# Patient Record
Sex: Female | Born: 1980 | Race: Black or African American | Hispanic: No | Marital: Single | Smoking: Never smoker
Health system: Southern US, Community
[De-identification: ages and names within clinical notes are randomized; demographics above are authoritative.]

## PROBLEM LIST (undated history)

## (undated) DIAGNOSIS — G894 Chronic pain syndrome: Secondary | ICD-10-CM

## (undated) DIAGNOSIS — G709 Myoneural disorder, unspecified: Secondary | ICD-10-CM

## (undated) DIAGNOSIS — E559 Vitamin D deficiency, unspecified: Secondary | ICD-10-CM

## (undated) DIAGNOSIS — E282 Polycystic ovarian syndrome: Secondary | ICD-10-CM

## (undated) DIAGNOSIS — F419 Anxiety disorder, unspecified: Secondary | ICD-10-CM

## (undated) DIAGNOSIS — R569 Unspecified convulsions: Secondary | ICD-10-CM

## (undated) DIAGNOSIS — F4310 Post-traumatic stress disorder, unspecified: Secondary | ICD-10-CM

## (undated) DIAGNOSIS — G43909 Migraine, unspecified, not intractable, without status migrainosus: Secondary | ICD-10-CM

## (undated) DIAGNOSIS — G4730 Sleep apnea, unspecified: Secondary | ICD-10-CM

## (undated) DIAGNOSIS — K219 Gastro-esophageal reflux disease without esophagitis: Secondary | ICD-10-CM

## (undated) DIAGNOSIS — D689 Coagulation defect, unspecified: Secondary | ICD-10-CM

## (undated) DIAGNOSIS — T7840XA Allergy, unspecified, initial encounter: Secondary | ICD-10-CM

## (undated) DIAGNOSIS — J329 Chronic sinusitis, unspecified: Secondary | ICD-10-CM

## (undated) HISTORY — DX: Chronic pain syndrome: G89.4

## (undated) HISTORY — DX: Sleep apnea, unspecified: G47.30

## (undated) HISTORY — DX: Post-traumatic stress disorder, unspecified: F43.10

## (undated) HISTORY — DX: Polycystic ovarian syndrome: E28.2

## (undated) HISTORY — DX: Coagulation defect, unspecified: D68.9

## (undated) HISTORY — DX: Unspecified convulsions: R56.9

## (undated) HISTORY — DX: Myoneural disorder, unspecified: G70.9

## (undated) HISTORY — DX: Anxiety disorder, unspecified: F41.9

## (undated) HISTORY — DX: Gastro-esophageal reflux disease without esophagitis: K21.9

## (undated) HISTORY — PX: WISDOM TOOTH EXTRACTION: SHX21

## (undated) HISTORY — DX: Allergy, unspecified, initial encounter: T78.40XA

## (undated) HISTORY — PX: EXPLORATORY LAPAROTOMY: SUR591

---

## 2008-07-26 ENCOUNTER — Emergency Department (HOSPITAL_COMMUNITY): Admission: EM | Admit: 2008-07-26 | Discharge: 2008-07-27 | Payer: Self-pay | Admitting: Emergency Medicine

## 2009-08-30 ENCOUNTER — Emergency Department (HOSPITAL_COMMUNITY): Admission: EM | Admit: 2009-08-30 | Discharge: 2009-08-30 | Payer: Self-pay | Admitting: Emergency Medicine

## 2010-05-01 LAB — WET PREP, GENITAL
Trich, Wet Prep: NONE SEEN
Yeast Wet Prep HPF POC: NONE SEEN

## 2010-05-01 LAB — URINALYSIS, ROUTINE W REFLEX MICROSCOPIC
Glucose, UA: NEGATIVE mg/dL
Hgb urine dipstick: NEGATIVE
Ketones, ur: NEGATIVE mg/dL
Nitrite: NEGATIVE
Specific Gravity, Urine: 1.021 (ref 1.005–1.030)
Urobilinogen, UA: 0.2 mg/dL (ref 0.0–1.0)
pH: 7 (ref 5.0–8.0)

## 2010-05-01 LAB — DIFFERENTIAL
Basophils Absolute: 0 10*3/uL (ref 0.0–0.1)
Basophils Relative: 1 % (ref 0–1)
Eosinophils Absolute: 0.2 10*3/uL (ref 0.0–0.7)
Neutrophils Relative %: 55 % (ref 43–77)

## 2010-05-01 LAB — POCT PREGNANCY, URINE: Preg Test, Ur: NEGATIVE

## 2010-05-01 LAB — COMPREHENSIVE METABOLIC PANEL
AST: 23 U/L (ref 0–37)
Alkaline Phosphatase: 97 U/L (ref 39–117)
CO2: 26 mEq/L (ref 19–32)
Creatinine, Ser: 0.75 mg/dL (ref 0.4–1.2)
GFR calc Af Amer: >60 mL/min (ref >60)
Total Bilirubin: 0.6 mg/dL (ref 0.3–1.2)
Total Protein: 7.4 g/dL (ref 6.0–8.3)

## 2010-05-01 LAB — CBC
Hemoglobin: 13.5 g/dL (ref 12.0–15.0)
MCV: 87.4 fL (ref 78.0–100.0)
RBC: 4.44 MIL/uL (ref 3.87–5.11)
WBC: 7.2 10*3/uL (ref 4.0–10.5)

## 2010-05-01 LAB — GC/CHLAMYDIA PROBE AMP, GENITAL: GC Probe Amp, Genital: NEGATIVE

## 2012-09-12 DIAGNOSIS — R079 Chest pain, unspecified: Secondary | ICD-10-CM | POA: Insufficient documentation

## 2012-11-27 DIAGNOSIS — E559 Vitamin D deficiency, unspecified: Secondary | ICD-10-CM | POA: Insufficient documentation

## 2013-01-27 DIAGNOSIS — J309 Allergic rhinitis, unspecified: Secondary | ICD-10-CM | POA: Insufficient documentation

## 2013-08-12 DIAGNOSIS — K2950 Unspecified chronic gastritis without bleeding: Secondary | ICD-10-CM | POA: Insufficient documentation

## 2013-11-27 DIAGNOSIS — I951 Orthostatic hypotension: Secondary | ICD-10-CM | POA: Insufficient documentation

## 2014-06-29 DIAGNOSIS — M2550 Pain in unspecified joint: Secondary | ICD-10-CM | POA: Insufficient documentation

## 2014-06-29 DIAGNOSIS — G8929 Other chronic pain: Secondary | ICD-10-CM | POA: Insufficient documentation

## 2014-06-29 DIAGNOSIS — M25511 Pain in right shoulder: Secondary | ICD-10-CM | POA: Insufficient documentation

## 2014-06-29 DIAGNOSIS — M25562 Pain in left knee: Secondary | ICD-10-CM | POA: Insufficient documentation

## 2014-06-29 DIAGNOSIS — M545 Low back pain, unspecified: Secondary | ICD-10-CM | POA: Insufficient documentation

## 2014-07-13 DIAGNOSIS — G4769 Other sleep related movement disorders: Secondary | ICD-10-CM | POA: Insufficient documentation

## 2014-08-07 DIAGNOSIS — M79604 Pain in right leg: Secondary | ICD-10-CM | POA: Insufficient documentation

## 2014-08-07 DIAGNOSIS — M79605 Pain in left leg: Secondary | ICD-10-CM | POA: Insufficient documentation

## 2015-01-12 DIAGNOSIS — R200 Anesthesia of skin: Secondary | ICD-10-CM | POA: Insufficient documentation

## 2015-01-12 DIAGNOSIS — M542 Cervicalgia: Secondary | ICD-10-CM | POA: Insufficient documentation

## 2015-12-09 DIAGNOSIS — E282 Polycystic ovarian syndrome: Secondary | ICD-10-CM | POA: Insufficient documentation

## 2017-05-02 ENCOUNTER — Ambulatory Visit: Payer: BLUE CROSS/BLUE SHIELD | Admitting: Neurology

## 2017-05-30 DIAGNOSIS — R419 Unspecified symptoms and signs involving cognitive functions and awareness: Secondary | ICD-10-CM | POA: Insufficient documentation

## 2017-07-04 ENCOUNTER — Ambulatory Visit: Payer: BLUE CROSS/BLUE SHIELD | Admitting: Neurology

## 2018-03-06 ENCOUNTER — Ambulatory Visit
Admission: EM | Admit: 2018-03-06 | Discharge: 2018-03-06 | Disposition: A | Discharge status: Home / Self Care | Payer: BLUE CROSS/BLUE SHIELD | Attending: Family Medicine | Admitting: Family Medicine

## 2018-03-06 ENCOUNTER — Encounter: Payer: Self-pay | Admitting: Emergency Medicine

## 2018-03-06 DIAGNOSIS — R0982 Postnasal drip: Secondary | ICD-10-CM

## 2018-03-06 DIAGNOSIS — R0981 Nasal congestion: Secondary | ICD-10-CM

## 2018-03-06 DIAGNOSIS — R059 Cough, unspecified: Secondary | ICD-10-CM

## 2018-03-06 DIAGNOSIS — R5383 Other fatigue: Secondary | ICD-10-CM

## 2018-03-06 DIAGNOSIS — R05 Cough: Secondary | ICD-10-CM | POA: Diagnosis not present

## 2018-03-06 DIAGNOSIS — R112 Nausea with vomiting, unspecified: Secondary | ICD-10-CM | POA: Diagnosis not present

## 2018-03-06 DIAGNOSIS — J111 Influenza due to unidentified influenza virus with other respiratory manifestations: Secondary | ICD-10-CM

## 2018-03-06 DIAGNOSIS — R52 Pain, unspecified: Secondary | ICD-10-CM

## 2018-03-06 DIAGNOSIS — R69 Illness, unspecified: Principal | ICD-10-CM

## 2018-03-06 HISTORY — DX: Chronic sinusitis, unspecified: J32.9

## 2018-03-06 HISTORY — DX: Vitamin D deficiency, unspecified: E55.9

## 2018-03-06 HISTORY — DX: Migraine, unspecified, not intractable, without status migrainosus: G43.909

## 2018-03-06 MED ORDER — HYDROCODONE-HOMATROPINE 5-1.5 MG/5ML PO SYRP: 5.0000 mL | ORAL_SOLUTION | Freq: Four times a day (QID) | ORAL | 0 refills | Status: DC | PRN

## 2018-03-06 MED ORDER — ONDANSETRON 4 MG PO TBDP: 4.0000 mg | ORAL_TABLET | Freq: Three times a day (TID) | ORAL | 0 refills | Status: DC | PRN

## 2018-03-06 NOTE — ED Triage Notes (Signed)
Pt presents to [facility] for assessment of cough, congestion since Sunday, developing nausea and emesis Monday, and today developing cold chills.  Pt c/o throat pain as well.

## 2018-03-06 NOTE — Discharge Instructions (Signed)

## 2018-03-06 NOTE — ED Provider Notes (Signed)
[facility]   [id number] 03/06/18 Arrival Time: 1235  ASSESSMENT & PLAN:  1. Influenza-like illness   2. Cough   3. Nausea and vomiting, intractability of vomiting not specified, unspecified vomiting type    See AVS for discharge instructions.  Meds ordered this encounter  Medications  . HYDROcodone-homatropine (HYCODAN) 5-1.5 MG/5ML syrup    Sig: Take 5 mLs by mouth every 6 (six) hours as needed for cough.    Dispense:  90 mL    Refill:  0  . ondansetron (ZOFRAN-ODT) 4 MG disintegrating tablet    Sig: Take 1 tablet (4 mg total) by mouth every 8 (eight) hours as needed for nausea or vomiting.    Dispense:  15 tablet    Refill:  0   Work note provided. Cough medication sedation precautions. Discussed typical duration of symptoms. OTC symptom care as needed. Ensure adequate fluid intake and rest. May f/u with PCP or here as needed.  Reviewed expectations re: course of current medical issues. Questions answered. Outlined signs and symptoms indicating need for more acute intervention. Patient verbalized understanding. After Visit Summary given.   SUBJECTIVE: History from: patient.  Theresa Phillips is a 38 y.o. female who presents with complaint of nasal congestion, post-nasal drainage, and a persistent dry cough; with a mild sore throat. Onset abrupt, 3-4 days ago; with fatigue and with body aches. SOB: none. Wheezing: none. Fever: questions subjective with chills. Overall normal PO intake without n/v. Known sick contacts: no; but has flown recently No specific or significant aggravating or alleviating factors reported. OTC treatment: none reported.   Social History   Tobacco Use  Smoking Status Never Smoker  Smokeless Tobacco Never Used    ROS: As per HPI.  OBJECTIVE:  Vitals:   03/06/18 1244  BP: 126/84  Pulse: (!) 106  Resp: 20  Temp: 98 F (36.7 C)  TempSrc: Oral  SpO2: 96%     General appearance: alert; appears fatigued HEENT: nasal  congestion; clear runny nose; throat irritation secondary to post-nasal drainage Neck: supple without LAD CV: regular; mild tachycardia Lungs: unlabored respirations, symmetrical air entry without wheezing; cough: moderate Abd: soft Ext: no LE edema Skin: warm and dry Psychological: alert and cooperative; normal mood and affect   Allergies  Allergen Reactions  . Clindamycin/Lincomycin Itching  . Oxycodone Itching  . Tylenol [Acetaminophen] Other (See Comments)    Gives me "migraines".  States she can take it mixed with hydrocodone.    Marland Kitchen. Penicillins Rash  . Tramadol Rash    Past Medical History:  Diagnosis Date  . Migraines   . Sinusitis   . Vitamin D deficiency     Social History   Socioeconomic History  . Marital status: Single    Spouse name: Not on file  . Number of children: Not on file  . Years of education: Not on file  . Highest education level: Not on file  Occupational History  . Not on file  Social Needs  . Financial resource strain: Not on file  . Food insecurity:    Worry: Not on file    Inability: Not on file  . Transportation needs:    Medical: Not on file    Non-medical: Not on file  Tobacco Use  . Smoking status: Never Smoker  . Smokeless tobacco: Never Used  Substance and Sexual Activity  . Alcohol use: Not Currently  . Drug use: Never  . Sexual activity: Not on file  Lifestyle  . Physical activity:  Days per week: Not on file    Minutes per session: Not on file  . Stress: Not on file  Relationships  . Social connections:    Talks on phone: Not on file    Gets together: Not on file    Attends religious service: Not on file    Active member of club or organization: Not on file    Attends meetings of clubs or organizations: Not on file    Relationship status: Not on file  . Intimate partner violence:    Fear of current or ex partner: Not on file    Emotionally abused: Not on file    Physically abused: Not on file    Forced sexual  activity: Not on file  Other Topics Concern  . Not on file  Social History Narrative  . Not on file           Mardella Layman, MD 03/06/18 [phone]

## 2018-03-06 NOTE — ED Notes (Signed)
Patient able to ambulate independently  

## 2018-04-07 DIAGNOSIS — E66813 Obesity, class 3: Secondary | ICD-10-CM | POA: Insufficient documentation

## 2019-02-13 DIAGNOSIS — M654 Radial styloid tenosynovitis [de Quervain]: Secondary | ICD-10-CM | POA: Insufficient documentation

## 2019-02-13 DIAGNOSIS — G5603 Carpal tunnel syndrome, bilateral upper limbs: Secondary | ICD-10-CM | POA: Insufficient documentation

## 2019-02-26 DIAGNOSIS — R251 Tremor, unspecified: Secondary | ICD-10-CM | POA: Insufficient documentation

## 2019-02-28 ENCOUNTER — Other Ambulatory Visit: Payer: Self-pay

## 2019-02-28 ENCOUNTER — Other Ambulatory Visit: Payer: Self-pay | Admitting: Physician Assistant

## 2019-02-28 ENCOUNTER — Ambulatory Visit (INDEPENDENT_AMBULATORY_CARE_PROVIDER_SITE_OTHER): Payer: 59

## 2019-02-28 ENCOUNTER — Ambulatory Visit (INDEPENDENT_AMBULATORY_CARE_PROVIDER_SITE_OTHER): Payer: 59 | Admitting: Internal Medicine

## 2019-02-28 ENCOUNTER — Encounter: Payer: Self-pay | Admitting: Internal Medicine

## 2019-02-28 DIAGNOSIS — G4733 Obstructive sleep apnea (adult) (pediatric): Secondary | ICD-10-CM | POA: Diagnosis not present

## 2019-02-28 DIAGNOSIS — R0609 Other forms of dyspnea: Secondary | ICD-10-CM

## 2019-02-28 DIAGNOSIS — R102 Pelvic and perineal pain: Secondary | ICD-10-CM

## 2019-02-28 DIAGNOSIS — R0600 Dyspnea, unspecified: Secondary | ICD-10-CM

## 2019-02-28 DIAGNOSIS — R058 Other specified cough: Secondary | ICD-10-CM

## 2019-02-28 DIAGNOSIS — R05 Cough: Secondary | ICD-10-CM

## 2019-02-28 DIAGNOSIS — Z9989 Dependence on other enabling machines and devices: Secondary | ICD-10-CM

## 2019-02-28 NOTE — Progress Notes (Addendum)
Theresa Phillips, female    DOB: August 10, 1980,   MRN: [id number]   Brief patient profile:  39 yobf born 3 weeks early at 9lb 13 oz but  good ex tol as child, teenager but "always allergy" = Itching sneezing runny nose never allergist rx clariton/allegra always about the same but worse in summer very sedentary and limited by back pain and doe around 2017.   2016 while at conference roommate noted she stopped breathing > neurologist  In [place] >rx cpap feels more rested since started but breathing no better     History of Present Illness  02/28/2019  Pulmonary/ 1st office eval/Ghadeer Kastelic  Chief Complaint  Patient presents with  . Consult    Patient is here for shortness of breath with exertion and daily activies that has got worse over the last year. Patient has sleep apnea. Patient was told befoer she has a touch of asthma but was never really confimed and wasn't treated.   Dyspnea:  MMRC3 = can't walk 100 yards even at a slow pace at a flat grade s stopping due to sob / eg [employer] can just pick up 1-2 items, not shop entire store or carry bags and walk if over 10 lb  Cough: sometimes with ex / dry Sleep: better on cpap, no resp c/o's SABA use: no  No obvious day to day or daytime variability or assoc excess/ purulent sputum or mucus plugs or hemoptysis or cp or chest tightness, subjective wheeze or overt sinus or hb symptoms.   Sleeping as above  without nocturnal  or early am exacerbation  of respiratory  c/o's or need for noct saba. Also denies any obvious fluctuation of symptoms with weather or environmental changes or other aggravating or alleviating factors except as outlined above   No unusual exposure hx or h/o childhood pna/ asthma or knowledge of premature birth.  Current Allergies, Complete Past Medical History, Past Surgical History, Family History, and Social History were reviewed in [employer] record.  ROS  The following are not active complaints unless  bolded Hoarseness, sore throat, dysphagia, dental problems, itching, sneezing,  nasal congestion or discharge of excess mucus or purulent secretions, ear ache,   fever, chills, sweats, unintended wt loss or wt gain, classically pleuritic or exertional cp,  orthopnea pnd or arm/hand swelling  or leg swelling, presyncope, palpitations, abdominal pain, anorexia, nausea, vomiting, diarrhea  or change in bowel habits or change in bladder habits, change in stools or change in urine, dysuria, hematuria,  rash, arthralgias, visual complaints, headache, numbness, weakness or ataxia or problems with walking or coordination,  change in mood or  memory.             Past Medical History:  Diagnosis Date  . Migraines   . Sinusitis   . Vitamin D deficiency     Outpatient Medications Prior to Visit  Medication Sig Dispense Refill  . eltrombopag (PROMACTA) 12.5 MG tablet Take 12.5 mg by mouth as needed. Take on an empty stomach, 1 hour before a meal or 2 hours after.    . ergocalciferol (VITAMIN D2) 1.25 MG (50000 UT) capsule Take 1 capsule by mouth once a week.    . fluticasone (FLONASE) 50 MCG/ACT nasal spray Place 2 sprays into both nostrils daily.    Marland Kitchen HYDROcodone-homatropine (HYCODAN) 5-1.5 MG/5ML syrup Take 5 mLs by mouth every 6 (six) hours as needed for cough. 90 mL 0  . levocetirizine (XYZAL) 5 MG tablet Take 5 mg  by mouth daily.    . norgestrel-ethinyl estradiol (LO/OVRAL) 0.3-30 MG-MCG tablet Take 1 tablet by mouth daily.    Marland Kitchen omeprazole (PRILOSEC) 40 MG capsule Take 40 mg by mouth daily.    . ondansetron (ZOFRAN-ODT) 4 MG disintegrating tablet Take 1 tablet (4 mg total) by mouth every 8 (eight) hours as needed for nausea or vomiting. 15 tablet 0  . RIMEGEPANT SULFATE PO Take 1 tablet by mouth as needed.    . topiramate (TOPAMAX) 50 MG tablet Take 50 mg by mouth 2 (two) times daily. Patient is on incline to increase to 14m. Started 02/18/2019    . amitriptyline (ELAVIL) 50 MG tablet Take 50 mg  by mouth at bedtime.        Objective:     BP 110/80 (BP Location: Left Arm, Cuff Size: Large)   Pulse 89   SpO2 98% Comment: on RA  SpO2: 98 %(on RA)   Obese bf nad    HEENT : pt wearing mask not removed for exam due to covid -19 concerns.    NECK :  without JVD/Nodes/TM/ nl carotid upstrokes bilaterally   LUNGS: no acc muscle use,  Nl contour chest which is clear to A and P bilaterally without cough on insp or exp maneuvers   CV:  RRR  no s3 or murmur or increase in P2, and no edema   ABD:  Obese soft and nontender with very poor  inspiratory excursion in the supine position. No bruits or organomegaly appreciated, bowel sounds nl  MS:  Nl gait/ ext warm without deformities, calf tenderness, cyanosis or clubbing No obvious joint restrictions   SKIN: warm and dry without lesions    NEURO:  alert, approp, nl sensorium with  no motor or cerebellar deficits apparent.   Labs  [facility] blood work one week prior to OV  > requested  Add:  Labs 02/19/19  TSH 2.2,  ESR 36  - no cbc or bmet    CXR PA and Lateral:   02/28/2019 :    I personally reviewed images and agree with radiology impression as follows:    No active cardiopulmonary disease.      Assessment   DOE (dyspnea on exertion) Onset 2017  - 02/28/2019   Walked RA x two laps =  approx 5046f@ slow pace - stopped due to end of study, min sob  with sats of 100 % at the end of the study.  Symptoms are markedly disproportionate to objective findings and not clear to what extent this is actually a pulmonary  problem but pt does appear to have difficult to sort out respiratory symptoms of unknown origin for which  DDX  = almost all start with A and  include Adherence, Ace Inhibitors, Acid Reflux, Active Sinus Disease, Alpha 1 Antitripsin deficiency, Anxiety masquerading as Airways dz,  ABPA,  Allergy(esp in young), Aspiration (esp in elderly), Adverse effects of meds,  Active smoking or Vaping, A bunch of PE's/clot burden (a  few small clots can't cause this syndrome unless there is already severe underlying pulm or vascular dz with poor reserve),  Anemia or thyroid disorder, plus two Bs  = Bronchiectasis and Beta blocker use..and one C= CHF     Adherence is always the initial "prime suspect" and is a multilayered concern that requires a "trust but verify" approach in every patient - starting with knowing how to use medications, especially inhalers, correctly, keeping up with refills and understanding the fundamental difference between maintenance  and prns vs those medications only taken for a very short course and then stopped and not refilled.  - return with all meds in hand using a trust but verify approach to confirm accurate Medication  Reconciliation The principal here is that until we are certain that the  patients are doing what we've asked, it makes no sense to ask them to do more.   ? Acid (or non-acid) GERD > always difficult to exclude as up to 75% of pts in some series report no assoc GI/ Heartburn symptoms and she is on progesterone/ MO which can synergistically overcome nl LES fxn > rec max (24h)  acid suppression and diet restrictions/ reviewed and instructions given in writing.   ? Anxiety/depression/ deconditioning  > usually at the bottom of  list of usual suspects but   may interfere with adherence and also interpretation of response or lack thereof to symptom management which can be quite subjective.  >>> reconditioning ex discussed   ? Active sinus dz/ rhinitis > continue flonase/ xyzal > consider formal allergy eval but return with pfts first.  ? Adverse drug effects:  bcp's at age 39 risk dvt/GERD/ advised   ? A bunch of PE's > at risk though nothing clinically to suggest    >>> Will return in 3 months with pfts, call sooner if any acute changes         Upper airway cough syndrome/ PNDS ? allergic rhinitis  Onset as teenager, partially responsive to otcs  Upper airway cough syndrome  (previously labeled PNDS),  is so named because it's frequently impossible to sort out how much is  CR/sinusitis with freq throat clearing (which can be related to primary GERD)   vs  causing  secondary (" extra esophageal")  GERD from wide swings in gastric pressure that occur with throat clearing, often  promoting self use of mint and menthol lozenges that reduce the lower esophageal sphincter tone and exacerbate the problem further in a cyclical fashion.   These are the same pts (now being labeled as having "irritable larynx syndrome" by some cough centers) who not infrequently have a history of having failed to tolerate ace inhibitors,  dry powder inhalers or biphosphonates or report having atypical/extraesophageal reflux symptoms that don't respond to standard doses of PPI  and are easily confused as having aecopd or asthma flares by even experienced allergists/ pulmonologists (myself included).    >>>> No change rx for now, rx GERD and then consider allergy eval next    OSA on CPAP rx per [facility] neurologist since 2016   Etiology and pathophysiology of osa including relationship to obesity reviewed in detail    Appears to be well compensated but explained the problem with wt on top of diaphragm  >>>>rec  would be better if used bed blocks as well as continue cpap/ wt loss    Morbid obesity due to excess calories c/b OSA Body mass index is 47.4 kg/m.    TSH  02/19/19  = 2.2   Contributing to gerd risk/ doe/reviewed the need and the process to achieve and maintain neg calorie balance > defer f/u primary care including intermittently monitoring thyroid status      Total time devoted to counseling  > 50 % of initial 60 min office visit:  reviewed case with pt/ directly observd  portions of ambulatory 02 saturation study/  discussion of options/alternatives/ personally creating written customized instructions  in presence of pt  then going over those specific  Instructions directly with  the pt including how to use all of the meds but in particular covering each new medication in detail and the difference between the maintenance= "automatic" meds and the prns using an action plan format for the latter (If this problem/symptom => do that organization reading Left to right).  Please see AVS from this visit for a full list of these instructions which I personally wrote for this pt and  are unique to this visit.    Christinia Gully, MD 02/28/2019

## 2019-02-28 NOTE — Patient Instructions (Signed)
GERD (REFLUX)  is an extremely common cause of respiratory symptoms just like yours , many times with no obvious heartburn at all.    It can be treated with medication, but also with lifestyle changes including elevation of the head of your bed (ideally with 6 -8inch blocks under the headboard of your bed),  Smoking cessation, avoidance of late meals, excessive alcohol, and avoid fatty foods, chocolate, peppermint, colas, red wine, and acidic juices such as orange juice.  NO MINT OR MENTHOL PRODUCTS SO NO COUGH DROPS  USE SUGARLESS CANDY INSTEAD (Jolley ranchers or Stover's or Life Savers) or even ice chips will also do - the key is to swallow to prevent all throat clearing. NO OIL BASED VITAMINS - use powdered substitutes.  Avoid fish oil when coughing.    Please remember to go to the  x-ray department  for your tests - we will call you with the results when they are available     Please schedule a follow up visit in 3 months but call sooner if needed - pft's on return

## 2019-03-01 ENCOUNTER — Encounter: Payer: Self-pay | Admitting: Internal Medicine

## 2019-03-01 DIAGNOSIS — R058 Other specified cough: Secondary | ICD-10-CM | POA: Insufficient documentation

## 2019-03-01 DIAGNOSIS — Z9989 Dependence on other enabling machines and devices: Secondary | ICD-10-CM | POA: Insufficient documentation

## 2019-03-01 DIAGNOSIS — R05 Cough: Secondary | ICD-10-CM | POA: Insufficient documentation

## 2019-03-01 DIAGNOSIS — G4733 Obstructive sleep apnea (adult) (pediatric): Secondary | ICD-10-CM | POA: Insufficient documentation

## 2019-03-01 NOTE — Assessment & Plan Note (Signed)
Onset as teenager, partially responsive to otcs  Upper airway cough syndrome (previously labeled PNDS),  is so named because it's frequently impossible to sort out how much is  CR/sinusitis with freq throat clearing (which can be related to primary GERD)   vs  causing  secondary (" extra esophageal")  GERD from wide swings in gastric pressure that occur with throat clearing, often  promoting self use of mint and menthol lozenges that reduce the lower esophageal sphincter tone and exacerbate the problem further in a cyclical fashion.   These are the same pts (now being labeled as having "irritable larynx syndrome" by some cough centers) who not infrequently have a history of having failed to tolerate ace inhibitors,  dry powder inhalers or biphosphonates or report having atypical/extraesophageal reflux symptoms that don't respond to standard doses of PPI  and are easily confused as having aecopd or asthma flares by even experienced allergists/ pulmonologists (myself included).    No change rx for now, rx GERD and then consider allergy eval next

## 2019-03-01 NOTE — Assessment & Plan Note (Addendum)
rx per [facility] neurologist since 2016   Etiology and pathophysiology of osa including relationship to obesity reviewed in detail    Appears to be well compensated but explained the problem with wt on top of diaphragm  >>>>rec  would be better if used bed blocks as well as continue cpap/ wt loss   Total time devoted to counseling  > 50 % of initial 60 min office visit:  reviewed case with pt/ directly observd  portions of ambulatory 02 saturation study/  discussion of options/alternatives/ personally creating written customized instructions  in presence of pt  then going over those specific  Instructions directly with the pt including how to use all of the meds but in particular covering each new medication in detail and the difference between the maintenance= "automatic" meds and the prns using an action plan format for the latter (If this problem/symptom => do that organization reading Left to right).  Please see AVS from this visit for a full list of these instructions which I personally wrote for this pt and  are unique to this visit.

## 2019-03-01 NOTE — Assessment & Plan Note (Addendum)
Onset 2017  - 02/28/2019   Walked RA x two laps =  approx 564ft @ slow pace - stopped due to end of study, min sob  with sats of 100 % at the end of the study.  Symptoms are markedly disproportionate to objective findings and not clear to what extent this is actually a pulmonary  problem but pt does appear to have difficult to sort out respiratory symptoms of unknown origin for which  DDX  = almost all start with A and  include Adherence, Ace Inhibitors, Acid Reflux, Active Sinus Disease, Alpha 1 Antitripsin deficiency, Anxiety masquerading as Airways dz,  ABPA,  Allergy(esp in young), Aspiration (esp in elderly), Adverse effects of meds,  Active smoking or Vaping, A bunch of PE's/clot burden (a few small clots can't cause this syndrome unless there is already severe underlying pulm or vascular dz with poor reserve),  Anemia or thyroid disorder, plus two Bs  = Bronchiectasis and Beta blocker use..and one C= CHF     Adherence is always the initial "prime suspect" and is a multilayered concern that requires a "trust but verify" approach in every patient - starting with knowing how to use medications, especially inhalers, correctly, keeping up with refills and understanding the fundamental difference between maintenance and prns vs those medications only taken for a very short course and then stopped and not refilled.  - return with all meds in hand using a trust but verify approach to confirm accurate Medication  Reconciliation The principal here is that until we are certain that the  patients are doing what we've asked, it makes no sense to ask them to do more.   ? Acid (or non-acid) GERD > always difficult to exclude as up to 75% of pts in some series report no assoc GI/ Heartburn symptoms and she is on progesterone/ MO which can synergistically overcome nl LES fxn > rec max (24h)  acid suppression and diet restrictions/ reviewed and instructions given in writing.   ? Anxiety/depression/ deconditioning  >  usually at the bottom of  list of usual suspects but   may interfere with adherence and also interpretation of response or lack thereof to symptom management which can be quite subjective.  >>> reconditioning ex discussed   ? Active sinus dz/ rhinitis > continue flonase/ xyzal > consider formal allergy eval but return with pfts first.  ? Adverse drug effects:  bcp's at age 39 risk dvt/GERD/ advised   ? A bunch of PE's > at risk though nothing clinically to suggest    >>> Will return in 3 months with pfts, call sooner if any acute changes

## 2019-03-01 NOTE — Assessment & Plan Note (Addendum)
Body mass index is 47.4 kg/m.    TSH  02/19/19  = 2.2   Contributing to gerd risk/ doe/reviewed the need and the process to achieve and maintain neg calorie balance > defer f/u primary care including intermittently monitoring thyroid status

## 2019-03-03 NOTE — Progress Notes (Signed)
Left detailed msg on VM ok per DPR

## 2019-03-13 ENCOUNTER — Other Ambulatory Visit: Payer: 59

## 2019-03-19 ENCOUNTER — Ambulatory Visit
Admission: RE | Admit: 2019-03-19 | Discharge: 2019-03-19 | Disposition: A | Discharge status: Home / Self Care | Payer: 59 | Source: Ambulatory Visit | Attending: Physician Assistant | Admitting: Physician Assistant

## 2019-03-19 DIAGNOSIS — R102 Pelvic and perineal pain: Secondary | ICD-10-CM

## 2019-03-19 MED ORDER — IOPAMIDOL (ISOVUE-300) INJECTION 61%
100.0000 mL | Freq: Once | INTRAVENOUS | Status: AC | PRN
  Administered 2019-03-19: 14:00:00 100 mL via INTRAVENOUS

## 2019-03-20 DIAGNOSIS — G43909 Migraine, unspecified, not intractable, without status migrainosus: Secondary | ICD-10-CM | POA: Insufficient documentation

## 2019-04-04 DIAGNOSIS — G5601 Carpal tunnel syndrome, right upper limb: Secondary | ICD-10-CM | POA: Insufficient documentation

## 2019-04-04 DIAGNOSIS — G5602 Carpal tunnel syndrome, left upper limb: Secondary | ICD-10-CM | POA: Insufficient documentation

## 2019-04-15 ENCOUNTER — Other Ambulatory Visit: Payer: Self-pay | Admitting: Gastroenterology

## 2019-04-15 DIAGNOSIS — Q451 Annular pancreas: Secondary | ICD-10-CM

## 2019-04-24 ENCOUNTER — Other Ambulatory Visit: Payer: 59

## 2019-05-05 ENCOUNTER — Other Ambulatory Visit: Payer: 59

## 2019-05-05 ENCOUNTER — Other Ambulatory Visit: Payer: Self-pay | Admitting: Gastroenterology

## 2019-05-05 DIAGNOSIS — R109 Unspecified abdominal pain: Secondary | ICD-10-CM

## 2019-05-05 DIAGNOSIS — K219 Gastro-esophageal reflux disease without esophagitis: Secondary | ICD-10-CM

## 2019-05-16 ENCOUNTER — Other Ambulatory Visit (HOSPITAL_COMMUNITY): Payer: Self-pay | Admitting: Gastroenterology

## 2019-05-16 ENCOUNTER — Ambulatory Visit
Admission: RE | Admit: 2019-05-16 | Discharge: 2019-05-16 | Disposition: A | Discharge status: Home / Self Care | Payer: 59 | Source: Ambulatory Visit | Attending: Gastroenterology | Admitting: Gastroenterology

## 2019-05-16 DIAGNOSIS — R109 Unspecified abdominal pain: Secondary | ICD-10-CM

## 2019-05-16 DIAGNOSIS — K219 Gastro-esophageal reflux disease without esophagitis: Secondary | ICD-10-CM

## 2019-05-20 ENCOUNTER — Other Ambulatory Visit (HOSPITAL_COMMUNITY): Payer: Self-pay | Admitting: Gastroenterology

## 2019-05-20 ENCOUNTER — Other Ambulatory Visit: Payer: Self-pay

## 2019-05-20 ENCOUNTER — Ambulatory Visit (HOSPITAL_COMMUNITY)
Admission: RE | Admit: 2019-05-20 | Discharge: 2019-05-20 | Disposition: A | Discharge status: Home / Self Care | Payer: 59 | Source: Ambulatory Visit | Attending: Gastroenterology | Admitting: Gastroenterology

## 2019-05-20 DIAGNOSIS — K219 Gastro-esophageal reflux disease without esophagitis: Secondary | ICD-10-CM | POA: Insufficient documentation

## 2019-05-20 DIAGNOSIS — R109 Unspecified abdominal pain: Secondary | ICD-10-CM | POA: Diagnosis present

## 2019-05-23 ENCOUNTER — Other Ambulatory Visit: Payer: 59

## 2019-05-28 ENCOUNTER — Ambulatory Visit: Payer: 59 | Admitting: Internal Medicine

## 2019-05-30 ENCOUNTER — Ambulatory Visit: Payer: 59 | Admitting: Internal Medicine

## 2019-06-03 ENCOUNTER — Ambulatory Visit: Payer: 59 | Admitting: Internal Medicine

## 2019-06-16 ENCOUNTER — Other Ambulatory Visit: Payer: Self-pay | Admitting: Gastroenterology

## 2019-06-16 ENCOUNTER — Other Ambulatory Visit (HOSPITAL_COMMUNITY): Payer: Self-pay | Admitting: Gastroenterology

## 2019-06-16 DIAGNOSIS — R1111 Vomiting without nausea: Secondary | ICD-10-CM

## 2019-06-26 ENCOUNTER — Encounter (HOSPITAL_COMMUNITY)
Admission: RE | Admit: 2019-06-26 | Discharge: 2019-06-26 | Disposition: A | Discharge status: Home / Self Care | Payer: 59 | Source: Ambulatory Visit | Attending: Gastroenterology | Admitting: Gastroenterology

## 2019-06-26 ENCOUNTER — Other Ambulatory Visit: Payer: Self-pay

## 2019-06-26 DIAGNOSIS — R1111 Vomiting without nausea: Secondary | ICD-10-CM

## 2019-06-26 MED ORDER — TECHNETIUM TC 99M SULFUR COLLOID
2.0000 | Freq: Once | INTRAVENOUS | Status: AC | PRN
  Administered 2019-06-26: 2 via ORAL

## 2019-08-27 ENCOUNTER — Ambulatory Visit (INDEPENDENT_AMBULATORY_CARE_PROVIDER_SITE_OTHER): Payer: 59 | Admitting: Psychology

## 2019-08-27 DIAGNOSIS — F411 Generalized anxiety disorder: Secondary | ICD-10-CM | POA: Diagnosis not present

## 2019-09-03 ENCOUNTER — Ambulatory Visit (INDEPENDENT_AMBULATORY_CARE_PROVIDER_SITE_OTHER): Payer: 59 | Admitting: Psychology

## 2019-09-03 DIAGNOSIS — F411 Generalized anxiety disorder: Secondary | ICD-10-CM | POA: Diagnosis not present

## 2019-09-10 ENCOUNTER — Ambulatory Visit: Payer: 59 | Admitting: Psychology

## 2019-10-30 DIAGNOSIS — M5459 Other low back pain: Secondary | ICD-10-CM | POA: Insufficient documentation

## 2019-10-30 DIAGNOSIS — M51369 Other intervertebral disc degeneration, lumbar region without mention of lumbar back pain or lower extremity pain: Secondary | ICD-10-CM | POA: Insufficient documentation

## 2019-10-30 DIAGNOSIS — G894 Chronic pain syndrome: Secondary | ICD-10-CM | POA: Insufficient documentation

## 2019-10-30 DIAGNOSIS — M5416 Radiculopathy, lumbar region: Secondary | ICD-10-CM | POA: Insufficient documentation

## 2019-10-30 DIAGNOSIS — G8929 Other chronic pain: Secondary | ICD-10-CM | POA: Insufficient documentation

## 2019-10-30 DIAGNOSIS — M5136 Other intervertebral disc degeneration, lumbar region: Secondary | ICD-10-CM | POA: Insufficient documentation

## 2019-11-03 DIAGNOSIS — R519 Headache, unspecified: Secondary | ICD-10-CM | POA: Insufficient documentation

## 2019-12-10 DIAGNOSIS — G43711 Chronic migraine without aura, intractable, with status migrainosus: Secondary | ICD-10-CM | POA: Insufficient documentation

## 2020-01-08 ENCOUNTER — Encounter: Payer: Self-pay | Admitting: Podiatry

## 2020-01-08 ENCOUNTER — Ambulatory Visit (INDEPENDENT_AMBULATORY_CARE_PROVIDER_SITE_OTHER): Payer: 59 | Admitting: Podiatry

## 2020-01-08 ENCOUNTER — Other Ambulatory Visit: Payer: Self-pay

## 2020-01-08 DIAGNOSIS — L600 Ingrowing nail: Secondary | ICD-10-CM | POA: Diagnosis not present

## 2020-01-08 DIAGNOSIS — M79676 Pain in unspecified toe(s): Secondary | ICD-10-CM | POA: Diagnosis not present

## 2020-01-08 NOTE — Progress Notes (Signed)
  Subjective:  Patient ID: Theresa Phillips, female    DOB: January 22, 1981,  MRN: [id number]  Chief Complaint  Patient presents with  . Nail Problem    I have some ingrown toenails on the big toes and the 2nd toes     39 y.o. female presents with the above complaint. History confirmed with patient. States these happen often and cause her pain.  Objective:  Physical Exam: warm, good capillary refill, no trophic changes or ulcerative lesions, normal DP and PT pulses and normal sensory exam.  Painful ingrowing nail at both borders of the left, right, hallux; without warmth, erythema or drainage  Assessment:   1. Ingrown nail   2. Pain around toenail    Plan:  Patient was evaluated and treated and all questions answered.  Ingrown Nail, bilateral -Nails debrided in slant-back fashion bilat. -Will plan for ingrown nail procedure in 2 weeks.   Return in about 2 weeks (around 01/22/2020) for Ingrown nail procedure both great toes.

## 2020-01-22 ENCOUNTER — Other Ambulatory Visit: Payer: Self-pay

## 2020-01-22 ENCOUNTER — Encounter: Payer: Self-pay | Admitting: Podiatry

## 2020-01-22 ENCOUNTER — Ambulatory Visit (INDEPENDENT_AMBULATORY_CARE_PROVIDER_SITE_OTHER): Payer: 59 | Admitting: Podiatry

## 2020-01-22 DIAGNOSIS — L600 Ingrowing nail: Secondary | ICD-10-CM | POA: Diagnosis not present

## 2020-01-22 MED ORDER — NEOMYCIN-POLYMYXIN-HC 3.5-10000-1 OT SOLN: OTIC | 0 refills | Status: DC

## 2020-01-22 NOTE — Progress Notes (Signed)
  Subjective:  Patient ID: Theresa Phillips, female    DOB: 1980/02/24,  MRN: [id number]  Chief Complaint  Patient presents with  . Nail Problem    I have some ingrowns that Dr Samuella Cota is going to take out today on the two big toenails     39 y.o. female presents with the above complaint. History confirmed with patient.   Objective:  Physical Exam: warm, good capillary refill, no trophic changes or ulcerative lesions, normal DP and PT pulses and normal sensory exam.  Painful ingrowing nail at both borders of the left, right, hallux, medial border both 2nd toes without warmth, erythema or drainage  Assessment:   1. Ingrown nail      Plan:  Patient was evaluated and treated and all questions answered.  Ingrown Nail, bilateral -Patient elects to proceed with ingrown toenail removal today -Ingrown nail excised. See procedure note. -Educated on post-procedure care including soaking. Written instructions provided. -Rx for Cortisporin drops -Patient to follow up in 2 weeks for nail check.  Procedure: Excision of Ingrown Toenail Location: Bilateral 1st toe both borders, bilat 2nd toe medial borders. Anesthesia: Lidocaine 2% plain; 61mL and Marcaine 0.5% plain; 96mL, digital block. Skin Prep: Betadine. Dressing: Silvadene; telfa; dry, sterile, compression dressing. Technique: Following skin prep, the toe was exsanguinated and a tourniquet was secured at the base of the toe. The affected nail border was freed, split with a nail splitter, and excised. Chemical matrixectomy was then performed with phenol and irrigated out with alcohol. The tourniquet was then removed and sterile dressing applied. Disposition: Patient tolerated procedure well. Patient to return in 2 weeks for follow-up.  Return in about 2 weeks (around 02/05/2020) for Nail Check.

## 2020-01-22 NOTE — Patient Instructions (Signed)

## 2020-02-05 ENCOUNTER — Ambulatory Visit: Payer: 59 | Admitting: Podiatry

## 2020-02-05 ENCOUNTER — Other Ambulatory Visit: Payer: Self-pay

## 2020-02-05 DIAGNOSIS — L600 Ingrowing nail: Secondary | ICD-10-CM | POA: Diagnosis not present

## 2020-02-05 DIAGNOSIS — M79676 Pain in unspecified toe(s): Secondary | ICD-10-CM | POA: Diagnosis not present

## 2020-02-05 NOTE — Progress Notes (Signed)
  Subjective:  Patient ID: Theresa Phillips, female    DOB: Sep 08, 1980,  MRN: [id number]  No chief complaint on file.   40 y.o. female presents for follow up of nail procedure. History confirmed with patient. States the nails are a little tender but denies new issues. Denies warmth, redness, drainage.  Objective:  Physical Exam: Ingrown nail avulsion sites: overlying soft crust, no warmth, no drainage and no erythema Assessment:   1. Ingrown nail   2. Pain around toenail      Plan:  Patient was evaluated and treated and all questions answered.  S/p Ingrown Toenail Excision, bilateral -Healing well without issue. -Gently curettaged nail borders. -Discussed return precautions. -F/u PRN

## 2020-02-17 DIAGNOSIS — M47816 Spondylosis without myelopathy or radiculopathy, lumbar region: Secondary | ICD-10-CM | POA: Insufficient documentation

## 2020-02-20 ENCOUNTER — Telehealth: Payer: Self-pay | Admitting: *Deleted

## 2020-02-20 NOTE — Telephone Encounter (Signed)
The patient called and left a message stating that the left big toe is good and the right big toe has a dark spot and is tender and swelling and some what red and I stated to soak in betadine over the weekend and to call us Monday to let us know how the toe is doing. Misty Stanley

## 2020-03-05 DIAGNOSIS — L83 Acanthosis nigricans: Secondary | ICD-10-CM | POA: Insufficient documentation

## 2020-03-05 DIAGNOSIS — K589 Irritable bowel syndrome without diarrhea: Secondary | ICD-10-CM | POA: Insufficient documentation

## 2020-03-05 DIAGNOSIS — K5900 Constipation, unspecified: Secondary | ICD-10-CM | POA: Insufficient documentation

## 2020-03-05 DIAGNOSIS — M5430 Sciatica, unspecified side: Secondary | ICD-10-CM | POA: Insufficient documentation

## 2020-04-06 ENCOUNTER — Telehealth: Payer: Self-pay | Admitting: *Deleted

## 2020-04-06 NOTE — Telephone Encounter (Signed)
Called and spoke with the patient and stated that patient has an appointment on Thursday march 17th, 2022 at 11:00 am and per Dr Samuella Cota can soak in epsom salt and patient stated that it was a little touchy at times and I stated that patient could try some betadine and use just a little of that and a bad aid and see how that feels until the appointment time. Theresa Phillips

## 2020-04-06 NOTE — Telephone Encounter (Signed)
-----   Message from Park Liter, DPM sent at 04/02/2020  5:08 PM EST ----- Soak it in water and Epsom salt and make an appt to see me ----- Message ----- From: Lanney Gins, [facility] Sent: 04/02/2020   1:08 PM EST To: Park Liter, DPM  Patient is called to see what she can do about the pain in her toenails and patient does have an appointment to see yo. Misty Stanley

## 2020-04-08 ENCOUNTER — Other Ambulatory Visit: Payer: Self-pay

## 2020-04-08 ENCOUNTER — Ambulatory Visit (INDEPENDENT_AMBULATORY_CARE_PROVIDER_SITE_OTHER): Payer: 59 | Admitting: Podiatry

## 2020-04-08 DIAGNOSIS — M79676 Pain in unspecified toe(s): Secondary | ICD-10-CM | POA: Diagnosis not present

## 2020-04-08 DIAGNOSIS — L600 Ingrowing nail: Secondary | ICD-10-CM

## 2020-04-08 NOTE — Progress Notes (Signed)
  Subjective:  Patient ID: Theresa Phillips, female    DOB: [birth year],  MRN: [id number]  No chief complaint on file.   40 y.o. female presents with the above complaint. History confirmed with patient. States the right great toe hurts at both sides, also inside of the right 2nd toe. Both nails have pain on the top when squeezed.   Objective:  Physical Exam: warm, good capillary refill, no trophic changes or ulcerative lesions, normal DP and PT pulses and normal sensory exam.  Painful ingrowing nail at both borders of the right, hallux; medial right 2nd toe without warmth, erythema or drainage  Assessment:   1. Ingrown nail   2. Pain around toenail    Plan:  Patient was evaluated and treated and all questions answered.  Ingrown Nail, right -Palliative debridement of ingrowing nails in slant-back fashion to patient relief.  -Nails should grow out more normally with time.  -F/u should issues persist  Return if symptoms worsen or fail to improve.

## 2020-05-13 DIAGNOSIS — R110 Nausea: Secondary | ICD-10-CM | POA: Insufficient documentation

## 2020-06-09 ENCOUNTER — Other Ambulatory Visit: Payer: Self-pay | Admitting: Gastroenterology

## 2020-06-09 DIAGNOSIS — Q451 Annular pancreas: Secondary | ICD-10-CM

## 2020-06-10 ENCOUNTER — Other Ambulatory Visit: Payer: Self-pay

## 2020-06-10 ENCOUNTER — Encounter: Payer: Self-pay | Admitting: Podiatry

## 2020-06-10 ENCOUNTER — Ambulatory Visit (INDEPENDENT_AMBULATORY_CARE_PROVIDER_SITE_OTHER): Payer: 59 | Admitting: Podiatry

## 2020-06-10 DIAGNOSIS — L600 Ingrowing nail: Secondary | ICD-10-CM

## 2020-06-10 DIAGNOSIS — M79676 Pain in unspecified toe(s): Secondary | ICD-10-CM

## 2020-06-10 NOTE — Progress Notes (Signed)
  Subjective:  Patient ID: Theresa Phillips, female    DOB: 1980-09-05,  MRN: [id number]  Chief Complaint  Patient presents with  . Nail Problem    The right big toe is tender more so on the right side than the left side   40 y.o. female presents with the above complaint. History confirmed with patient.   Objective:  Physical Exam: warm, good capillary refill, no trophic changes or ulcerative lesions, normal DP and PT pulses and normal sensory exam.  Painful ingrowing nail at  medial border of the right, hallux; without warmth, erythema or drainage  Assessment:   1. Pain around toenail   2. Ingrown nail    Plan:  Patient was evaluated and treated and all questions answered.  Ingrown Nail, right -Patient elects to proceed with ingrown toenail removal today -Ingrown nail excised. See procedure note. -Educated on post-procedure care including soaking. Written instructions provided.  Procedure: Excision of Ingrown Toenail Location: Right 1st toe  medial border Anesthesia: Lidocaine 1% plain; 38mL, digital block. Skin Prep: Betadine. Dressing: Silvadene; telfa; dry, sterile, compression dressing. Technique: Following skin prep, the toe was exsanguinated and a tourniquet was secured at the base of the toe. The affected nail border was freed, split with a nail splitter, and excised. Chemical matrixectomy was then performed with phenol and irrigated out with alcohol. The tourniquet was then removed and sterile dressing applied. Disposition: Patient tolerated procedure well. Patient to return in 2 weeks for follow-up.  Return in about 2 weeks (around 06/24/2020) for nail check.

## 2020-06-10 NOTE — Patient Instructions (Signed)

## 2020-06-28 ENCOUNTER — Other Ambulatory Visit: Payer: Self-pay

## 2020-06-28 ENCOUNTER — Ambulatory Visit (INDEPENDENT_AMBULATORY_CARE_PROVIDER_SITE_OTHER): Payer: 59 | Admitting: Podiatry

## 2020-06-28 DIAGNOSIS — M79676 Pain in unspecified toe(s): Secondary | ICD-10-CM

## 2020-06-28 DIAGNOSIS — L600 Ingrowing nail: Secondary | ICD-10-CM | POA: Diagnosis not present

## 2020-06-28 NOTE — Progress Notes (Signed)
  Subjective:  Patient ID: Theresa Phillips, female    DOB: 07-28-1980,  MRN: [id number]  Chief Complaint  Patient presents with  . nail check    F/U Rt hallux nail check -pt states," looking better but it hurts a lot at times; 10/10 at times or 3/10 ." - no redness/swelling/drainage     40 y.o. female presents for follow up of nail procedure. History confirmed with patient.   Objective:  Physical Exam: Ingrown nail avulsion site: overlying soft crust, no warmth, no drainage and no erythema Assessment:   1. Ingrown nail   2. Pain around toenail    Plan:  Patient was evaluated and treated and all questions answered.  S/p Ingrown Toenail Excision, right -Healing well without issue. -Debrided scabbing and ingrowing aspects of 2nd toenails bilat. -Discussed return precautions. -F/u PRN

## 2020-10-25 ENCOUNTER — Ambulatory Visit (INDEPENDENT_AMBULATORY_CARE_PROVIDER_SITE_OTHER): Payer: 59 | Admitting: Podiatry

## 2020-10-25 ENCOUNTER — Other Ambulatory Visit: Payer: Self-pay

## 2020-10-25 DIAGNOSIS — L600 Ingrowing nail: Secondary | ICD-10-CM | POA: Diagnosis not present

## 2020-10-25 DIAGNOSIS — M79676 Pain in unspecified toe(s): Secondary | ICD-10-CM

## 2020-11-01 NOTE — Progress Notes (Signed)
  Subjective:  Patient ID: Theresa Phillips, female    DOB: 1980-07-23,  MRN: [id number]  Chief Complaint  Patient presents with   nail check    F/U BL nail check -pt states," the Lt is okay the Rt is about the same as last time. At times with pain at whole toe; 8/10 at it's worse." - no redness/swelling tx: none   40 y.o. female presents with the above complaint. History confirmed with patient.   Objective:  Physical Exam: warm, good capillary refill, no trophic changes or ulcerative lesions, normal DP and PT pulses, and normal sensory exam.  Painful ingrowing nail at both borders of the right, hallux; without warmth, erythema or drainage  Assessment:   1. Ingrown nail   2. Pain around toenail      Plan:  Patient was evaluated and treated and all questions answered.  Ingrown Nail, bilateral -Palliative debridement of ingrowing nails in slant-back fashion to patient relief. Would consider full excision if issues persist.  No follow-ups on file.   MDM

## 2020-12-01 DIAGNOSIS — L0293 Carbuncle, unspecified: Secondary | ICD-10-CM | POA: Insufficient documentation

## 2021-01-19 DIAGNOSIS — G509 Disorder of trigeminal nerve, unspecified: Secondary | ICD-10-CM | POA: Insufficient documentation

## 2021-06-14 ENCOUNTER — Other Ambulatory Visit: Payer: Self-pay

## 2021-06-14 ENCOUNTER — Ambulatory Visit
Admission: EM | Admit: 2021-06-14 | Discharge: 2021-06-14 | Disposition: A | Discharge status: Home / Self Care | Payer: 59 | Attending: Student | Admitting: Student

## 2021-06-14 ENCOUNTER — Encounter: Payer: Self-pay | Admitting: Neurology

## 2021-06-14 ENCOUNTER — Encounter: Payer: Self-pay | Admitting: Emergency Medicine

## 2021-06-14 DIAGNOSIS — J0110 Acute frontal sinusitis, unspecified: Secondary | ICD-10-CM

## 2021-06-14 MED ORDER — AZITHROMYCIN 250 MG PO TABS: 250.0000 mg | ORAL_TABLET | Freq: Every day | ORAL | 0 refills | Status: DC

## 2021-06-14 NOTE — Discharge Instructions (Addendum)
-  Azithromycin (z-pack) x5 days -Call your neurologist to discuss migraines -Head to ED if worst headache of life, new dizziness, new vision changes, new weakness, etc

## 2021-06-14 NOTE — ED Provider Notes (Signed)
[name]    CSN: [id number] Arrival date & time: 06/14/21  1156      History   Chief Complaint Chief Complaint  Patient presents with   Cough   Generalized Body Aches    HPI Theresa Phillips is a 41 y.o. female presenting with cough and congestion for about 1 month, which she attributes to her allergies.  History chronic migraine since 2021 (2 years).  She is followed closely by neurology for headaches, last phone note from them was 06/01/2021.  She describes nasal congestion with progressively worsening frontal headaches, postnasal drip and hoarse voice.  She states that she is taking daily allergy medication, this is not helping. Denies worst headache of life, thunderclap headache, weakness/sensation changes in arms/legs, vision changes, shortness of breath, chest pain/pressure, photophobia, phonophobia, n/v/d. She is adamant that she has not been sick in 2 years.   HPI  Past Medical History:  Diagnosis Date   Migraines    Sinusitis    Vitamin D deficiency     Patient Active Problem List   Diagnosis Date Noted   Chronic nausea 05/13/2020   Acanthosis nigricans 03/05/2020   Constipation 03/05/2020   Irritable bowel syndrome 03/05/2020   Sciatica 03/05/2020   Chronic migraine without aura, intractable, with status migrainosus 12/10/2019   Chronic daily headache 11/03/2019   Chronic pain syndrome 10/30/2019   Lumbar degenerative disc disease 10/30/2019   Lumbar facet joint pain 10/30/2019   Lumbar radiculopathy 10/30/2019   Migraine 03/20/2019   Upper airway cough syndrome/ PNDS ? 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      Home Medications    Prior to Admission medications   Medication Sig Start Date End Date Taking? Authorizing Provider  azithromycin (ZITHROMAX Z-PAK) 250 MG tablet Take 1 tablet (250 mg total) by mouth daily. Two pills (500mg ) day 1. One pill per day (250mg ) days 2-5. 06/14/21  Yes Hazel Sams, PA-C  amitriptyline (ELAVIL) 75 MG tablet Take by mouth. 10/23/19   [provider]  betamethasone acetate-betamethasone sodium phosphate (CELESTONE) 6 (3-3) MG/ML injection Inject into the muscle. 01/26/20   [provider]  Botulinum Toxin Type A (BOTOX) 200 units SOLR  11/18/19   [provider]  carbamazepine (TEGRETOL XR) 200 MG 12 hr tablet Take by mouth. 05/24/20   [provider]  clotrimazole-betamethasone (LOTRISONE) cream Apply topically 2 (two) times daily. 08/19/19   [provider]  cyanocobalamin 1000 MCG tablet Take by  mouth. Patient states that she takes 200 mg.    [provider]  diclofenac (VOLTAREN) 75 MG EC tablet Take by mouth. 10/30/19   [provider]  dicyclomine (BENTYL) 10 MG capsule Take by mouth. 10/15/19   [provider]  Docusate Sodium (DSS) 100 MG CAPS Take by mouth. 07/30/19   [provider]  DULoxetine (CYMBALTA) 30 MG capsule Take by mouth. 05/20/20    [provider]  eltrombopag (PROMACTA) 12.5 MG tablet Take 12.5 mg by mouth as needed. Take on an empty stomach, 1 hour before a meal or 2 hours after.    [provider]  EMGALITY 120 MG/ML SOSY Inject 1 mL into the skin every 30 (thirty) days. 08/27/19   [provider]  emollient (BIAFINE) cream Apply topically. 12/09/15   [provider]  ergocalciferol (VITAMIN D2) 1.25 MG (50000 UT) capsule Take 1 capsule by mouth once a week. 05/09/18   [provider]  fexofenadine (ALLEGRA) 180 MG tablet Take by mouth. 07/30/19   [provider]  fluconazole (DIFLUCAN) 150 MG tablet Take 150 mg by mouth once. 08/19/19   [provider]  fluticasone (FLONASE) 50 MCG/ACT nasal spray Place 2 sprays into both nostrils daily.    [provider]  HYDROcodone-homatropine (HYCODAN) 5-1.5 MG/5ML syrup Take 5 mLs by mouth every 6 (six) hours as needed for cough. 03/06/18   Vanessa Kick, MD  levocetirizine (XYZAL) 5 MG tablet Take 5 mg by mouth daily.    [provider]  linaclotide Rolan Lipa) 145 MCG CAPS capsule Take by mouth. 11/12/19   [provider]  loratadine (CLARITIN) 10 MG tablet Take by mouth.    [provider]  meloxicam (MOBIC) 7.5 MG tablet  06/02/20   [provider]  methocarbamol (ROBAXIN) 750 MG tablet Take by mouth.    [provider]  naproxen (NAPROSYN) 500 MG tablet Take by mouth.    [provider]  neomycin-polymyxin-hydrocortisone (CORTISPORIN) OTIC solution Apply 2 drops to the ingrown toenail site twice daily. Cover with band-aid. 01/22/20   Evelina Bucy, DPM  norgestrel-ethinyl estradiol (LO/OVRAL) 0.3-30 MG-MCG tablet Take 1 tablet by mouth daily.    [provider]  olopatadine (PATANOL) 0.1 % ophthalmic solution Place one drop into both eyes 2 (two) times daily. 03/12/19   [provider]  omeprazole (PRILOSEC) 40 MG capsule Take 40 mg by mouth  daily.    [provider]  ondansetron (ZOFRAN) 4 MG tablet TAKE 1 TABLET BY MOUTH DAILY AS NEEDED FOR NAUSEA 10/03/19   [provider]  ondansetron (ZOFRAN-ODT) 4 MG disintegrating tablet Take 1 tablet (4 mg total) by mouth every 8 (eight) hours as needed for nausea or vomiting. 03/06/18   Vanessa Kick, MD  pantoprazole (PROTONIX) 40 MG tablet Take by mouth. 11/12/19   [provider]  predniSONE (DELTASONE) 10 MG tablet Take by mouth. 07/29/19   [provider]  prochlorperazine (COMPAZINE) 10 MG tablet TAKE 1 TABLET BY MOUTH EVERY DAY AS NEEDED FOR HEADACHE OR NAUSEA 09/12/19   [provider]  promethazine (PHENERGAN) 12.5 MG tablet Take 12.5 mg by mouth every 6 (six) hours as needed. 07/29/19   [provider]  propranolol (INDERAL) 40 MG tablet Take by mouth. 01/07/20   [provider]  RIMEGEPANT SULFATE PO Take 1 tablet by mouth as needed.    [provider]  sulfamethoxazole-trimethoprim (BACTRIM DS) 800-160 MG tablet Take 1 tablet by mouth 2 (two) times  daily. 12/08/19   [provider]  SUMAtriptan (IMITREX) 100 MG tablet Take 100 mg by mouth as directed. 01/12/20   [provider]  SUMAtriptan (IMITREX) 20 MG/ACT nasal spray 1 spray by Alternating Nares route once as needed for migraine. May repeat dose in 2 hours if needed. No more than 2 sprays per day.    [provider]  SUMAtriptan Succinate Refill 6 MG/0.5ML SOCT Inject into the skin.    [provider]  tiZANidine (ZANAFLEX) 2 MG tablet Take 2 mg by mouth every 8 (eight) hours. 08/25/19   [provider]  topiramate (TOPAMAX) 50 MG tablet Take 50 mg by mouth 2 (two) times daily. Patient is on incline to increase to 100mg . Started 02/18/2019    [provider]  traMADol (ULTRAM) 50 MG tablet Take by mouth. 05/08/19   [provider]  triamcinolone acetonide (KENALOG-40) 40 MG/ML injection (RADIOLOGY ONLY)  Inject into the articular space. 11/11/19   [provider]    Family History History reviewed. No pertinent family history.  Social History Social History   Tobacco Use   Smoking status: Never   Smokeless tobacco: Never  Vaping Use   Vaping Use: Never used  Substance Use Topics   Alcohol use: Not Currently   Drug use: Never     Allergies   Ca phosphate-cholecalciferol, Cheese, Guaifenesin, Naproxen, Omeprazole, Other, Peanut (diagnostic), Pork-derived products, Topiramate, Clindamycin/lincomycin, Oxycodone, Tylenol [acetaminophen], Penicillins, and Tramadol   Review of Systems Review of Systems  Constitutional:  Negative for appetite change, chills and fever.  HENT:  Positive for congestion and sinus pressure. Negative for ear pain, rhinorrhea, sinus pain and sore throat.   Eyes:  Negative for redness and visual disturbance.  Respiratory:  Negative for cough, chest tightness, shortness of breath and wheezing.   Cardiovascular:  Negative for chest pain and palpitations.  Gastrointestinal:  Negative for abdominal pain, constipation, diarrhea, nausea and vomiting.  Genitourinary:  Negative for dysuria, frequency and urgency.  Musculoskeletal:  Negative for myalgias.  Neurological:  Negative for dizziness, weakness and headaches.  Psychiatric/Behavioral:  Negative for confusion.   All other systems reviewed and are negative.   Physical Exam Triage Vital Signs ED Triage Vitals  Enc Vitals Group     BP 06/14/21 1328 139/85     Pulse Rate 06/14/21 1328 (!) 115     Resp 06/14/21 1328 18     Temp 06/14/21 1328 98.5 F (36.9 C)     Temp Source 06/14/21 1328 Oral     SpO2 06/14/21 1328 95 %     Weight --      Height --      Head Circumference --      Peak Flow --      Pain Score 06/14/21 1329 6     Pain Loc --      Pain Edu? --      Excl. in GC? --    No data found.  Updated Vital Signs BP 139/85 (BP Location: Left Arm)   Pulse (!) 115   Temp 98.5 F  (36.9 C) (Oral)   Resp 18   SpO2 95%   Visual Acuity Right Eye Distance:   Left Eye Distance:   Bilateral Distance:    Right Eye Near:   Left Eye Near:    Bilateral Near:     Physical Exam Vitals reviewed.  Constitutional:      Appearance: Normal appearance. She is not ill-appearing.  HENT:  Head: Normocephalic and atraumatic.     Right Ear: Hearing, tympanic membrane, ear canal and external ear normal. No swelling or tenderness. No middle ear effusion. There is no impacted cerumen. No mastoid tenderness. Tympanic membrane is not injected, scarred, perforated, erythematous, retracted or bulging.     Left Ear: Hearing, tympanic membrane, ear canal and external ear normal. No swelling or tenderness.  No middle ear effusion. There is no impacted cerumen. No mastoid tenderness. Tympanic membrane is not injected, scarred, perforated, erythematous, retracted or bulging.     Nose:     Right Sinus: Frontal sinus tenderness present. No maxillary sinus tenderness.     Left Sinus: Frontal sinus tenderness present. No maxillary sinus tenderness.     Mouth/Throat:     Pharynx: Oropharynx is clear. No oropharyngeal exudate or posterior oropharyngeal erythema.  Cardiovascular:     Rate and Rhythm: Normal rate and regular rhythm.     Heart sounds: Normal heart sounds.  Pulmonary:     Effort: Pulmonary effort is normal.     Breath sounds: Normal breath sounds.  Lymphadenopathy:     Cervical: No cervical adenopathy.  Neurological:     General: No focal deficit present.     Mental Status: She is alert and oriented to person, place, and time.     Comments: PERRLA, EOMI, CN 2-12 grossly intact. Strength and sensation grossly intact.   Psychiatric:        Mood and Affect: Mood normal.        Behavior: Behavior normal.        Thought Content: Thought content normal.        Judgment: Judgment normal.     UC Treatments / Results  Labs (all labs ordered are listed, but only abnormal results  are displayed) Labs Reviewed - No data to display  EKG   Radiology No results found.  Procedures Procedures (including critical care time)  Medications Ordered in UC Medications - No data to display  Initial Impression / Assessment and Plan / UC Course  I have reviewed the triage vital signs and the nursing notes.  Pertinent labs & imaging results that were available during my care of the patient were reviewed by me and considered in my medical decision making (see chart for details).     This patient is a very pleasant 41 y.o. year old female presenting with sinusitis following allergies x1 month. Afebrile, nontachy. Penicillin allergic. Azithromycin sent. F/u with neurology for chronic headaches. There is no worst headache of life, thunderclap headache, dizziness, vision changes. ED return precautions discussed. Patient verbalizes understanding and agreement.  .   Final Clinical Impressions(s) / UC Diagnoses   Final diagnoses:  Acute non-recurrent frontal sinusitis     Discharge Instructions      -Azithromycin (z-pack) x5 days -Call your neurologist to discuss migraines -Head to ED if worst headache of life, new dizziness, new vision changes, new weakness, etc   ED Prescriptions     Medication Sig Dispense Auth. Provider   azithromycin (ZITHROMAX Z-PAK) 250 MG tablet Take 1 tablet (250 mg total) by mouth daily. Two pills (500mg ) day 1. One pill per day (250mg ) days 2-5. 6 tablet Hazel Sams, PA-C      PDMP not reviewed this encounter.   Hazel Sams, PA-C 06/14/21 1408

## 2021-06-14 NOTE — ED Triage Notes (Signed)
Pt here for cough and congestion with hoarse voice x 2 weeks; pt sts right sided pain and increased pain in head where she has chronic HA x years

## 2021-06-21 ENCOUNTER — Ambulatory Visit (INDEPENDENT_AMBULATORY_CARE_PROVIDER_SITE_OTHER): Payer: 59 | Admitting: Family Medicine

## 2021-06-21 ENCOUNTER — Encounter: Payer: Self-pay | Admitting: Family Medicine

## 2021-06-21 VITALS — BP 135/90 | HR 105 | Temp 98.1°F | Resp 16 | Ht 65.0 in | Wt 294.6 lb

## 2021-06-21 DIAGNOSIS — N760 Acute vaginitis: Secondary | ICD-10-CM | POA: Diagnosis not present

## 2021-06-21 DIAGNOSIS — Z7689 Persons encountering health services in other specified circumstances: Secondary | ICD-10-CM | POA: Diagnosis not present

## 2021-06-21 MED ORDER — METRONIDAZOLE 500 MG PO TABS: 2000.0000 mg | ORAL_TABLET | Freq: Once | ORAL | 0 refills | Status: AC

## 2021-06-21 MED ORDER — FLUCONAZOLE 150 MG PO TABS: 150.0000 mg | ORAL_TABLET | Freq: Once | ORAL | 0 refills | Status: AC

## 2021-06-21 NOTE — Progress Notes (Unsigned)
Patient is her to establish care. Patient c/o vagina itching x 3 days  Patient denies any discharge

## 2021-06-22 NOTE — Progress Notes (Signed)
New Patient Office Visit  Subjective    Patient ID: Theresa Phillips, female    DOB: Jan 06, 1981  Age: 41 y.o. MRN: [id number]  CC: No chief complaint on file.   HPI Theresa Phillips presents to establish care and with complaint of vaginal itching for about 5 days. She denies any known exposures or vaginal discharge. She denies having sexual intercourse for a couple of years.    Outpatient Encounter Medications as of 06/21/2021  Medication Sig   [EXPIRED] fluconazole (DIFLUCAN) 150 MG tablet Take 1 tablet (150 mg total) by mouth once for 1 dose.   [EXPIRED] metroNIDAZOLE (FLAGYL) 500 MG tablet Take 4 tablets (2,000 mg total) by mouth once for 1 dose.   betamethasone acetate-betamethasone sodium phosphate (CELESTONE) 6 (3-3) MG/ML injection Inject into the muscle.   Botulinum Toxin Type A (BOTOX) 200 units SOLR    clotrimazole-betamethasone (LOTRISONE) cream Apply topically 2 (two) times daily.   DULoxetine (CYMBALTA) 30 MG capsule Take by mouth.   emollient (BIAFINE) cream Apply topically.   ergocalciferol (VITAMIN D2) 1.25 MG (50000 UT) capsule Take 1 capsule by mouth once a week.   fluticasone (FLONASE) 50 MCG/ACT nasal spray Place 2 sprays into both nostrils daily.   levocetirizine (XYZAL) 5 MG tablet Take 5 mg by mouth daily.   loratadine (CLARITIN) 10 MG tablet Take by mouth.   methocarbamol (ROBAXIN) 750 MG tablet Take by mouth.   neomycin-polymyxin-hydrocortisone (CORTISPORIN) OTIC solution Apply 2 drops to the ingrown toenail site twice daily. Cover with band-aid.   norgestrel-ethinyl estradiol (LO/OVRAL) 0.3-30 MG-MCG tablet Take 1 tablet by mouth daily.   olopatadine (PATANOL) 0.1 % ophthalmic solution Place one drop into both eyes 2 (two) times daily.   omeprazole (PRILOSEC) 40 MG capsule Take 40 mg by mouth daily.   ondansetron (ZOFRAN) 4 MG tablet TAKE 1 TABLET BY MOUTH DAILY AS NEEDED FOR NAUSEA   ondansetron (ZOFRAN-ODT) 4 MG disintegrating tablet Take 1 tablet (4 mg total) by  mouth every 8 (eight) hours as needed for nausea or vomiting.   pantoprazole (PROTONIX) 40 MG tablet Take by mouth.   predniSONE (DELTASONE) 10 MG tablet Take by mouth.   prochlorperazine (COMPAZINE) 10 MG tablet TAKE 1 TABLET BY MOUTH EVERY DAY AS NEEDED FOR HEADACHE OR NAUSEA   RIMEGEPANT SULFATE PO Take 1 tablet by mouth as needed.   SUMAtriptan (IMITREX) 100 MG tablet Take 100 mg by mouth as directed.   SUMAtriptan Succinate Refill 6 MG/0.5ML SOCT Inject into the skin.   tiZANidine (ZANAFLEX) 2 MG tablet Take 2 mg by mouth every 8 (eight) hours.   [DISCONTINUED] amitriptyline (ELAVIL) 75 MG tablet Take by mouth.   [DISCONTINUED] azithromycin (ZITHROMAX Z-PAK) 250 MG tablet Take 1 tablet (250 mg total) by mouth daily. Two pills (500mg ) day 1. One pill per day (250mg ) days 2-5.   [DISCONTINUED] carbamazepine (TEGRETOL XR) 200 MG 12 hr tablet Take by mouth.   [DISCONTINUED] cyanocobalamin 1000 MCG tablet Take by mouth. Patient states that she takes 200 mg.   [DISCONTINUED] diclofenac (VOLTAREN) 75 MG EC tablet Take by mouth.   [DISCONTINUED] dicyclomine (BENTYL) 10 MG capsule Take by mouth.   [DISCONTINUED] Docusate Sodium (DSS) 100 MG CAPS Take by mouth.   [DISCONTINUED] eltrombopag (PROMACTA) 12.5 MG tablet Take 12.5 mg by mouth as needed. Take on an empty stomach, 1 hour before a meal or 2 hours after.   [DISCONTINUED] EMGALITY 120 MG/ML SOSY Inject 1 mL into the skin every 30 (thirty) days.   [DISCONTINUED]  fexofenadine (ALLEGRA) 180 MG tablet Take by mouth.   [DISCONTINUED] fluconazole (DIFLUCAN) 150 MG tablet Take 150 mg by mouth once.   [DISCONTINUED] HYDROcodone-homatropine (HYCODAN) 5-1.5 MG/5ML syrup Take 5 mLs by mouth every 6 (six) hours as needed for cough.   [DISCONTINUED] linaclotide (LINZESS) 145 MCG CAPS capsule Take by mouth.   [DISCONTINUED] meloxicam (MOBIC) 7.5 MG tablet    [DISCONTINUED] naproxen (NAPROSYN) 500 MG tablet Take by mouth.   [DISCONTINUED] promethazine  (PHENERGAN) 12.5 MG tablet Take 12.5 mg by mouth every 6 (six) hours as needed.   [DISCONTINUED] propranolol (INDERAL) 40 MG tablet Take by mouth.   [DISCONTINUED] sulfamethoxazole-trimethoprim (BACTRIM DS) 800-160 MG tablet Take 1 tablet by mouth 2 (two) times daily.   [DISCONTINUED] SUMAtriptan (IMITREX) 20 MG/ACT nasal spray 1 spray by Alternating Nares route once as needed for migraine. May repeat dose in 2 hours if needed. No more than 2 sprays per day.   [DISCONTINUED] topiramate (TOPAMAX) 50 MG tablet Take 50 mg by mouth 2 (two) times daily. Patient is on incline to increase to 100mg . Started 02/18/2019   [DISCONTINUED] traMADol (ULTRAM) 50 MG tablet Take by mouth.   [DISCONTINUED] triamcinolone acetonide (KENALOG-40) 40 MG/ML injection (RADIOLOGY ONLY) Inject into the articular space.   No facility-administered encounter medications on file as of 06/21/2021.    Past Medical History:  Diagnosis Date   Allergy    See list   Anxiety 05/2014   Clotting disorder (HCC)    Jan/Feb 23   GERD (gastroesophageal reflux disease)    Migraines    Neuromuscular disorder (HCC)    Seizures (HCC)    Sinusitis    Sleep apnea    7 years ago   Vitamin D deficiency     Past Surgical History:  Procedure Laterality Date   EXPLORATORY LAPAROTOMY     WISDOM TOOTH EXTRACTION      Family History  Problem Relation Age of Onset   Early death Mother    Diabetes Maternal Aunt     Social History   Socioeconomic History   Marital status: Single    Spouse name: Not on file   Number of children: Not on file   Years of education: Not on file   Highest education level: Not on file  Occupational History   Not on file  Tobacco Use   Smoking status: Never   Smokeless tobacco: Never  Vaping Use   Vaping Use: Never used  Substance and Sexual Activity   Alcohol use: Yes    Comment: I drink socially every blue moon.   Drug use: Never   Sexual activity: Not Currently    Birth control/protection:  Abstinence, Condom, Pill  Other Topics Concern   Not on file  Social History Narrative   Not on file   Social Determinants of Health   Financial Resource Strain: Not on file  Food Insecurity: Not on file  Transportation Needs: Not on file  Physical Activity: Not on file  Stress: Not on file  Social Connections: Not on file  Intimate Partner Violence: Not on file    Review of Systems  All other systems reviewed and are negative.      Objective    BP 135/90   Pulse (!) 105   Temp 98.1 F (36.7 C) (Oral)   Resp 16   Ht 5\' 5"  (1.651 m)   Wt 294 lb 9.6 oz (133.6 kg)   SpO2 92%   BMI 49.02 kg/m   Physical Exam Vitals and  nursing note reviewed.  Constitutional:      General: She is not in acute distress. Cardiovascular:     Rate and Rhythm: Normal rate and regular rhythm.  Pulmonary:     Effort: Pulmonary effort is normal.     Breath sounds: Normal breath sounds.  Abdominal:     Palpations: Abdomen is soft.     Tenderness: There is no abdominal tenderness.  Neurological:     General: No focal deficit present.     Mental Status: She is alert and oriented to person, place, and time.        Assessment & Plan:   1. Vaginitis and vulvovaginitis Flagyl and diflucan prescribed  2. Encounter to establish care     No follow-ups on file.   Tommie RaymondWilson, Velmer Broadfoot P, MD

## 2021-06-28 ENCOUNTER — Encounter: Payer: Self-pay | Admitting: Family Medicine

## 2021-06-28 ENCOUNTER — Other Ambulatory Visit: Payer: Self-pay | Admitting: Family Medicine

## 2021-06-28 NOTE — Telephone Encounter (Signed)
Medication Refill - Medication:  levocetirizine (XYZAL) 5 MG tablet       Sig - Route: Take 5 mg by mouth     Pt wants refill on this, pt states that she never receive from [facility], states she takes for her allergies and her last PCP always was filling for her.  Has the patient contacted their pharmacy? Yes.   (Agent: If no, request that the patient contact the pharmacy for the refill. If patient does not wish to contact the pharmacy document the reason why and proceed with request.) call dr not filled (Agent: If yes, when and what did the pharmacy advise?)above  Preferred Pharmacy (with phone number or street name):  [id number] - [place], [address]  [place] [address]  Phone: [phone] Fax: [phone]  Hours: Not open 24 hours   Has the patient been seen for an appointment in the last year OR does the patient have an upcoming appointment? Yes.    Agent: Please be advised that RX refills may take up to 3 business days. We ask that you follow-up with your pharmacy.

## 2021-06-28 NOTE — Telephone Encounter (Signed)
Requested medication (s) are due for refill today:   Not sure  Requested medication (s) are on the active medication list:   Yes as a historical medication  Future visit scheduled:   Yes in 1 mo.     Seen a wk ago   Last ordered: Unknown.   Last prescribed by a historical provider 2 yrs ago   Requested Prescriptions  Pending Prescriptions Disp Refills   levocetirizine (XYZAL) 5 MG tablet      Sig: Take 1 tablet (5 mg total) by mouth daily.     Ear, Nose, and Throat:  Antihistamines - levocetirizine dihydrochloride Failed - 06/28/2021 11:12 AM      Failed - Cr in normal range and within 360 days    Creatinine, Ser  Date Value Ref Range Status  07/26/2008 0.75 0.4 - 1.2 mg/dL Final         Failed - eGFR is 10 or above and within 360 days    GFR calc Af Amer  Date Value Ref Range Status  07/26/2008  >60 mL/min Final   >60        The eGFR has been calculated using the MDRD equation. This calculation has not been validated in all clinical situations. eGFR's persistently <60 mL/min signify possible Chronic Kidney Disease.   GFR calc non Af Amer  Date Value Ref Range Status  07/26/2008 >60 >60 mL/min Final         Passed - Valid encounter within last 12 months    Recent Outpatient Visits           1 week ago Vaginitis and vulvovaginitis   [facility], MD       Future Appointments             In 1 month Dorna Mai, MD Primary [facility] at [facility]   In 4 months Pieter Partridge, [facility]

## 2021-06-29 ENCOUNTER — Other Ambulatory Visit: Payer: Self-pay

## 2021-06-29 DIAGNOSIS — J309 Allergic rhinitis, unspecified: Secondary | ICD-10-CM

## 2021-06-29 MED ORDER — LEVOCETIRIZINE DIHYDROCHLORIDE 5 MG PO TABS: 5.0000 mg | ORAL_TABLET | Freq: Every day | ORAL | 0 refills | Status: DC

## 2021-07-01 ENCOUNTER — Ambulatory Visit (INDEPENDENT_AMBULATORY_CARE_PROVIDER_SITE_OTHER): Payer: 59 | Admitting: Obstetrics and Gynecology

## 2021-07-01 ENCOUNTER — Other Ambulatory Visit (HOSPITAL_COMMUNITY)
Admission: RE | Admit: 2021-07-01 | Discharge: 2021-07-01 | Disposition: A | Discharge status: Home / Self Care | Payer: 59 | Source: Ambulatory Visit | Attending: Obstetrics and Gynecology | Admitting: Obstetrics and Gynecology

## 2021-07-01 ENCOUNTER — Encounter: Payer: Self-pay | Admitting: Obstetrics and Gynecology

## 2021-07-01 VITALS — BP 131/63 | HR 98 | Ht 65.0 in | Wt 298.0 lb

## 2021-07-01 DIAGNOSIS — E282 Polycystic ovarian syndrome: Secondary | ICD-10-CM | POA: Diagnosis not present

## 2021-07-01 DIAGNOSIS — R102 Pelvic and perineal pain unspecified side: Secondary | ICD-10-CM

## 2021-07-01 DIAGNOSIS — Z6841 Body Mass Index (BMI) 40.0 and over, adult: Secondary | ICD-10-CM

## 2021-07-01 DIAGNOSIS — Z01419 Encounter for gynecological examination (general) (routine) without abnormal findings: Secondary | ICD-10-CM | POA: Diagnosis present

## 2021-07-01 MED ORDER — FLUCONAZOLE 150 MG PO TABS: 150.0000 mg | ORAL_TABLET | Freq: Once | ORAL | 0 refills | Status: AC

## 2021-07-01 MED ORDER — NORGESTIMATE-ETH ESTRADIOL 0.25-35 MG-MCG PO TABS: 1.0000 | ORAL_TABLET | Freq: Every day | ORAL | 11 refills | Status: DC

## 2021-07-01 MED ORDER — NYSTATIN-TRIAMCINOLONE 100000-0.1 UNIT/GM-% EX OINT: 1.0000 "application " | TOPICAL_OINTMENT | Freq: Two times a day (BID) | CUTANEOUS | 0 refills | Status: DC

## 2021-07-01 NOTE — Progress Notes (Signed)
Subjective:     Theresa Phillips is a 41 y.o. female P0 with LMP 05/16/21 and BMI 49 who is here for a comprehensive physical exam. The patient reports irregular menses, often skipping months which started in her mid-30s. Patient was diagnosed with PCOS and has been taking COC. She reports a continued irregular bleeding pattern with COC, often skipping months still. She reports onset of RLQ pain a few days ago which was worst last night. She reports some vulva irritation as well. Patient is not currently sexually active but is in a relationship and is interested in conceiving. Patient denies abnormal discharge. She is without any other complaints.   Past Medical History:  Diagnosis Date   Allergy    See list   Anxiety 05/2014   Clotting disorder (HCC)    Jan/Feb 23   GERD (gastroesophageal reflux disease)    Migraines    Neuromuscular disorder (HCC)    Seizures (HCC)    Sinusitis    Sleep apnea    7 years ago   Vitamin D deficiency    Past Surgical History:  Procedure Laterality Date   EXPLORATORY LAPAROTOMY     WISDOM TOOTH EXTRACTION     Family History  Problem Relation Age of Onset   Early death Mother    Diabetes Maternal Aunt     Social History   Socioeconomic History   Marital status: Single    Spouse name: Not on file   Number of children: Not on file   Years of education: Not on file   Highest education level: Not on file  Occupational History   Not on file  Tobacco Use   Smoking status: Never   Smokeless tobacco: Never  Vaping Use   Vaping Use: Never used  Substance and Sexual Activity   Alcohol use: Yes    Comment: I drink socially every blue moon.   Drug use: Never   Sexual activity: Not Currently    Birth control/protection: Abstinence, Condom, Pill  Other Topics Concern   Not on file  Social History Narrative   Not on file   Social Determinants of Health   Financial Resource Strain: Not on file  Food Insecurity: Not on file  Transportation Needs:  Not on file  Physical Activity: Not on file  Stress: Not on file  Social Connections: Not on file  Intimate Partner Violence: Not on file   Health Maintenance  Topic Date Due   HIV Screening  Never done   Hepatitis C Screening  Never done   PAP SMEAR-Modifier  Never done   COVID-19 Vaccine (4 - Booster) 09/08/2020   INFLUENZA VACCINE  08/23/2021   TETANUS/TDAP  12/02/2023   HPV VACCINES  Aged Out       Review of Systems Pertinent items noted in HPI and remainder of comprehensive ROS otherwise negative.   Objective:  Blood pressure 131/63, pulse 98, height 5\' 5"  (1.651 m), weight 298 lb (135.2 kg), last menstrual period 05/16/2021.   GENERAL: Well-developed, well-nourished female in no acute distress.  HEENT: Normocephalic, atraumatic. Sclerae anicteric.  NECK: Supple. Normal thyroid.  LUNGS: Clear to auscultation bilaterally.  HEART: Regular rate and rhythm. BREASTS: Symmetric in size. No palpable masses or lymphadenopathy, skin changes, or nipple drainage. ABDOMEN: Soft, nontender, nondistended. No organomegaly. PELVIC: Normal external female genitalia. Vagina is pink and rugated.  Normal discharge. Normal appearing cervix. Uterus is normal in size. No adnexal mass or tenderness. Chaperone present during the pelvic exam EXTREMITIES: No cyanosis, clubbing,  or edema, 2+ distal pulses.     Assessment:    Healthy female exam.      Plan:    Pap smear collected Screening mammogram ordered- last one normal 12/22/20 Patient will be contacted with abnormal results Discussed diagnosis of PCOS and impact on fertility. Patient is being referred to infertility for further management and assistance given PCOS and her age Patient referred to nutritionist to assist with weight loss and preconception optimization Pelvic ultrasound ordered to evaluate pelvic pain Rx Sprintec provided to assist with regulating menses until patient is ready to conceive Patient advised to start taking  prenatal vitamins a month before discontinuation of contraception Rx mycolog cream provided for vulva skin irritation   See After Visit Summary for Counseling Recommendations

## 2021-07-01 NOTE — Progress Notes (Signed)
Patient complaining of irregular bleeding. (Bleeding for 30 days in a row - two incidents of this) Vaginal itching Right sided pain. Armandina Stammer RN

## 2021-07-04 ENCOUNTER — Encounter: Payer: Self-pay | Admitting: Obstetrics and Gynecology

## 2021-07-05 LAB — CYTOLOGY - PAP
Adequacy: ABSENT
Comment: NEGATIVE
Diagnosis: NEGATIVE
High risk HPV: NEGATIVE

## 2021-07-06 ENCOUNTER — Ambulatory Visit (HOSPITAL_BASED_OUTPATIENT_CLINIC_OR_DEPARTMENT_OTHER): Payer: 59

## 2021-07-08 ENCOUNTER — Ambulatory Visit (HOSPITAL_BASED_OUTPATIENT_CLINIC_OR_DEPARTMENT_OTHER)
Admission: RE | Admit: 2021-07-08 | Discharge: 2021-07-08 | Disposition: A | Discharge status: Home / Self Care | Payer: 59 | Source: Ambulatory Visit | Attending: Obstetrics and Gynecology | Admitting: Obstetrics and Gynecology

## 2021-07-08 DIAGNOSIS — R102 Pelvic and perineal pain: Secondary | ICD-10-CM | POA: Diagnosis not present

## 2021-07-13 ENCOUNTER — Telehealth: Payer: Self-pay

## 2021-07-13 NOTE — Telephone Encounter (Signed)
-----   Message from Tereso Newcomer, MD sent at 07/12/2021  7:28 PM EDT ----- Patient has a possible cervical polyp.  May need hysteroscopy and removal of polyp to help with her continued bleeding (may be able to be done in office there under anesthesia or OR if she desires).   Will need to discus results with a surgeon and then be scheduled for the cervical polypectomy in the office or OR.  Please call to inform patient of results and recommendations and schedule follow up appointment.

## 2021-07-13 NOTE — Telephone Encounter (Signed)
Called patient to inform her that her US shows that she may have a cervical polyp. Patient made aware that she may need a hysteroscopy and it can be done in the office or the OR. Patient is scheduled for a f/u appt on 08/26/21. Understanding was voiced. Dustina Scoggin l Mosetta Ferdinand, CMA

## 2021-07-14 ENCOUNTER — Encounter: Payer: Self-pay | Admitting: Obstetrics and Gynecology

## 2021-07-19 ENCOUNTER — Encounter: Payer: Self-pay | Admitting: Obstetrics & Gynecology

## 2021-07-28 ENCOUNTER — Encounter: Payer: Self-pay | Admitting: Obstetrics and Gynecology

## 2021-07-28 ENCOUNTER — Encounter: Payer: Self-pay | Admitting: Family Medicine

## 2021-07-28 ENCOUNTER — Ambulatory Visit (INDEPENDENT_AMBULATORY_CARE_PROVIDER_SITE_OTHER): Payer: 59 | Admitting: Family Medicine

## 2021-07-28 VITALS — BP 118/87 | HR 99 | Temp 98.0°F | Resp 16 | Wt 295.1 lb

## 2021-07-28 DIAGNOSIS — Z114 Encounter for screening for human immunodeficiency virus [HIV]: Secondary | ICD-10-CM

## 2021-07-28 DIAGNOSIS — Z0000 Encounter for general adult medical examination without abnormal findings: Secondary | ICD-10-CM

## 2021-07-28 DIAGNOSIS — Z13220 Encounter for screening for lipoid disorders: Secondary | ICD-10-CM

## 2021-07-28 DIAGNOSIS — Z6841 Body Mass Index (BMI) 40.0 and over, adult: Secondary | ICD-10-CM

## 2021-07-28 DIAGNOSIS — Z1159 Encounter for screening for other viral diseases: Secondary | ICD-10-CM

## 2021-07-28 DIAGNOSIS — Z130 Encounter for screening for diseases of the blood and blood-forming organs and certain disorders involving the immune mechanism: Secondary | ICD-10-CM

## 2021-07-28 NOTE — Progress Notes (Signed)
Established Patient Office Visit  Subjective    Patient ID: Theresa Phillips, female    DOB: May 09, 1980  Age: 41 y.o. MRN: [id number]  CC: No chief complaint on file.   HPI Theresa Phillips presents for routine annual exam.    Outpatient Encounter Medications as of 07/28/2021  Medication Sig   amitriptyline (ELAVIL) 50 MG tablet Take 50 mg by mouth daily.   betamethasone acetate-betamethasone sodium phosphate (CELESTONE) 6 (3-3) MG/ML injection Inject into the muscle.   Botulinum Toxin Type A (BOTOX) 200 units SOLR    clotrimazole-betamethasone (LOTRISONE) cream Apply topically 2 (two) times daily.   dicyclomine (BENTYL) 20 MG tablet Take 20 mg by mouth 2 (two) times daily as needed.   DULoxetine (CYMBALTA) 60 MG capsule Take 60 mg by mouth daily.   emollient (BIAFINE) cream Apply topically.   ergocalciferol (VITAMIN D2) 1.25 MG (50000 UT) capsule Take 1 capsule by mouth once a week.   fluticasone (FLONASE) 50 MCG/ACT nasal spray Place 2 sprays into both nostrils daily.   gabapentin (NEURONTIN) 600 MG tablet Take 600 mg by mouth at bedtime.   levocetirizine (XYZAL) 5 MG tablet Take 1 tablet (5 mg total) by mouth daily.   loratadine (CLARITIN) 10 MG tablet Take by mouth.   metFORMIN (GLUCOPHAGE-XR) 500 MG 24 hr tablet Take 500 mg by mouth daily.   neomycin-polymyxin-hydrocortisone (CORTISPORIN) OTIC solution Apply 2 drops to the ingrown toenail site twice daily. Cover with band-aid.   norgestimate-ethinyl estradiol (ORTHO-CYCLEN) 0.25-35 MG-MCG tablet Take 1 tablet by mouth daily.   norgestrel-ethinyl estradiol (LO/OVRAL) 0.3-30 MG-MCG tablet Take 1 tablet by mouth daily.   nystatin-triamcinolone ointment (MYCOLOG) Apply 1 application  topically 2 (two) times daily.   olopatadine (PATANOL) 0.1 % ophthalmic solution Place one drop into both eyes 2 (two) times daily.   ondansetron (ZOFRAN-ODT) 4 MG disintegrating tablet Take 1 tablet (4 mg total) by mouth every 8 (eight) hours as needed for  nausea or vomiting.   pantoprazole (PROTONIX) 40 MG tablet Take by mouth.   predniSONE (DELTASONE) 10 MG tablet Take by mouth.   prochlorperazine (COMPAZINE) 10 MG tablet TAKE 1 TABLET BY MOUTH EVERY DAY AS NEEDED FOR HEADACHE OR NAUSEA   RIMEGEPANT SULFATE PO Take 1 tablet by mouth as needed.   SUMAtriptan (IMITREX) 100 MG tablet Take 100 mg by mouth as directed.   [DISCONTINUED] DULoxetine (CYMBALTA) 30 MG capsule Take by mouth.   [DISCONTINUED] methocarbamol (ROBAXIN) 750 MG tablet Take by mouth. (Patient not taking: Reported on 07/01/2021)   [DISCONTINUED] omeprazole (PRILOSEC) 40 MG capsule Take 40 mg by mouth daily. (Patient not taking: Reported on 07/01/2021)   [DISCONTINUED] ondansetron (ZOFRAN) 4 MG tablet TAKE 1 TABLET BY MOUTH DAILY AS NEEDED FOR NAUSEA   [DISCONTINUED] SUMAtriptan Succinate Refill 6 MG/0.5ML SOCT Inject into the skin.   [DISCONTINUED] tiZANidine (ZANAFLEX) 2 MG tablet Take 2 mg by mouth every 8 (eight) hours.   No facility-administered encounter medications on file as of 07/28/2021.    Past Medical History:  Diagnosis Date   Allergy    See list   Anxiety 05/2014   Clotting disorder (HCC)    Jan/Feb 23   GERD (gastroesophageal reflux disease)    Migraines    Neuromuscular disorder (HCC)    Seizures (HCC)    Sinusitis    Sleep apnea    7 years ago   Vitamin D deficiency     Past Surgical History:  Procedure Laterality Date   EXPLORATORY LAPAROTOMY  WISDOM TOOTH EXTRACTION      Family History  Problem Relation Age of Onset   Early death Mother    Diabetes Maternal Aunt     Social History   Socioeconomic History   Marital status: Single    Spouse name: Not on file   Number of children: Not on file   Years of education: Not on file   Highest education level: Not on file  Occupational History   Not on file  Tobacco Use   Smoking status: Never   Smokeless tobacco: Never  Vaping Use   Vaping Use: Never used  Substance and Sexual Activity    Alcohol use: Yes    Comment: I drink socially every blue moon.   Drug use: Never   Sexual activity: Not Currently    Birth control/protection: Abstinence, Condom, Pill  Other Topics Concern   Not on file  Social History Narrative   Not on file   Social Determinants of Health   Financial Resource Strain: Not on file  Food Insecurity: Not on file  Transportation Needs: Not on file  Physical Activity: Not on file  Stress: Not on file  Social Connections: Not on file  Intimate Partner Violence: Not on file    Review of Systems  All other systems reviewed and are negative.       Objective    BP 118/87   Pulse 99   Temp 98 F (36.7 C) (Oral)   Resp 16   Wt 295 lb 1.6 oz (133.9 kg)   LMP 05/16/2021 (Approximate)   SpO2 94%   BMI 49.11 kg/m   Physical Exam Vitals and nursing note reviewed.  Constitutional:      General: She is not in acute distress.    Appearance: She is obese.  HENT:     Head: Normocephalic and atraumatic.     Right Ear: Tympanic membrane, ear canal and external ear normal.     Left Ear: Tympanic membrane, ear canal and external ear normal.     Nose: Nose normal.     Mouth/Throat:     Mouth: Mucous membranes are moist.     Pharynx: Oropharynx is clear.  Eyes:     Conjunctiva/sclera: Conjunctivae normal.     Pupils: Pupils are equal, round, and reactive to light.  Neck:     Thyroid: No thyromegaly.  Cardiovascular:     Rate and Rhythm: Normal rate and regular rhythm.     Heart sounds: Normal heart sounds. No murmur heard. Pulmonary:     Effort: Pulmonary effort is normal. No respiratory distress.     Breath sounds: Normal breath sounds.  Abdominal:     General: There is no distension.     Palpations: Abdomen is soft. There is no mass.     Tenderness: There is no abdominal tenderness.  Musculoskeletal:        General: Normal range of motion.     Cervical back: Normal range of motion and neck supple.  Skin:    General: Skin is warm and  dry.  Neurological:     General: No focal deficit present.     Mental Status: She is alert and oriented to person, place, and time.  Psychiatric:        Mood and Affect: Mood normal.        Behavior: Behavior normal.         Assessment & Plan:   1. Annual physical exam  - CMP14+EGFR  2. Screening for  deficiency anemia  - CBC with Differential  3. Screening for lipid disorders  - Lipid Panel  4. Screening for endocrine/metabolic/immunity disorders  - Vitamin B12 - Vitamin D, 25-hydroxy - TSH - Hemoglobin A1c  5. Screening for HIV (human immunodeficiency virus)   6. Need for hepatitis C screening test  - Hepatitis C Antibody    No follow-ups on file.   Becky Sax, MD

## 2021-07-29 LAB — LIPID PANEL
Chol/HDL Ratio: 3.1 ratio (ref 0.0–4.4)
Cholesterol, Total: 180 mg/dL (ref 100–199)
HDL: 59 mg/dL (ref >39)
LDL Chol Calc (NIH): 102 mg/dL — ABNORMAL HIGH (ref 0–99)
Triglycerides: 104 mg/dL (ref 0–149)
VLDL Cholesterol Cal: 19 mg/dL (ref 5–40)

## 2021-07-29 LAB — CBC WITH DIFFERENTIAL/PLATELET
Basophils Absolute: 0 10*3/uL (ref 0.0–0.2)
Basos: 1 %
EOS (ABSOLUTE): 0.2 10*3/uL (ref 0.0–0.4)
Eos: 3 %
Hematocrit: 37 % (ref 34.0–46.6)
Hemoglobin: 12.3 g/dL (ref 11.1–15.9)
Immature Grans (Abs): 0 10*3/uL (ref 0.0–0.1)
Immature Granulocytes: 0 %
Lymphocytes Absolute: 1.9 10*3/uL (ref 0.7–3.1)
Lymphs: 30 %
MCH: 26.9 pg (ref 26.6–33.0)
MCHC: 33.2 g/dL (ref 31.5–35.7)
MCV: 81 fL (ref 79–97)
Monocytes Absolute: 0.5 10*3/uL (ref 0.1–0.9)
Monocytes: 7 %
Neutrophils Absolute: 3.7 10*3/uL (ref 1.4–7.0)
Neutrophils: 59 %
Platelets: 286 10*3/uL (ref 150–450)
RBC: 4.57 x10E6/uL (ref 3.77–5.28)
RDW: 14.5 % (ref 11.7–15.4)
WBC: 6.3 10*3/uL (ref 3.4–10.8)

## 2021-07-29 LAB — CMP14+EGFR
ALT: 19 IU/L (ref 0–32)
AST: 18 IU/L (ref 0–40)
Albumin/Globulin Ratio: 1.4 (ref 1.2–2.2)
Albumin: 4 g/dL (ref 3.8–4.8)
Alkaline Phosphatase: 107 IU/L (ref 44–121)
BUN/Creatinine Ratio: 13 (ref 9–23)
BUN: 10 mg/dL (ref 6–24)
Bilirubin Total: <0.2 mg/dL (ref 0.0–1.2)
CO2: 21 mmol/L (ref 20–29)
Calcium: 9.1 mg/dL (ref 8.7–10.2)
Chloride: 103 mmol/L (ref 96–106)
Creatinine, Ser: 0.75 mg/dL (ref 0.57–1.00)
Globulin, Total: 2.9 g/dL (ref 1.5–4.5)
Glucose: 77 mg/dL (ref 70–99)
Potassium: 3.9 mmol/L (ref 3.5–5.2)
Sodium: 139 mmol/L (ref 134–144)
Total Protein: 6.9 g/dL (ref 6.0–8.5)
eGFR: 102 mL/min/{1.73_m2} (ref >59)

## 2021-07-29 LAB — HEMOGLOBIN A1C
Est. average glucose Bld gHb Est-mCnc: 123 mg/dL
Hgb A1c MFr Bld: 5.9 % — ABNORMAL HIGH (ref 4.8–5.6)

## 2021-07-29 LAB — TSH: TSH: 4.9 u[IU]/mL — ABNORMAL HIGH (ref 0.450–4.500)

## 2021-07-29 LAB — VITAMIN D 25 HYDROXY (VIT D DEFICIENCY, FRACTURES): Vit D, 25-Hydroxy: 54.7 ng/mL (ref 30.0–100.0)

## 2021-07-29 LAB — HEPATITIS C ANTIBODY: Hep C Virus Ab: NONREACTIVE

## 2021-07-29 LAB — VITAMIN B12: Vitamin B-12: 368 pg/mL (ref 232–1245)

## 2021-08-01 ENCOUNTER — Other Ambulatory Visit (HOSPITAL_COMMUNITY)
Admission: RE | Admit: 2021-08-01 | Discharge: 2021-08-01 | Disposition: A | Discharge status: Home / Self Care | Payer: 59 | Source: Ambulatory Visit | Attending: Family Medicine | Admitting: Family Medicine

## 2021-08-01 ENCOUNTER — Encounter: Payer: Self-pay | Admitting: Family Medicine

## 2021-08-01 ENCOUNTER — Ambulatory Visit (INDEPENDENT_AMBULATORY_CARE_PROVIDER_SITE_OTHER): Payer: 59

## 2021-08-01 VITALS — BP 124/81 | HR 102 | Wt 295.0 lb

## 2021-08-01 DIAGNOSIS — N898 Other specified noninflammatory disorders of vagina: Secondary | ICD-10-CM

## 2021-08-01 DIAGNOSIS — R102 Pelvic and perineal pain: Secondary | ICD-10-CM | POA: Diagnosis not present

## 2021-08-01 NOTE — Telephone Encounter (Signed)
Patient concern

## 2021-08-01 NOTE — Progress Notes (Signed)
Patient presents with vaginal pain, itching, and odor x 2 weeks.  Patient states she is not sexually active. Self swab was sent to the lab.  Nyellie Yetter l Heather Mckendree, CMA

## 2021-08-02 LAB — CERVICOVAGINAL ANCILLARY ONLY
Bacterial Vaginitis (gardnerella): NEGATIVE
Candida Glabrata: NEGATIVE
Candida Vaginitis: NEGATIVE
Comment: NEGATIVE
Comment: NEGATIVE
Comment: NEGATIVE

## 2021-08-05 ENCOUNTER — Telehealth: Payer: Self-pay | Admitting: Family Medicine

## 2021-08-05 NOTE — Telephone Encounter (Signed)
Copied from CRM [id number]. Topic: General - Other >> Aug 05, 2021  4:32 PM Turkey B wrote: Reason for CRM: Patient called in asking if Dr Andrey Campanile is gonna to a treatment plan since her tsh showed low with labs. Please call back

## 2021-08-05 NOTE — Telephone Encounter (Signed)
Patient is asking about labs from 07/28/2021

## 2021-08-05 NOTE — Telephone Encounter (Signed)
Medication Refill - Medication: vitamin B Not on med list, patientt is requesting this because of her lab results Has the patient contacted their pharmacy? No, not on med list (Agent: If no, request that the patient contact the pharmacy for the refill. If patient does not wish to contact the pharmacy document the reason why and proceed with request.) (Agent: If yes, when and what did the pharmacy advise?)patient is asking for actual prescription for this not on med list  Preferred Pharmacy (with phone number or street name): [id number] [place] [place] - [address] Phone:  [phone]  Fax:  [phone]     Has the patient been seen for an appointment in the last year OR does the patient have an upcoming appointment? yes  Agent: Please be advised that RX refills may take up to 3 business days. We ask that you follow-up with your pharmacy.

## 2021-08-08 ENCOUNTER — Encounter: Payer: Self-pay | Admitting: Obstetrics and Gynecology

## 2021-08-09 ENCOUNTER — Other Ambulatory Visit: Payer: Self-pay | Admitting: Obstetrics and Gynecology

## 2021-08-09 MED ORDER — CLOBETASOL PROPIONATE 0.05 % EX OINT: TOPICAL_OINTMENT | CUTANEOUS | 5 refills | Status: DC

## 2021-08-09 NOTE — Telephone Encounter (Signed)
Please advise. Patient said that she requested that PCP write a rx for VIT B at her appt.

## 2021-08-10 ENCOUNTER — Encounter: Payer: Self-pay | Admitting: *Deleted

## 2021-08-10 IMAGING — RF DG UGI W/ SMALL BOWEL
13 of 28 series · 13 of 39 positions shown · non-contrast
Comparison: CT abdomen pelvis 03/19/2019

CLINICAL DATA: Dominant pain.  Chronic nausea and vomiting.

EXAM:
UPPER GI SERIES WITH SMALL BOWEL FOLLOW-THROUGH
FLUOROSCOPY TIME:  Fluoroscopy Time:  3 minutes 24 seconds
Radiation Exposure Index (if provided by the fluoroscopic device):
Number of Acquired Spot Images: 0
TECHNIQUE: Combined double contrast and single contrast upper GI series using
effervescent crystals, thick barium, and thin barium. Subsequently,
serial images of the small bowel were obtained including spot views
of the terminal ileum.

[Series 2: t abdomen barium ap · 0.15mm/px · 1 of 1 slices shown]
[im 1/1]
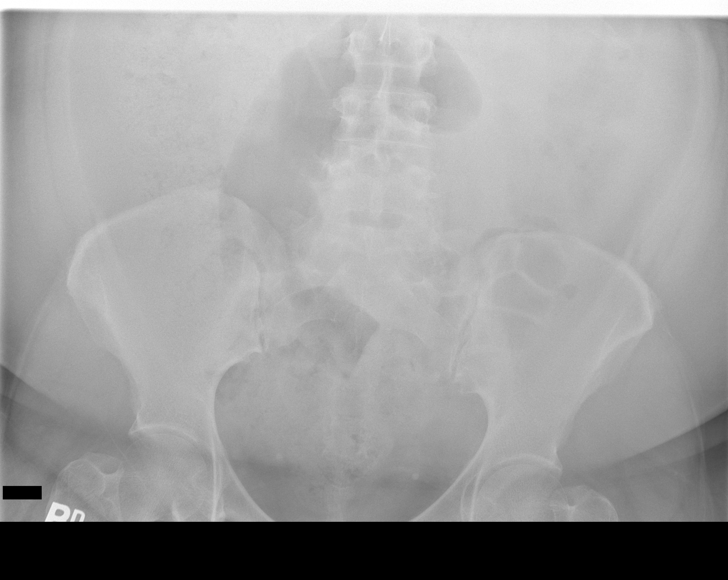

[Series 3: cp_bariatric · 0.34mm/px · 1 of 46 frames shown (1 of 3)]
[frame 24/46]
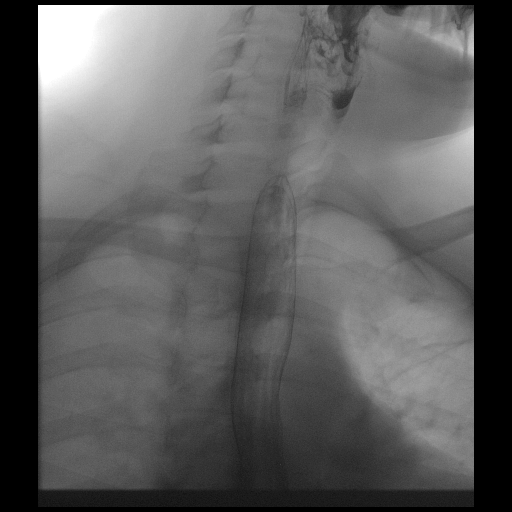

[Series 4: cp_bariatric · 0.34mm/px · 1 of 19 frames shown (2 of 3)]
[frame 10/19]
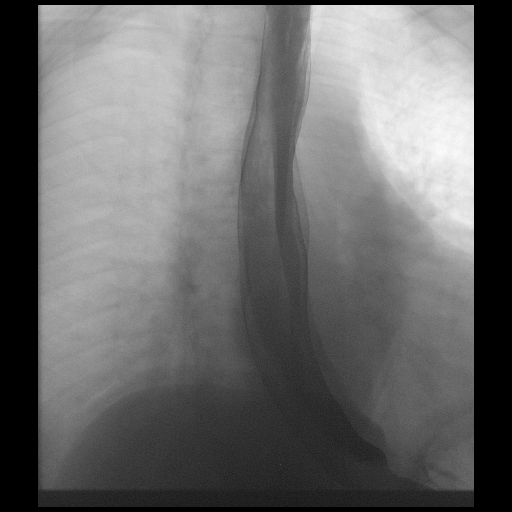

[Series 5: fluoro_barium_bariatric 2fps_bw · 0.19mm/px · 1 of 1 slices shown (1 of 3)]
[im 1/1]
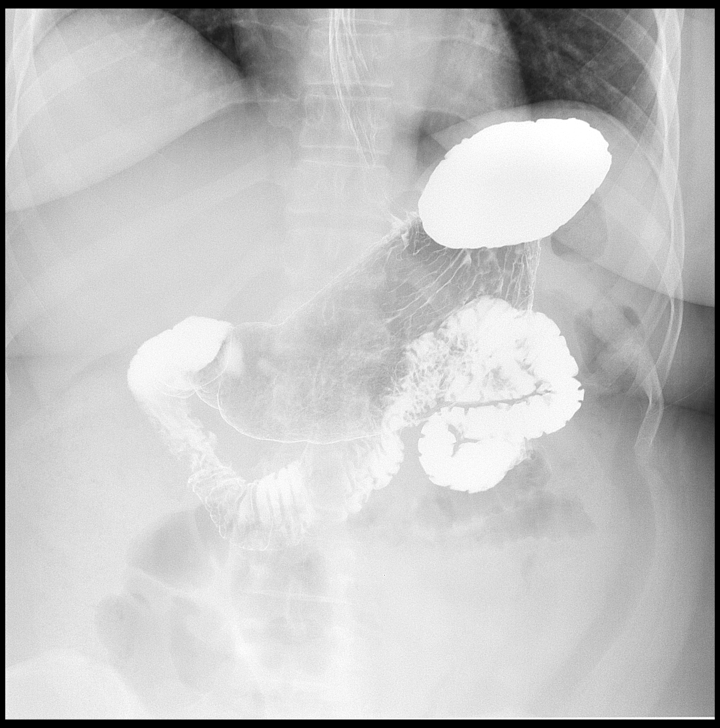

[Series 8: fluoro_barium_bariatric 2fps_bw · 0.18mm/px · 1 of 1 slices shown (2 of 3)]
[im 1/1]
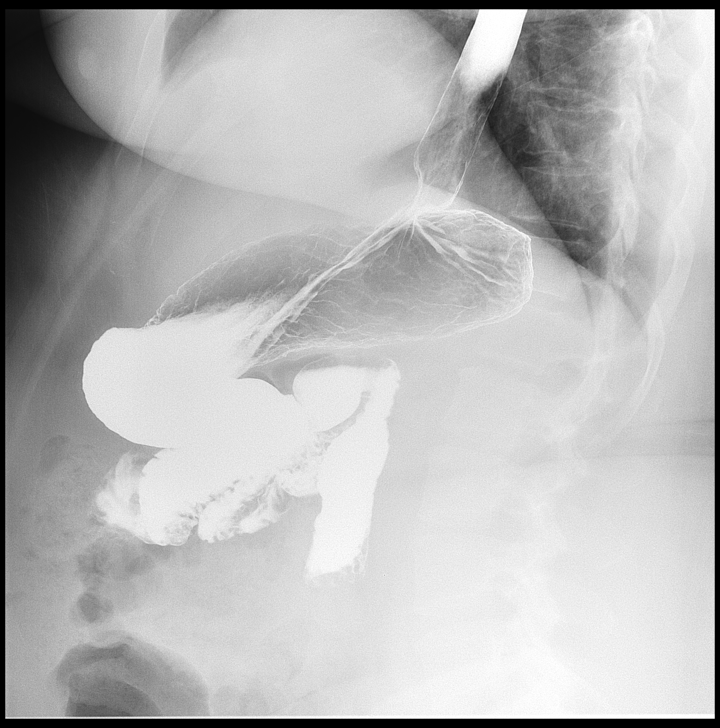

[Series 9: cp_bariatric · 0.53mm/px · 1 of 38 frames shown (3 of 3)]
[frame 32/38]
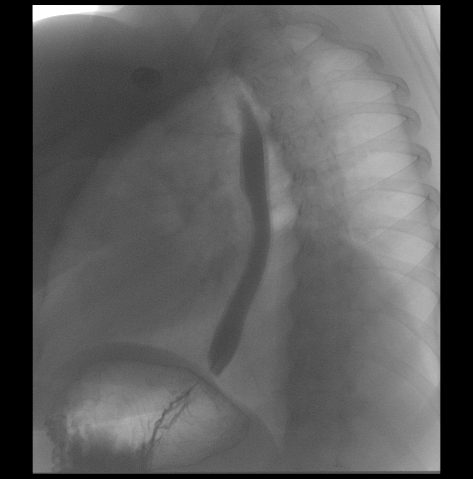

[Series 11: fluoro_barium_bariatric 2fps_bw · 0.17mm/px · 1 of 1 slices shown (3 of 3)]
[im 1/1]
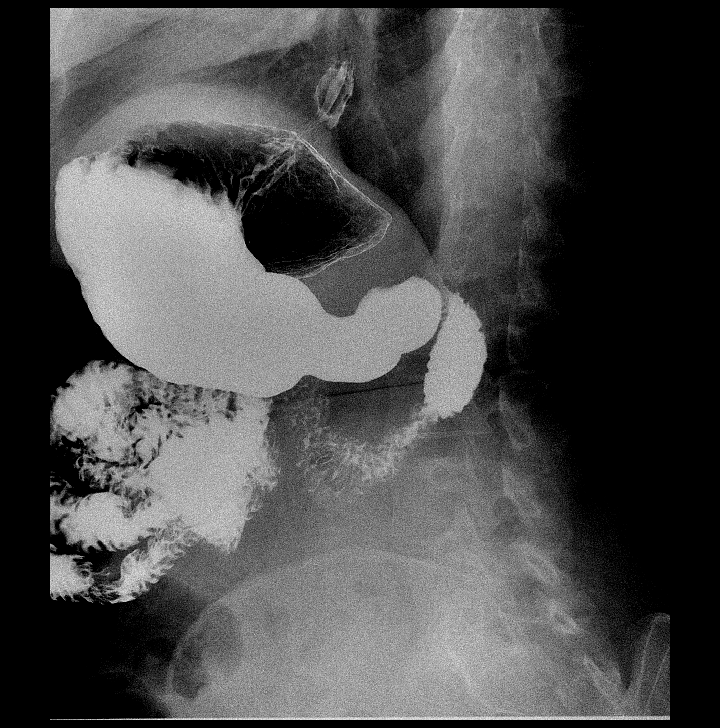

[Series 14: t abdomen barium pa · 0.15mm/px · 1 of 1 slices shown (1 of 4)]
[im 1/1]
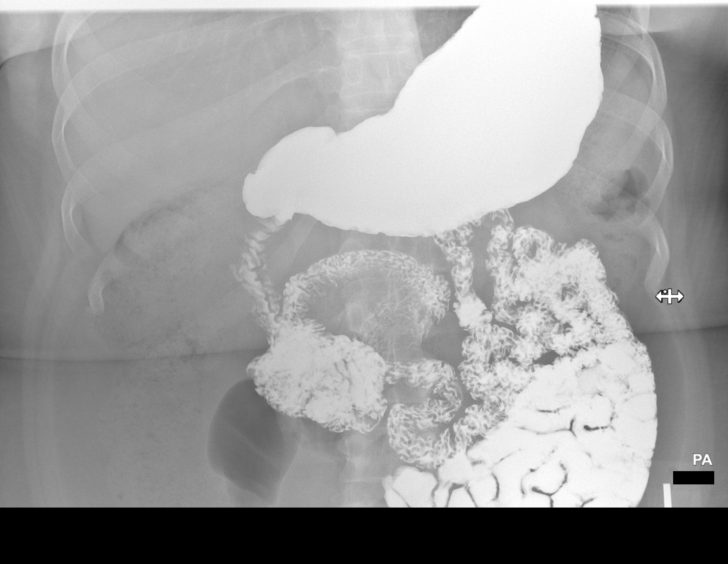

[Series 17: t abdomen barium pa · 0.15mm/px · 1 of 1 slices shown (2 of 4)]
[im 1/1]
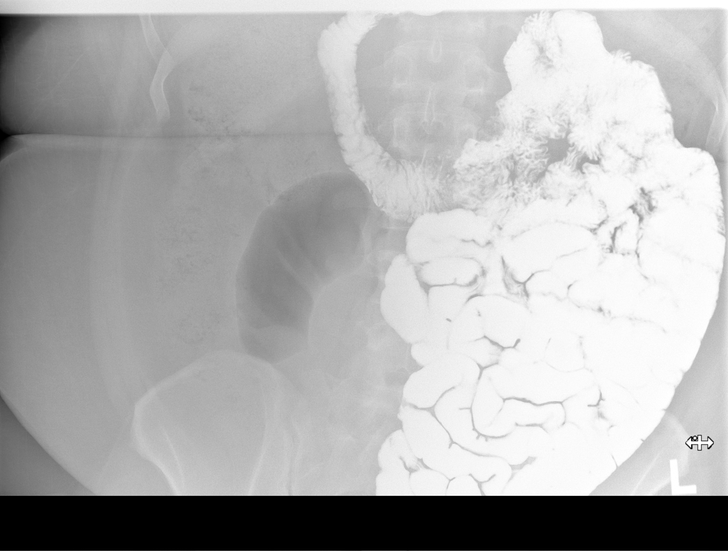

[Series 20: t abdomen barium pa · 0.15mm/px · 1 of 1 slices shown (3 of 4)]
[im 1/1]
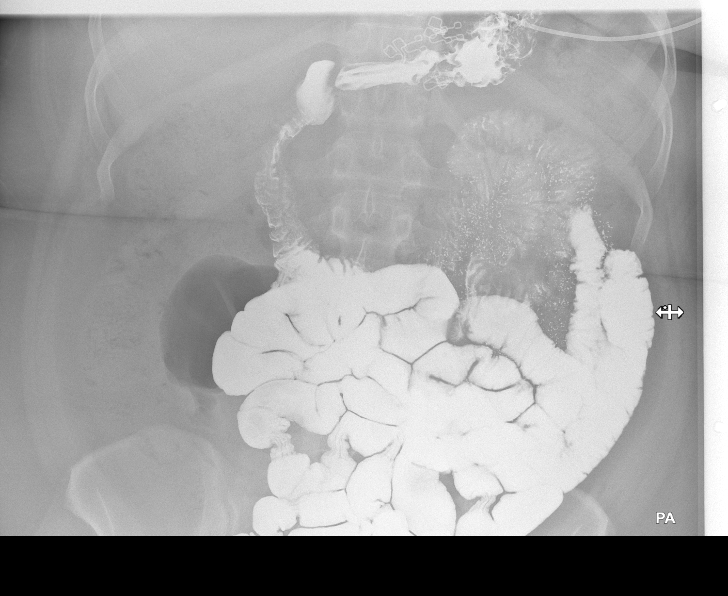

[Series 23: t abdomen barium pa · 0.15mm/px · 1 of 1 slices shown (4 of 4)]
[im 1/1]
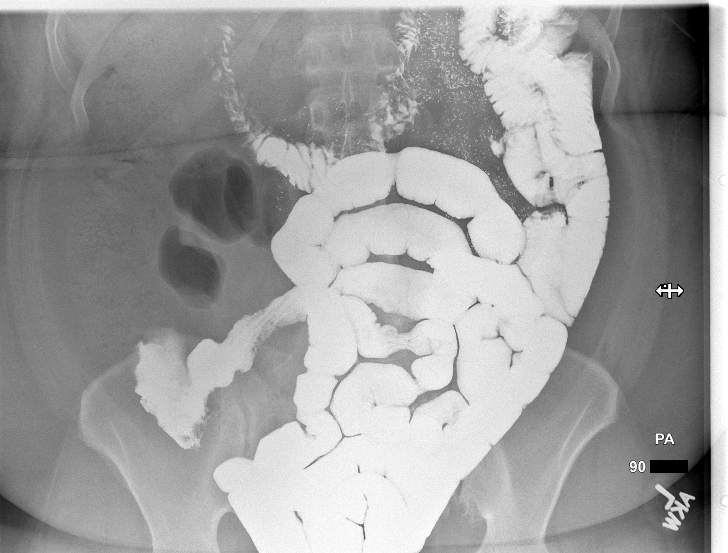

[Series 26: fluoro_barium 4fps_bb · 0.19mm/px · 1 of 3 frames shown (1 of 2)]
[frame 1/3]
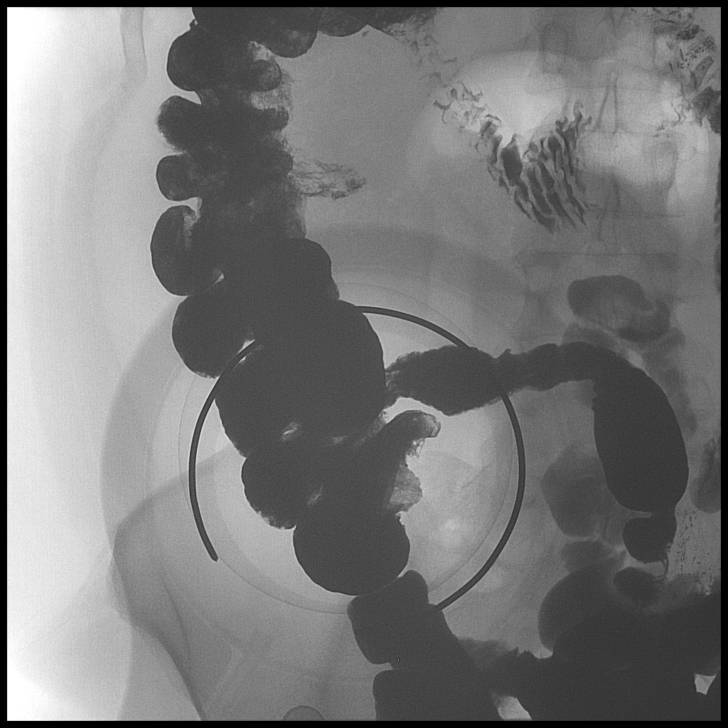

[Series 27: fluoro_barium 4fps_bb · 0.19mm/px · 1 of 1 slices shown (2 of 2)]
[im 1/1]
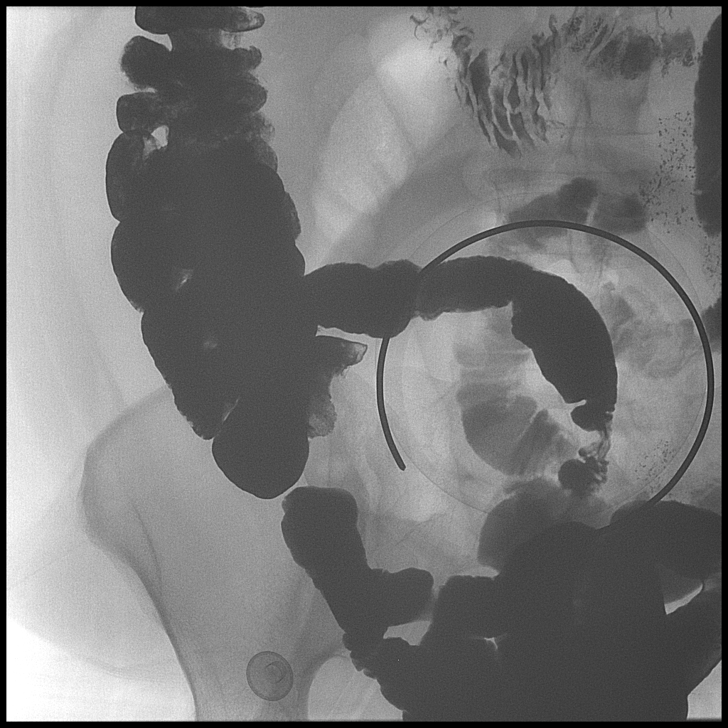

[13 of 39 positions shown; findings below may reference images not displayed]

FINDINGS: Preliminary KUB negative

Esophageal mucosa and motility normal. No stricture mass or mucosal
irregularity. Negative for hiatal hernia.

Stomach and duodenum appear normal. No gastric ulcer or mass.
Duodenal bulb normal.

Small bowel transit time approximately 120 minutes which is mildly
delayed. No small bowel dilatation or edema. No obstruction or mass.
Terminal ileum appears normal. No evidence of Crohn's disease or
mass.
IMPRESSION: Negative upper GI.

Mild delay in small bowel transit time. No bowel obstruction or
mucosal edema identified.

## 2021-08-10 NOTE — Telephone Encounter (Unsigned)
Copied from CRM [id number]. Topic: General - Inquiry >> Aug 10, 2021  3:22 PM De Blanch wrote: Reason for CRM: Pt stated she was advised by PCP and nurse to call in and schedule labs.  No orders .  Please advise pt would like to come in the morning around 11 a.m.  Pt requesting a call back.

## 2021-08-10 NOTE — Telephone Encounter (Signed)
Patient concerns

## 2021-08-12 DIAGNOSIS — R1010 Upper abdominal pain, unspecified: Secondary | ICD-10-CM | POA: Diagnosis not present

## 2021-08-15 ENCOUNTER — Other Ambulatory Visit: Payer: 59

## 2021-08-15 ENCOUNTER — Other Ambulatory Visit: Payer: Self-pay | Admitting: Family Medicine

## 2021-08-15 DIAGNOSIS — R7989 Other specified abnormal findings of blood chemistry: Secondary | ICD-10-CM

## 2021-08-15 NOTE — Progress Notes (Signed)
Patient came in for [facility] labs. Rad tech took her labs

## 2021-08-16 ENCOUNTER — Ambulatory Visit: Payer: Self-pay | Admitting: *Deleted

## 2021-08-16 LAB — TSH: TSH: 2.91 u[IU]/mL (ref 0.450–4.500)

## 2021-08-16 NOTE — Telephone Encounter (Signed)
Summary: nose bleed   Pt called in for assistance. Pt says that she has been using Flonase for years. Pt says that she used it today and it caused a nose bleed. Pt says that she had some labs completed and was told that her TSH is normal. Pt says that she is unsure of why she had a nose bleed.     Pt would like to discuss further       Chief Complaint: nose bleed x 1  this am  Symptoms: after using flonase patient noted nose bleed with blood "pouring out of left side of nose" this am last 2-3 minutes before stopping. No clots noted. Reports spitting out blood yesterday while clearing throat Frequency: today  Pertinent Negatives: Patient denies lightheadedness. No dizziness. Disposition: [] ED /[] Urgent Care (no appt availability in office) / [] Appointment(In office/virtual)/ []  [place] [facility]/ [x] Home Care/ [] Refused Recommended Disposition /[] [facility]/ []  Follow-up with PCP Additional Notes:   Recommended to monitor and call back if another nose bleed occurs. Please advise.         Reason for Disposition  [1] Mild-moderate nosebleed AND [2] bleeding stopped now  Answer Assessment - Initial Assessment Questions 1. AMOUNT OF BLEEDING: "How bad is the bleeding?" "How much blood was lost?" "Has the bleeding stopped?"   - MILD: needed a couple tissues   - MODERATE: needed many tissues   - SEVERE: large blood clots, soaked many tissues, lasted more than 30 minutes      Stopped within 2-3 minutes but was pouring out of left side of nose no clots 2. ONSET: "When did the nosebleed start?"      After using flonase this am  3. FREQUENCY: "How many nosebleeds have you had in the last 24 hours?"      1 4. RECURRENT SYMPTOMS: "Have there been other recent nosebleeds?" If Yes, ask: "How long did it take you to stop the bleeding?" "What worked best?"      No, noted spitting out blood yesterday possible post nasal drip 5. CAUSE: "What do you think caused this  nosebleed?"     Not sure  6. LOCAL FACTORS: "Do you have any cold symptoms?", "Have you been rubbing or picking at your nose?"     Dry cough  7. SYSTEMIC FACTORS: "Do you have high blood pressure or any bleeding problems?"     No  8. BLOOD THINNERS: "Do you take any blood thinners?" (e.g., aspirin, clopidogrel / Plavix, coumadin, heparin). Notes: Other strong blood thinners include: Arixtra (fondaparinux), Eliquis (apixaban), Pradaxa (dabigatran), and Xarelto (rivaroxaban).     na 9. OTHER SYMPTOMS: "Do you have any other symptoms?" (e.g., lightheadedness)     No  10. PREGNANCY: "Is there any chance you are pregnant?" "When was your last menstrual period?"       na  Protocols used: Nosebleed-A-AH

## 2021-08-19 NOTE — Telephone Encounter (Signed)
Patient is concern  about her thyroid levels and the fact that it's normal but she still has symptoms of being cold,fatigue, dry skin  Please advise

## 2021-08-19 NOTE — Telephone Encounter (Signed)
Please advise patient.  

## 2021-08-20 ENCOUNTER — Encounter: Payer: Self-pay | Admitting: Family Medicine

## 2021-08-21 DIAGNOSIS — K200 Eosinophilic esophagitis: Secondary | ICD-10-CM | POA: Diagnosis not present

## 2021-08-23 ENCOUNTER — Encounter: Payer: Self-pay | Admitting: *Deleted

## 2021-08-24 ENCOUNTER — Encounter: Payer: Self-pay | Admitting: *Deleted

## 2021-08-25 ENCOUNTER — Encounter: Payer: Self-pay | Admitting: Psychiatry

## 2021-08-25 NOTE — Telephone Encounter (Signed)
Yes, especially if they are prolonged or occur back to back. She should avoid driving if there is concern for seizure. Per [place] law patients should be seizure free for 6 months before they can drive

## 2021-08-26 ENCOUNTER — Ambulatory Visit: Payer: Self-pay | Admitting: *Deleted

## 2021-08-26 ENCOUNTER — Encounter: Payer: Self-pay | Admitting: Obstetrics & Gynecology

## 2021-08-26 ENCOUNTER — Ambulatory Visit: Payer: 59 | Admitting: Obstetrics & Gynecology

## 2021-08-26 VITALS — BP 100/61 | HR 101 | Wt 298.0 lb

## 2021-08-26 DIAGNOSIS — Z712 Person consulting for explanation of examination or test findings: Secondary | ICD-10-CM

## 2021-08-26 DIAGNOSIS — B372 Candidiasis of skin and nail: Secondary | ICD-10-CM

## 2021-08-26 MED ORDER — FLUCONAZOLE 150 MG PO TABS: 150.0000 mg | ORAL_TABLET | Freq: Once | ORAL | 3 refills | Status: DC

## 2021-08-26 NOTE — Progress Notes (Signed)
Patient ID: Theresa Phillips, female    DOB: 22-Dec-1980  MRN: [id number]  CC: No chief complaint on file.   Subjective: Theresa Phillips is a 41 y.o. female who presents for  Her concerns today include:     f/u with thyroid questions  On 08/06/2021 per Andrey Campanile Would recheck TSH along with T3 and T4 before starting agent.  On 08/20/2021 per Andrey Campanile  Results are normal/clinically unremarkable - no further management indicated at this time Most recent TSH 08/15/2021 normal    f/u with thyroid questions/Friday/[phone]/[facility] triaged pt and she is calling about pain and tingling in hands for 3 months, thinks it is either vitamin deficiency or carpal tunnel. 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   Recurrent boils 12/01/2020   Chronic nausea 05/13/2020   Acanthosis nigricans 03/05/2020   Constipation 03/05/2020   Irritable bowel syndrome 03/05/2020   Sciatica 03/05/2020   Chronic migraine without aura, intractable, with status migrainosus 12/10/2019   Chronic daily headache 11/03/2019   Chronic pain syndrome 10/30/2019   Lumbar degenerative disc disease 10/30/2019   Lumbar facet joint pain 10/30/2019   Lumbar radiculopathy 10/30/2019   Carpal tunnel syndrome of left wrist 04/04/2019   Carpal tunnel syndrome of right wrist 04/04/2019   Migraine 03/20/2019   Upper airway cough syndrome/ PNDS ? allergic rhinitis  03/01/2019   Morbid obesity due to excess calories c/b OSA 03/01/2019   OSA on CPAP 03/01/2019   DOE (dyspnea on exertion) 02/28/2019   Tremor 02/26/2019   Bilateral carpal tunnel syndrome 02/13/2019   Radial styloid tenosynovitis of left hand 02/13/2019   Radial styloid tenosynovitis of right hand 02/13/2019   Obesity, Class III, BMI 40-49.9 (morbid obesity) (HCC) 04/07/2018   Morbid obesity with BMI of 45.0-49.9, adult (HCC) 04/07/2018   Cognitive complaints 05/30/2017   PCOS (polycystic ovarian  syndrome) 12/09/2015   Neck pain 01/12/2015   Numbness and tingling in left hand 01/12/2015   Bilateral lower extremity pain 08/07/2014   Sleep related rhythmic movement disorder 07/13/2014   Acute pain of right shoulder 06/29/2014   Midline low back pain without sciatica 06/29/2014   Multiple joint pain 06/29/2014   Chronic pain of both knees 06/29/2014   Orthostasis 11/27/2013   Gastritis, chronic 08/12/2013   Allergic rhinitis 01/27/2013   Vitamin D deficiency 11/27/2012   Chest pain 09/12/2012     Current Outpatient Medications on File Prior to Visit  Medication Sig Dispense Refill   amitriptyline (ELAVIL) 50 MG tablet Take 50 mg by mouth daily.     betamethasone acetate-betamethasone sodium phosphate (CELESTONE) 6 (3-3) MG/ML injection Inject into the muscle.     Botulinum Toxin Type A (BOTOX) 200 units SOLR      clobetasol ointment (TEMOVATE) 0.05 % Apply to affected area every night for 4 weeks, then every other day for 4 weeks and then twice a week for 4 weeks or until resolution. 30 g 5   clotrimazole-betamethasone (LOTRISONE) cream Apply topically 2 (two) times daily.     dicyclomine (BENTYL) 20 MG tablet Take 20 mg by mouth 2 (two) times daily as needed.     DULoxetine (CYMBALTA) 60 MG capsule Take 60 mg by mouth daily.     emollient (BIAFINE) cream Apply topically.     ergocalciferol (VITAMIN D2) 1.25 MG (50000 UT) capsule Take 1 capsule by mouth once a week.     fluticasone (FLONASE) 50 MCG/ACT nasal  spray Place 2 sprays into both nostrils daily.     gabapentin (NEURONTIN) 600 MG tablet Take 600 mg by mouth at bedtime.     levocetirizine (XYZAL) 5 MG tablet Take 1 tablet (5 mg total) by mouth daily. 90 tablet 0   loratadine (CLARITIN) 10 MG tablet Take by mouth.     metFORMIN (GLUCOPHAGE-XR) 500 MG 24 hr tablet Take 500 mg by mouth daily.     neomycin-polymyxin-hydrocortisone (CORTISPORIN) OTIC solution Apply 2 drops to the ingrown toenail site twice daily. Cover with  band-aid. 10 mL 0   norgestimate-ethinyl estradiol (ORTHO-CYCLEN) 0.25-35 MG-MCG tablet Take 1 tablet by mouth daily. 28 tablet 11   norgestrel-ethinyl estradiol (LO/OVRAL) 0.3-30 MG-MCG tablet Take 1 tablet by mouth daily.     nystatin-triamcinolone ointment (MYCOLOG) Apply 1 application  topically 2 (two) times daily. 30 g 0   olopatadine (PATANOL) 0.1 % ophthalmic solution Place one drop into both eyes 2 (two) times daily.     ondansetron (ZOFRAN-ODT) 4 MG disintegrating tablet Take 1 tablet (4 mg total) by mouth every 8 (eight) hours as needed for nausea or vomiting. 15 tablet 0   pantoprazole (PROTONIX) 40 MG tablet Take by mouth.     predniSONE (DELTASONE) 10 MG tablet Take by mouth.     prochlorperazine (COMPAZINE) 10 MG tablet TAKE 1 TABLET BY MOUTH EVERY DAY AS NEEDED FOR HEADACHE OR NAUSEA     RIMEGEPANT SULFATE PO Take 1 tablet by mouth as needed.     SUMAtriptan (IMITREX) 100 MG tablet Take 100 mg by mouth as directed.     No current facility-administered medications on file prior to visit.    Allergies  Allergen Reactions   Lanolin Itching and Other (See Comments)   Penicillins Rash and Other (See Comments)   Ca Phosphate-Cholecalciferol Other (See Comments)   Cheese Other (See Comments)   Guaifenesin Other (See Comments)   Naproxen Other (See Comments)   Omeprazole Other (See Comments)    headache   Other Other (See Comments)   Peanut (Diagnostic) Other (See Comments)   Pork-Derived Products Other (See Comments)   Topiramate Other (See Comments)    Tingling in hands and feet   Clindamycin/Lincomycin Itching   Oxycodone Itching   Tylenol [Acetaminophen] Other (See Comments)    Gives me "migraines".  States she can take it mixed with hydrocodone.      Social History   Socioeconomic History   Marital status: Single    Spouse name: Not on file   Number of children: Not on file   Years of education: Not on file   Highest education level: Not on file  Occupational  History    Comment: business consultant    Comment: Geologist, engineering  Tobacco Use   Smoking status: Never   Smokeless tobacco: Never  Vaping Use   Vaping Use: Never used  Substance and Sexual Activity   Alcohol use: Yes    Comment: I drink socially every blue moon.   Drug use: Never   Sexual activity: Not Currently    Birth control/protection: Abstinence, Condom, Pill  Other Topics Concern   Not on file  Social History Narrative   Lives with room mate   Social Determinants of Health   Financial Resource Strain: Not on file  Food Insecurity: Not on file  Transportation Needs: Not on file  Physical Activity: Not on file  Stress: Not on file  Social Connections: Not on file  Intimate Partner Violence: Not on file  Family History  Problem Relation Age of Onset   Early death Mother    Bipolar disorder Father    Diabetes Maternal Grandfather    Diabetes Maternal Aunt    Breast cancer Paternal Aunt     Past Surgical History:  Procedure Laterality Date   EXPLORATORY LAPAROTOMY     WISDOM TOOTH EXTRACTION      ROS: Review of Systems Negative except as stated above  PHYSICAL EXAM: There were no vitals taken for this visit.  Physical Exam        Latest Ref Rng & Units 07/28/2021   11:16 AM 07/26/2008   11:45 PM  CMP  Glucose 70 - 99 mg/dL 77  88   BUN 6 - 24 mg/dL 10  9   Creatinine 4.74 - 1.00 mg/dL 2.59  5.63   Sodium 875 - 144 mmol/L 139  140   Potassium 3.5 - 5.2 mmol/L 3.9  3.3   Chloride 96 - 106 mmol/L 103  110   CO2 20 - 29 mmol/L 21  26   Calcium 8.7 - 10.2 mg/dL 9.1  9.3   Total Protein 6.0 - 8.5 g/dL 6.9  7.4   Total Bilirubin 0.0 - 1.2 mg/dL <6.4  0.6   Alkaline Phos 44 - 121 IU/L 107  97   AST 0 - 40 IU/L 18  23   ALT 0 - 32 IU/L 19  20    Lipid Panel     Component Value Date/Time   CHOL 180 07/28/2021 1116   TRIG 104 07/28/2021 1116   HDL 59 07/28/2021 1116   CHOLHDL 3.1 07/28/2021 1116   LDLCALC 102 (H) 07/28/2021 1116    CBC     Component Value Date/Time   WBC 6.3 07/28/2021 1116   WBC 7.2 07/26/2008 2345   RBC 4.57 07/28/2021 1116   RBC 4.44 07/26/2008 2345   HGB 12.3 07/28/2021 1116   HCT 37.0 07/28/2021 1116   PLT 286 07/28/2021 1116   MCV 81 07/28/2021 1116   MCH 26.9 07/28/2021 1116   MCHC 33.2 07/28/2021 1116   MCHC 34.8 07/26/2008 2345   RDW 14.5 07/28/2021 1116   LYMPHSABS 1.9 07/28/2021 1116   MONOABS 0.8 07/26/2008 2345   EOSABS 0.2 07/28/2021 1116   BASOSABS 0.0 07/28/2021 1116    ASSESSMENT AND PLAN:  There are no diagnoses linked to this encounter.   Patient was given the opportunity to ask questions.  Patient verbalized understanding of the plan and was able to repeat key elements of the plan. Patient was given clear instructions to go to Emergency Department or return to medical center if symptoms don't improve, worsen, or new problems develop.The patient verbalized understanding.   No orders of the defined types were placed in this encounter.    Requested Prescriptions    No prescriptions requested or ordered in this encounter    No follow-ups on file.  Rema Fendt, NP

## 2021-08-26 NOTE — Telephone Encounter (Signed)
  Chief Complaint: bil hand pain, tingling Symptoms: occasionally left one swells Frequency: most of time Pertinent Negatives: Patient denies fever Disposition: [] ED /[] Urgent Care (no appt availability in office) / [x] Appointment(In office/virtual)/ []  [place] [facility]/ [] Home Care/ [] Refused Recommended Disposition /[] [facility]/ []  Follow-up with PCP Additional Notes: Pt states her hands have hurt for 3 months, she thinks it is due to vitamin deficiency or carpal tunnel. Sometimes the left one swells. She states Dr told her to take OTC supplements but she has not purchased any. Appt for next week, on wait list. Pt states she tried to schedule via MyChart and received a call from office to schedule on phone.   Reason for Disposition  Hand or wrist pain is a chronic symptom (recurrent or ongoing AND present > 4 weeks)  Answer Assessment - Initial Assessment Questions 1. ONSET: "When did the pain start?"     3 months ago 2. LOCATION: "Where is the pain located?"     Fingers tingle, pain between thumb and 2nd finger but holding a utensil the pain is in the center of palm  3. PAIN: "How bad is the pain?" (Scale 1-10; or mild, moderate, severe)   - MILD (1-3): doesn't interfere with normal activities   - MODERATE (4-7): interferes with normal activities (e.g., work or school) or awakens from sleep   - SEVERE (8-10): excruciating pain, unable to use hand at all     8 when in pain, not painful all the time but a lot of time 4. WORK OR EXERCISE: "Has there been any recent work or exercise that involved this part (i.e., hand or wrist) of the body?"     No, thinks it is related to low vitamin b 5. CAUSE: "What do you think is causing the pain?"     Low vitamin B, Dr  said she can get OTC 6. AGGRAVATING FACTORS: "What makes the pain worse?" (e.g., using computer)     Using it, when awakens left one swells 7. OTHER SYMPTOMS: "Do you have any other symptoms?" (e.g.,  neck pain, swelling, rash, numbness, fever)     No, thinks it may be low vitamin, has not taken OTC 8. PREGNANCY: "Is there any chance you are pregnant?" "When was your last menstrual period?"     no  Protocols used: Hand and Wrist Pain-A-AH

## 2021-08-26 NOTE — Progress Notes (Signed)
GYNECOLOGY OFFICE VISIT NOTE  History:   Theresa Phillips is a 41 y.o. G0P0000 here today for discussion of ultrasound results after evaluation for pelvic pain. She denies any current abnormal vaginal discharge, bleeding, pelvic pain or other concerns. She does report episode of yeast skin infection under breast and she is using Mycolog cream, desires refill of Diflucan too to help with this.    Past Medical History:  Diagnosis Date   Allergy    See list   Anxiety 05/2014   Chronic pain syndrome    Clotting disorder (HCC)    Jan/Feb 23   GERD (gastroesophageal reflux disease)    Migraines    Neuromuscular disorder (HCC)    PCOS (polycystic ovarian syndrome)    PTSD (post-traumatic stress disorder)    Seizures (HCC)    Sinusitis    Sleep apnea    7 years ago, CPAP   Vitamin D deficiency     Past Surgical History:  Procedure Laterality Date   EXPLORATORY LAPAROTOMY     WISDOM TOOTH EXTRACTION      The following portions of the patient's history were reviewed and updated as appropriate: allergies, current medications, past family history, past medical history, past social history, past surgical history and problem list.   Health Maintenance:  Normal pap and negative HRHPV on 07/01/2021.  Normal mammogram on 11/2020 at [facility].   Review of Systems:  Pertinent items noted in HPI and remainder of comprehensive ROS otherwise negative.  Physical Exam:  BP 100/61   Pulse (!) 101   Wt 298 lb (135.2 kg)   BMI 49.59 kg/m  CONSTITUTIONAL: Well-developed, well-nourished female in no acute distress.  SKIN: No rash noted. Not diaphoretic. No erythema. No pallor. MUSCULOSKELETAL: Normal range of motion. No edema noted. NEUROLOGIC: Alert and oriented to person, place, and time. Normal muscle tone coordination. No cranial nerve deficit noted. PSYCHIATRIC: Normal mood and affect. Normal behavior. Normal judgment and thought content. CARDIOVASCULAR: Normal heart rate noted RESPIRATORY:  Effort and breath sounds normal, no problems with respiration noted ABDOMEN: No masses noted. No other overt distention noted.   PELVIC: Deferred  Labs and Imaging US PELVIC COMPLETE WITH TRANSVAGINAL  Result Date: 07/08/2021 CLINICAL DATA:  Pain right lower quadrant EXAM: TRANSABDOMINAL AND TRANSVAGINAL ULTRASOUND OF PELVIS TECHNIQUE: Both transabdominal and transvaginal ultrasound examinations of the pelvis were performed. Transabdominal technique was performed for global imaging of the pelvis including uterus, ovaries, adnexal regions, and pelvic cul-de-sac. It was necessary to proceed with endovaginal exam following the transabdominal exam to visualize the endometrium and ovaries. COMPARISON:  CT abdomen and pelvis done on 03/19/2019 FINDINGS: Uterus Measurements: 5.3 x 2.9 x 3.7 cm = volume: 30.1 mL. No fibroids or other mass visualized. Endometrium Thickness: 2.8 mm. There is linear hyperechoic focus measuring 1.6 x 0.5 cm in size in the central portion of the cervix without significant increased vascularity. There is trace amount of fluid in the cervical canal. Right ovary Measurements: 2.1 x 1.5 x 1.4 cm = volume: 2.3 mL. Normal appearance/no adnexal mass. Left ovary Measurements: 1.4 x 0.9 x 1.2 cm = volume: 1.1 mL. Normal appearance/no adnexal mass. Other findings No abnormal free fluid. IMPRESSION: There is 1.6 x 0.5 cm hyperechoic focus in the central portion of the cervix which may suggest a polyp or pedunculated fibroid or blood products. Small amount of fluid is seen around this hyperechoic focus in the cervical canal. Short-term follow-up pelvic sonogram in 1-2 months may be considered. Pelvic sonogram is otherwise  unremarkable. Electronically Signed   By: Ernie Avena M.D.   On: 07/08/2021 15:31       Assessment and Plan:     1. Skin yeast infection Already had Mycolog cream, also prescribed Diflucan cream as desired.  Exam deferred by patient.  - fluconazole (DIFLUCAN) 150 MG  tablet; Take 1 tablet (150 mg total) by mouth once for 1 dose. Can take additional dose three days later if symptoms persist  Dispense: 1 tablet; Refill: 3  2. Encounter to discuss test results Ultrasound results discussed, not concerned about cervical hyperechoic focus. No etiology for pain seen. Patient's pain is resolved at this point. No further intervention needed.     Routine preventative health maintenance measures emphasized. Please refer to After Visit Summary for other counseling recommendations.   Return for any gynecologic concerns.    I spent 20 minutes dedicated to the care of this patient including pre-visit review of records, face to face time with the patient discussing her conditions and treatments and post visit orders.    Jaynie Collins, MD, FACOG Obstetrician & Gynecologist, [facility] for [employer], [facility]

## 2021-08-30 ENCOUNTER — Ambulatory Visit (INDEPENDENT_AMBULATORY_CARE_PROVIDER_SITE_OTHER): Payer: 59 | Admitting: Psychiatry

## 2021-08-30 ENCOUNTER — Other Ambulatory Visit: Payer: Self-pay | Admitting: Psychiatry

## 2021-08-30 ENCOUNTER — Telehealth: Payer: Self-pay | Admitting: *Deleted

## 2021-08-30 ENCOUNTER — Encounter: Payer: Self-pay | Admitting: *Deleted

## 2021-08-30 VITALS — Ht 65.0 in | Wt 297.4 lb

## 2021-08-30 DIAGNOSIS — M5412 Radiculopathy, cervical region: Secondary | ICD-10-CM | POA: Diagnosis not present

## 2021-08-30 DIAGNOSIS — G43119 Migraine with aura, intractable, without status migrainosus: Secondary | ICD-10-CM | POA: Diagnosis not present

## 2021-08-30 DIAGNOSIS — G4452 New daily persistent headache (NDPH): Secondary | ICD-10-CM | POA: Diagnosis not present

## 2021-08-30 MED ORDER — ZOLMITRIPTAN 5 MG PO TABS: 5.0000 mg | ORAL_TABLET | ORAL | 6 refills | Status: DC | PRN

## 2021-08-30 MED ORDER — ZOLMITRIPTAN 5 MG NA SOLN
1.0000 | NASAL | 0 refills | Status: DC | PRN
Start: 2021-08-30 — End: 2021-08-30

## 2021-08-30 NOTE — Progress Notes (Signed)
Referring:  Noah Delaine, MD [address] [place],  [place] [address]  PCP: Georganna Skeans, MD  Neurology was asked to evaluate Theresa Phillips, a 41 year old female for a chief complaint of headaches.  Our recommendations of care will be communicated by shared medical record.    CC:  headaches  History provided from self  HPI:  Medical co-morbidities: lumbar radiculopathy, prediabetes  The patient presents for evaluation of headaches which began February 10, 2019. No clear trigger for onset of headaches. Headaches have been constant since onset and fluctuate in severity. She has more debilitating migraines 2-3 times per week. Headaches are associated with photophobia, phonophobia, and nausea. She states her baseline headache is 6/10 and more severe migraines are 12-15/10.  She will also have neck pain which radiates down her right side. Has paresthesias and decreased grip strength in both of her hands. Will frequently drop objects and struggle to open cans.  Takes Imitrex and Nurtec as needed for migraines. Takes Botox and elavil 50 mg QHS for migraine prevention, gabapentin 600 mg TID for ear/jaw pain, and Cymbalta 60 mg daily for body pain. These medications take the edge off of her headache but do not resolve it.  Has been having intermittent head and left hand shaking for the past 3 years. This most commonly occurs in her sleep and lasts 20-30 seconds at a time. Has not lost consciousness, bit her tongue, or lost continence. Had 6-8 episodes over the past 10 years. 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  Headache History: Onset: January 2021 Aura: floating black dots Location: unilateral R>L Associated Symptoms:  Photophobia: yes  Phonophobia: yes  Nausea: yes Allodynia: yes Worse with activity?: yes Duration of headaches: constant  Headache days per month: 30 Headache free days per month: 0  Current  Treatment: Abortive Imitrex 100 mg PRN Nurtec 75 mg PRN Zofran Compazine  Preventative Botox every 3 months Elavil 50 mg QHS Gabapentin 600 mg TID   Prior Therapies                                 Imitrex 100 mg PRN Nurtec 75 mg PRN Zofran Compazine Botox Elavil 50 mg QHS - drowsiness at higher doses carbamazepine propranolol Cymbalta 60 mg daily Gabapentin 600 mg TID Emgality 120 mg monthly Vyepti - helped somewhat  LABS: CBC    Component Value Date/Time   WBC 6.3 07/28/2021 1116   WBC 7.2 07/26/2008 2345   RBC 4.57 07/28/2021 1116   RBC 4.44 07/26/2008 2345   HGB 12.3 07/28/2021 1116   HCT 37.0 07/28/2021 1116   PLT 286 07/28/2021 1116   MCV 81 07/28/2021 1116   MCH 26.9 07/28/2021 1116   MCHC 33.2 07/28/2021 1116   MCHC 34.8 07/26/2008 2345   RDW 14.5 07/28/2021 1116   LYMPHSABS 1.9 07/28/2021 1116   MONOABS 0.8 07/26/2008 2345   EOSABS 0.2 07/28/2021 1116   BASOSABS 0.0 07/28/2021 1116      Latest Ref Rng & Units 07/28/2021   11:16 AM 07/26/2008   11:45 PM  CMP  Glucose 70 - 99 mg/dL 77  88   BUN 6 - 24 mg/dL 10  9   Creatinine 3.41 - 1.00 mg/dL 9.37  9.02   Sodium 409 - 144 mmol/L 139  140   Potassium 3.5 - 5.2 mmol/L 3.9  3.3   Chloride 96 - 106  mmol/L 103  110   CO2 20 - 29 mmol/L 21  26   Calcium 8.7 - 10.2 mg/dL 9.1  9.3   Total Protein 6.0 - 8.5 g/dL 6.9  7.4   Total Bilirubin 0.0 - 1.2 mg/dL <1.6  0.6   Alkaline Phos 44 - 121 IU/L 107  97   AST 0 - 40 IU/L 18  23   ALT 0 - 32 IU/L 19  20      IMAGING:  MRI/MRV brain 03/2019 normal  Current Outpatient Medications on File Prior to Visit  Medication Sig Dispense Refill   amitriptyline (ELAVIL) 50 MG tablet Take 50 mg by mouth daily.     betamethasone acetate-betamethasone sodium phosphate (CELESTONE) 6 (3-3) MG/ML injection Inject into the muscle.     Botulinum Toxin Type A (BOTOX) 200 units SOLR      clobetasol ointment (TEMOVATE) 0.05 % Apply to affected area every night for 4 weeks,  then every other day for 4 weeks and then twice a week for 4 weeks or until resolution. 30 g 5   clotrimazole-betamethasone (LOTRISONE) cream Apply topically 2 (two) times daily.     dicyclomine (BENTYL) 20 MG tablet Take 20 mg by mouth 2 (two) times daily as needed.     DULoxetine (CYMBALTA) 60 MG capsule Take 60 mg by mouth daily.     emollient (BIAFINE) cream Apply topically.     ergocalciferol (VITAMIN D2) 1.25 MG (50000 UT) capsule Take 1 capsule by mouth once a week.     fluticasone (FLONASE) 50 MCG/ACT nasal spray Place 2 sprays into both nostrils daily.     gabapentin (NEURONTIN) 600 MG tablet Take 600 mg by mouth at bedtime.     levocetirizine (XYZAL) 5 MG tablet Take 1 tablet (5 mg total) by mouth daily. 90 tablet 0   metFORMIN (GLUCOPHAGE-XR) 500 MG 24 hr tablet Take 500 mg by mouth daily.     norgestimate-ethinyl estradiol (ORTHO-CYCLEN) 0.25-35 MG-MCG tablet Take 1 tablet by mouth daily. 28 tablet 11   nystatin-triamcinolone ointment (MYCOLOG) Apply 1 application  topically 2 (two) times daily. 30 g 0   olopatadine (PATANOL) 0.1 % ophthalmic solution Place one drop into both eyes 2 (two) times daily.     ondansetron (ZOFRAN-ODT) 4 MG disintegrating tablet Take 1 tablet (4 mg total) by mouth every 8 (eight) hours as needed for nausea or vomiting. 15 tablet 0   pantoprazole (PROTONIX) 40 MG tablet Take by mouth.     predniSONE (DELTASONE) 10 MG tablet Take by mouth.     prochlorperazine (COMPAZINE) 10 MG tablet TAKE 1 TABLET BY MOUTH EVERY DAY AS NEEDED FOR HEADACHE OR NAUSEA     RIMEGEPANT SULFATE PO Take 1 tablet by mouth as needed.     SUMAtriptan (IMITREX) 100 MG tablet Take 100 mg by mouth as directed.     loratadine (CLARITIN) 10 MG tablet Take by mouth. (Patient not taking: Reported on 08/30/2021)     neomycin-polymyxin-hydrocortisone (CORTISPORIN) OTIC solution Apply 2 drops to the ingrown toenail site twice daily. Cover with band-aid. 10 mL 0   norgestrel-ethinyl estradiol  (LO/OVRAL) 0.3-30 MG-MCG tablet Take 1 tablet by mouth daily. (Patient not taking: Reported on 08/30/2021)     No current facility-administered medications on file prior to visit.     Allergies: Allergies  Allergen Reactions   Lanolin Itching and Other (See Comments)   Penicillins Rash and Other (See Comments)   Ca Phosphate-Cholecalciferol Other (See Comments)   Cheese Other (  See Comments)   Guaifenesin Other (See Comments)   Naproxen Other (See Comments)   Omeprazole Other (See Comments)    headache   Other Other (See Comments)   Peanut (Diagnostic) Other (See Comments)   Pork-Derived Products Other (See Comments)   Topiramate Other (See Comments)    Tingling in hands and feet   Clindamycin/Lincomycin Itching   Oxycodone Itching   Tylenol [Acetaminophen] Other (See Comments)    Gives me "migraines".  States she can take it mixed with hydrocodone.      Family History: Family History  Problem Relation Age of Onset   Early death Mother    Bipolar disorder Father    Diabetes Maternal Grandfather    Diabetes Maternal Aunt    Breast cancer Paternal Aunt      Past Medical History: Past Medical History:  Diagnosis Date   Allergy    See list   Anxiety 05/2014   Chronic pain syndrome    Clotting disorder (HCC)    Jan/Feb 23   GERD (gastroesophageal reflux disease)    Migraines    Neuromuscular disorder (HCC)    PCOS (polycystic ovarian syndrome)    PTSD (post-traumatic stress disorder)    Seizures (HCC)    Sinusitis    Sleep apnea    7 years ago, CPAP   Vitamin D deficiency     Past Surgical History Past Surgical History:  Procedure Laterality Date   EXPLORATORY LAPAROTOMY     WISDOM TOOTH EXTRACTION      Social History: Social History   Tobacco Use   Smoking status: Never   Smokeless tobacco: Never  Vaping Use   Vaping Use: Never used  Substance Use Topics   Alcohol use: Yes    Comment: I drink socially every blue moon.   Drug use: Never     ROS: Negative for fevers, chills. Positive for headaches, tremors. All other systems reviewed and negative unless stated otherwise in HPI.   Physical Exam:   Vital Signs: Ht 5\' 5"  (1.651 m)   Wt 297 lb 6 oz (134.9 kg)   BMI 49.49 kg/m  GENERAL: well appearing,in no acute distress,alert SKIN:  Color, texture, turgor normal. No rashes or lesions HEAD:  Normocephalic/atraumatic. CV:  RRR RESP: Normal respiratory effort MSK: +tenderness to palpation over bilateral occiput, neck, or shoulders  NEUROLOGICAL: Mental Status: Alert, oriented to person, place and time,Follows commands Cranial Nerves: PERRL, visual fields intact to confrontation, extraocular movements intact, facial sensation intact, no facial droop or ptosis, hearing grossly intact, no dysarthria Motor: muscle strength 5/5 both upper and lower extremities Reflexes: 2+ throughout Sensation: slightly decreased sensation over left hand, otherwise intact to light touch all 4 extremities Coordination: Finger-to- nose-finger intact bilaterally Gait: normal-based   IMPRESSION: 41 year old female with a history of lumbar radiculopathy, prediabetes who presents for evaluation of daily headaches since January 2021. Her headache presentation is consistent with new daily persistent headache with migraine features. Will order MRI C-spine as she reports radicular pain and paresthesias in her upper extremities. Headaches are less severe with Botox, Elavil, and gabapentin, but she continues to have them daily. Will increase bedtime dose of gabapentin. Nurtec and Imitrex help her headaches somewhat but are inconsistently effective. Will try Zomig for rescue.  PLAN: -MRI C-spine -Prevention: Continue Botox and Elavil 50 mg QHS. Increase gabapentin to 600/600/900 -Rescue: Start Zomig nasal spray. Continue Nurtec PRN -next steps: consider Qulipta for prevention  I spent a total of 63 minutes chart reviewing  and counseling the patient.  Headache education was done. Discussed treatment options including preventive and acute medications, natural supplements, and physical therapy. Discussed medication overuse headache and to limit use of acute treatments to no more than 2 days/week or 10 days/month. Discussed medication side effects, adverse reactions and drug interactions. Written educational materials and patient instructions outlining all of the above were given.  Follow-up: for Botox   Ocie Doyne, MD 08/30/2021   4:10 PM

## 2021-08-30 NOTE — Patient Instructions (Addendum)
MRI C-spine  Increase gabapentin to 600 in the AM, 600 in the PM, and 900 at bedtime Continue Elavil 50 mg QHS Return for Botox  Start Zomig nasal spray as needed for migraines. Do not take this with Imitrex as they are the same type of medication Continue Nurtec as needed for migraines

## 2021-08-30 NOTE — Telephone Encounter (Signed)
Zolmitriptan spray PA, Key: [id number]. Your information has been sent to [employer].

## 2021-08-30 NOTE — Telephone Encounter (Signed)
I sent an rx for tablets to her pharmacy to use instead

## 2021-08-30 NOTE — Telephone Encounter (Signed)
PA Case: [id number], Status: Closed Reason Code: FM Product not covered by this plan. Prior Authorization not available., Closed Rationale: [employer] Prior Authorization team is unable to review this product for a coverage determination as the requested medication is not on the patient's formulary. Please reach out to [employer] directly for this request. [facility], spoke with Alecia Lemming who stated I must call provider line, [phone]. He transferred call, spoke with Asher Muir who stated 5 mg tablets are covered, spray is not covered. Exception to benefit form must be done. [employer] will no longer be active as of 09/22/21. So if spray is approved PA will only last till 09/22/21. She will fax  exception form.

## 2021-08-31 ENCOUNTER — Encounter: Payer: Self-pay | Admitting: Family Medicine

## 2021-08-31 ENCOUNTER — Ambulatory Visit (INDEPENDENT_AMBULATORY_CARE_PROVIDER_SITE_OTHER): Payer: 59 | Admitting: Family Medicine

## 2021-08-31 ENCOUNTER — Other Ambulatory Visit: Payer: Self-pay | Admitting: Obstetrics and Gynecology

## 2021-08-31 ENCOUNTER — Telehealth: Payer: Self-pay | Admitting: Psychiatry

## 2021-08-31 VITALS — BP 99/61 | HR 72 | Temp 97.7°F | Resp 16 | Wt 297.0 lb

## 2021-08-31 DIAGNOSIS — F411 Generalized anxiety disorder: Secondary | ICD-10-CM

## 2021-08-31 DIAGNOSIS — G894 Chronic pain syndrome: Secondary | ICD-10-CM

## 2021-08-31 MED ORDER — DULOXETINE HCL 60 MG PO CPEP: 60.0000 mg | ORAL_CAPSULE | Freq: Every day | ORAL | 2 refills | Status: DC

## 2021-08-31 NOTE — Progress Notes (Signed)
Established Patient Office Visit  Subjective    Patient ID: Theresa Phillips, female    DOB: [birth year]  Age: 41 y.o. MRN: [id number]  CC:  Chief Complaint  Patient presents with   Thyroid Problem    HPI Theresa Phillips presents for follow up. Patient reports that she has been on cymbalta for chronic pain as well as mood issues and would like this med refilled.    Outpatient Encounter Medications as of 08/31/2021  Medication Sig   amitriptyline (ELAVIL) 50 MG tablet Take 50 mg by mouth daily.   betamethasone acetate-betamethasone sodium phosphate (CELESTONE) 6 (3-3) MG/ML injection Inject into the muscle.   Botulinum Toxin Type A (BOTOX) 200 units SOLR    clobetasol ointment (TEMOVATE) 0.05 % Apply to affected area every night for 4 weeks, then every other day for 4 weeks and then twice a week for 4 weeks or until resolution.   clotrimazole-betamethasone (LOTRISONE) cream Apply topically 2 (two) times daily.   dicyclomine (BENTYL) 20 MG tablet Take 20 mg by mouth 2 (two) times daily as needed.   DULoxetine (CYMBALTA) 60 MG capsule Take 1 capsule (60 mg total) by mouth daily.   emollient (BIAFINE) cream Apply topically.   ergocalciferol (VITAMIN D2) 1.25 MG (50000 UT) capsule Take 1 capsule by mouth once a week.   fluticasone (FLONASE) 50 MCG/ACT nasal spray Place 2 sprays into both nostrils daily.   gabapentin (NEURONTIN) 600 MG tablet Take 600 mg by mouth at bedtime.   levocetirizine (XYZAL) 5 MG tablet Take 1 tablet (5 mg total) by mouth daily.   metFORMIN (GLUCOPHAGE-XR) 500 MG 24 hr tablet Take 500 mg by mouth daily.   norgestimate-ethinyl estradiol (ORTHO-CYCLEN) 0.25-35 MG-MCG tablet Take 1 tablet by mouth daily.   nystatin-triamcinolone ointment (MYCOLOG) Apply 1 application  topically 2 (two) times daily.   olopatadine (PATANOL) 0.1 % ophthalmic solution Place one drop into both eyes 2 (two) times daily.   ondansetron (ZOFRAN-ODT) 4 MG disintegrating tablet Take 1 tablet (4 mg  total) by mouth every 8 (eight) hours as needed for nausea or vomiting.   pantoprazole (PROTONIX) 40 MG tablet Take by mouth.   predniSONE (DELTASONE) 10 MG tablet Take by mouth.   prochlorperazine (COMPAZINE) 10 MG tablet TAKE 1 TABLET BY MOUTH EVERY DAY AS NEEDED FOR HEADACHE OR NAUSEA   RIMEGEPANT SULFATE PO Take 1 tablet by mouth as needed.   SUMAtriptan (IMITREX) 100 MG tablet Take 100 mg by mouth as directed.   zolmitriptan (ZOMIG) 5 MG tablet Take 1 tablet (5 mg total) by mouth as needed for migraine. May repeat a dose in 2 hours if headache persists   [DISCONTINUED] DULoxetine (CYMBALTA) 60 MG capsule Take 60 mg by mouth daily.   No facility-administered encounter medications on file as of 08/31/2021.    Past Medical History:  Diagnosis Date   Allergy    See list   Anxiety 05/2014   Chronic pain syndrome    Clotting disorder (HCC)    Jan/Feb 23   GERD (gastroesophageal reflux disease)    Migraines    Neuromuscular disorder (HCC)    PCOS (polycystic ovarian syndrome)    PTSD (post-traumatic stress disorder)    Seizures (HCC)    Sinusitis    Sleep apnea    7 years ago, CPAP   Vitamin D deficiency     Past Surgical History:  Procedure Laterality Date   EXPLORATORY LAPAROTOMY     WISDOM TOOTH EXTRACTION      Family  History  Problem Relation Age of Onset   Early death Mother    Bipolar disorder Father    Diabetes Maternal Grandfather    Diabetes Maternal Aunt    Breast cancer Paternal Aunt     Social History   Socioeconomic History   Marital status: Single    Spouse name: Not on file   Number of children: 0   Years of education: Not on file   Highest education level: Not on file  Occupational History    Comment: business consultant    Comment: Geologist, engineering  Tobacco Use   Smoking status: Never   Smokeless tobacco: Never  Vaping Use   Vaping Use: Never used  Substance and Sexual Activity   Alcohol use: Yes    Comment: I drink socially every blue  moon.   Drug use: Never   Sexual activity: Not Currently    Birth control/protection: Abstinence, Condom, Pill  Other Topics Concern   Not on file  Social History Narrative   Lives with room mate   Social Determinants of Health   Financial Resource Strain: Not on file  Food Insecurity: Not on file  Transportation Needs: Not on file  Physical Activity: Not on file  Stress: Not on file  Social Connections: Not on file  Intimate Partner Violence: Not on file    Review of Systems  All other systems reviewed and are negative.       Objective    BP 99/61   Pulse 72   Temp 97.7 F (36.5 C) (Oral)   Resp 16   Wt 297 lb (134.7 kg)   SpO2 97%   BMI 49.42 kg/m   Physical Exam Vitals and nursing note reviewed.  Constitutional:      General: She is not in acute distress. Cardiovascular:     Rate and Rhythm: Normal rate and regular rhythm.  Pulmonary:     Effort: Pulmonary effort is normal.     Breath sounds: Normal breath sounds.  Abdominal:     Palpations: Abdomen is soft.     Tenderness: There is no abdominal tenderness.  Neurological:     General: No focal deficit present.     Mental Status: She is alert and oriented to person, place, and time.  Psychiatric:        Mood and Affect: Affect normal. Mood is anxious.        Behavior: Behavior normal.         Assessment & Plan:   1. Chronic pain syndrome Cymbalta 60 mg prescribed.  2. Anxiety state As above    No follow-ups on file.   Tommie Raymond, MD

## 2021-08-31 NOTE — Telephone Encounter (Signed)
I called pt to get her Botox injection scheduled. She states her [employer] will run out on 09/22/2021 so she decided to wait and schedule whenever she gets new insurance.

## 2021-08-31 NOTE — Telephone Encounter (Signed)
Received approval from [employer], [place] # [id number] (08/31/2021-12/01/2021).

## 2021-08-31 NOTE — Telephone Encounter (Signed)
Initiated PA, faxed PA with OV notes to [employer] @ [phone].

## 2021-09-01 ENCOUNTER — Encounter: Payer: Self-pay | Admitting: Obstetrics & Gynecology

## 2021-09-01 NOTE — Telephone Encounter (Signed)
I spoke with the patient she informed me that 09/22/21 will only be 9 weeks for her to get the botox and we need it to be atleast 12 weeks.   She stated that she is suppose to find out on Monday 09/05/21 what her new insurance will be. She stated once she finds out she will provider [place] with that information so we can start the PA process for her new insurance.

## 2021-09-05 ENCOUNTER — Other Ambulatory Visit (HOSPITAL_COMMUNITY)
Admission: RE | Admit: 2021-09-05 | Discharge: 2021-09-05 | Disposition: A | Discharge status: Home / Self Care | Payer: 59 | Source: Ambulatory Visit | Attending: Obstetrics and Gynecology | Admitting: Obstetrics and Gynecology

## 2021-09-05 ENCOUNTER — Ambulatory Visit: Payer: 59

## 2021-09-05 VITALS — BP 135/55 | HR 91 | Wt 298.0 lb

## 2021-09-05 DIAGNOSIS — N898 Other specified noninflammatory disorders of vagina: Secondary | ICD-10-CM | POA: Diagnosis not present

## 2021-09-05 NOTE — Progress Notes (Signed)
Pt presents with vaginal itching and pain x 1 week. Pt states she is not sexually active. Self swab was sent to the lab. Alois Colgan l [employer], CMA

## 2021-09-06 ENCOUNTER — Telehealth: Payer: Self-pay | Admitting: Psychiatry

## 2021-09-06 ENCOUNTER — Encounter (INDEPENDENT_AMBULATORY_CARE_PROVIDER_SITE_OTHER): Payer: 59 | Admitting: Psychiatry

## 2021-09-06 DIAGNOSIS — G4452 New daily persistent headache (NDPH): Secondary | ICD-10-CM

## 2021-09-06 LAB — CERVICOVAGINAL ANCILLARY ONLY
Bacterial Vaginitis (gardnerella): NEGATIVE
Candida Glabrata: NEGATIVE
Candida Vaginitis: NEGATIVE
Comment: NEGATIVE
Comment: NEGATIVE
Comment: NEGATIVE

## 2021-09-06 NOTE — Telephone Encounter (Signed)
30 mins MRI cervical spine wo contrast Dr. Delena Bali [employer] auth: [id number] exp. 09/05/21-12/06/21 scheduled at [facility] 09/13/21 at 2:30pm

## 2021-09-06 NOTE — Telephone Encounter (Signed)
Patient called and asked to schedule botox appointment since her last injection in [place] was on June 23. 2023. [employer] auth: [id number] exp. 08/31/21-12/01/21. I made her appointment and scheduled botox delivery with [facility] for 09/07/21. Their phone number is [phone] and their fax is [phone].

## 2021-09-07 ENCOUNTER — Telehealth: Payer: Self-pay | Admitting: Psychiatry

## 2021-09-07 MED ORDER — GABAPENTIN 300 MG PO CAPS: 300.0000 mg | ORAL_CAPSULE | Freq: Every day | ORAL | 6 refills | Status: DC

## 2021-09-07 NOTE — Telephone Encounter (Signed)
Consult notes have been faxed over to Dr. Katherene Ponto [phone]. Received confirmation.

## 2021-09-07 NOTE — Telephone Encounter (Signed)
Nancy/ [facility] is calling and asking if consult notes can be sent to Dr. Katherene Ponto at [phone]

## 2021-09-07 NOTE — Telephone Encounter (Signed)
Please see the MyChart message reply(ies) for my assessment and plan.    This patient gave consent for this Medical Advice Message and is aware that it may result in a bill to [employer], as well as the possibility of receiving a bill for a co-payment or deductible. They are an established patient, but are not seeking medical advice exclusively about a problem treated during an in person or video visit in the last seven days. I did not recommend an in person or video visit within seven days of my reply.    I spent a total of 5 minutes cumulative time within 7 days through [employer].  Ocie Doyne, MD  09/07/21

## 2021-09-08 ENCOUNTER — Encounter: Payer: Self-pay | Admitting: Obstetrics and Gynecology

## 2021-09-13 ENCOUNTER — Telehealth: Payer: Self-pay | Admitting: Psychiatry

## 2021-09-13 ENCOUNTER — Ambulatory Visit (INDEPENDENT_AMBULATORY_CARE_PROVIDER_SITE_OTHER): Payer: 59

## 2021-09-13 DIAGNOSIS — M5412 Radiculopathy, cervical region: Secondary | ICD-10-CM | POA: Diagnosis not present

## 2021-09-13 NOTE — Telephone Encounter (Signed)
Pt called to notify got off on the wrong road and is 2 mins away. Informed pt may have to reschedule MRI.

## 2021-09-19 ENCOUNTER — Encounter: Payer: Self-pay | Admitting: Psychiatry

## 2021-09-19 DIAGNOSIS — R202 Paresthesia of skin: Secondary | ICD-10-CM

## 2021-09-21 ENCOUNTER — Encounter: Payer: 59 | Attending: Obstetrics and Gynecology | Admitting: Registered"

## 2021-09-21 ENCOUNTER — Telehealth: Payer: Self-pay | Admitting: Psychiatry

## 2021-09-21 DIAGNOSIS — E282 Polycystic ovarian syndrome: Secondary | ICD-10-CM | POA: Insufficient documentation

## 2021-09-21 NOTE — Telephone Encounter (Signed)
Fax sent to [facility] for NCV/EMG scheduling  

## 2021-09-21 NOTE — Patient Instructions (Signed)
For POCS and insulin resistance: Metformin and inositol Consider taking omega 3 to help with inflammation Next visit we'll talk more about diet to help with inflammation.

## 2021-09-21 NOTE — Progress Notes (Signed)
Medical Nutrition Therapy  Appointment Start time:  [phone]  Appointment End time:  1225  Primary concerns today: Weight gain due to PCOS and other digestive issues Referral diagnosis: PCOS Preferred learning style: no preference indicated Learning readiness: ready, change in progress  NUTRITION ASSESSMENT  Anthropometrics  Not assessed this visit  Clinical Medical WT:UUEK  migraines, low normal Vitamin B12 Medications:  Metformin 500 mg (wants to take fewer meds, stopped recently) Protonix, Pepcid & Carafate 3x/day 1 mg (has improved GERD sxs but not under control) Migraine: nurtec 75 mg, imitrex, Zomig, botox, companzine, gabapentin - nerves pain and ear, gums Vitamin D 50,000 weekly (on going) Labs: 5.9% (07/2021), TSH 07/28/21 4.9 (H); tested again 08/15/21 2.9 (WNL) Notable Signs/Symptoms: constant migraine and body pain, hirsutism, low energy, irregular menstrual cycle  Lifestyle & Dietary Hx Pt states PCOS dx ~5-7 yrs ago. Pt reports significant weight gain in the last 3 years and has had several health issues and chronic pain that she believes contributed.   Pt states menstrual cycle was normal until ~41 years old. Pt states she does not know if fertility was an issue, was not trying to conceive. Pt reports family history of diabetes.   Pt states in Jan 2021 her migraine became 24/7. Pt states magnesium did not help sxs. When migraine pain was high, stomach pain was also high. Pt reports MRI showed spot on brain that is consistent with people who have migraines, gray lesions but has not grown significantly, otherwise normal. CAT scat 1 yr ago after fall at aunt's house, was normal.  Pt states she stopped taking metformin a couple of weeks ago because she doesn't have diabetes and wanted to reduce the amount of medication she is taking.  Pt states birth control has not regulated her period and states Dr. Jolayne Panther told her that she is not producing enough estrogen to have a cycle and  prescribed OCP with higher amount of estrogen and if cycle  doesn't improve in the next few weeks will contact Dr Jolayne Panther.  Pt states she is cold natured. Past couple of months as been so cold has been wearing a hoodie. Pt states her thyroid was checked and was WNL.  GI issue details: Sharp pain in stomach, fist size near [employer], or radiating around the center, or sometimes the whole stomach hurts, also has had fluttering feeling close to belly button a few times. Pt states she saw [facility] but did not go into sxs details with MD. Pt states endoscopy 1 month ago and was told her that it was inflamed but biopsy came back normal. Pt reports follow-up with colonoscopy next few months. Pt states takes medication to help regularity. Pt reports BM straining even with medicine, sometimes has diarrhea.  Diet:  Pt reports she has cut back on fried food and sugar, eating more fruit and vegetables.  Food Avoided: (some foods worsen migraine sxs, some allergy related) seafood, pork, mango, sharp cheese, almonds, tumeric, watermelon (seedless? Hasn't tried seeded) Doesn't like raw vegetables but trying to eat more salad  Foods will eat: walnuts & pecans, kisi occasionally, tomatoes sometimes, tomato sauce okay.   Estimated daily fluid intake: not assessed oz Supplements: not assessed - likes to do home remedies Sleep: not assessed Stress / self-care: not assessed Current average weekly physical activity: ADL  24-Hr Dietary Recall  not assessed First Meal:  Snack:  Second Meal:  Snack:  Third Meal:  Snack:  Beverages:   NUTRITION DIAGNOSIS  NB-1.1 Food  and nutrition-related knowledge deficit As related to insulin resistance, carbs, and PCOS.  As evidenced by elevated A1c (5.9%), stopped Metformin due to believe only for diabetes.  NUTRITION INTERVENTION  Nutrition education (E-1) on the following topics:  PCOS pathophysiology Metformin & inositol Omega 3 sources  Handouts  Provided Include  A1c chart PCOS general Inositol and PCOS PCOS tips Ovasitol brochure with code  Learning Style & Readiness for Change Teaching method utilized: Visual & Auditory  Demonstrated degree of understanding via: Teach Back  Barriers to learning/adherence to lifestyle change: none  Goals Established by Pt For POCS and insulin resistance: Metformin and inositol Consider taking omega 3 to help with inflammation Next visit we'll talk more about diet to help with inflammation.  MONITORING & EVALUATION Dietary intake, weekly physical activity, and PCOS sxs in 4-6 weeks.

## 2021-09-22 NOTE — Telephone Encounter (Signed)
The patient called me back and left a message stating that she had a ncs/emg done at [facility] in the past and asked if we can get the results from them. She also states that she refuses to be seen my [facility] again in the future.

## 2021-09-22 NOTE — Telephone Encounter (Signed)
We need her new insurance information as [employer] is no longer in service. [facility] also needs this info, they do not take [facility]. Asking the patient to call [facility] and give them her insurance info since we do not have it. Darl Pikes from [facility] asked for a call back [phone]

## 2021-09-23 ENCOUNTER — Other Ambulatory Visit: Payer: Self-pay | Admitting: Family Medicine

## 2021-09-25 ENCOUNTER — Other Ambulatory Visit: Payer: Self-pay | Admitting: Family Medicine

## 2021-09-25 DIAGNOSIS — J309 Allergic rhinitis, unspecified: Secondary | ICD-10-CM

## 2021-09-29 ENCOUNTER — Telehealth: Payer: Self-pay | Admitting: Psychiatry

## 2021-09-29 ENCOUNTER — Encounter: Payer: Self-pay | Admitting: Family Medicine

## 2021-09-29 NOTE — Telephone Encounter (Signed)
Pt has called to provide her new Insurance information Effective Date 09-23-21 [name] Member PV#[id number] RxBin#[id number] RxPCN#[id number] Issuer#[id number]

## 2021-09-29 NOTE — Telephone Encounter (Signed)
Per the patient, [employer] says since their company ended 09/22/21 they will not be paying for the botox and a new PA needs to be done through [place]. Please update the patient when this is complete. We have already received her botox 200 units here from [facility].

## 2021-09-29 NOTE — Telephone Encounter (Signed)
Noted, she has Botox injection apt 10/13/21, [employer] auth: [id number] exp. 08/31/21-12/01/21.

## 2021-10-02 ENCOUNTER — Encounter: Payer: Self-pay | Admitting: Psychiatry

## 2021-10-03 ENCOUNTER — Other Ambulatory Visit: Payer: Self-pay

## 2021-10-03 MED ORDER — GABAPENTIN 600 MG PO TABS: 600.0000 mg | ORAL_TABLET | Freq: Every day | ORAL | 5 refills | Status: DC

## 2021-10-03 NOTE — Telephone Encounter (Addendum)
Noted, will send Botox reauth to [facility] PA team closer to when her next dose is due in Dec 2023. Copy of insurance letter sent to medical records for scanning.

## 2021-10-05 ENCOUNTER — Telehealth: Payer: Self-pay | Admitting: *Deleted

## 2021-10-05 NOTE — Telephone Encounter (Signed)
Patient has new insurance with [employer], zolmitriptan PA, Key: [id number], [id number].  Your information has been submitted and will be reviewed by [place]. An electronic determination will be received in CoverMyMeds within 72-120 hours.  You will receive a fax copy of the determination. If [name] has not responded in 120 hours, contact [employer] at [phone].

## 2021-10-06 ENCOUNTER — Ambulatory Visit: Payer: Self-pay

## 2021-10-06 NOTE — Telephone Encounter (Signed)
  Chief Complaint: Knee pain Symptoms: knee pain usually  constant dull pain. Sometimes very sharp pain  Frequency: 1 year Pertinent Negatives: Patient denies fever, redness swelling Disposition: [] ED /[] Urgent Care (no appt availability in office) / [x] Appointment(In office/virtual)/ []  [place] [facility]/ [] Home Care/ [] Refused Recommended Disposition /[] [place] [facility]/ []  Follow-up with PCP Additional Notes: PT injured her knees about 1.5 years ago and has a constant dull pain. About 1 year ago pt noticed sudden sharp pain 10/10 when getting up and walking sometimes. Recently pt that this has been increasing in frequency.. No swelling redness or fever.     Summary: Pain in her knees   Pt called requesting to be seen for pain in her knees, sharp pains when she stands up after sitting. Not severe, approved by NT to send clinical call  Best contact: [phone]      Reason for Disposition  Knee pain is a chronic symptom (recurrent or ongoing AND present > 4 weeks)  Answer Assessment - Initial Assessment Questions 1. LOCATION and RADIATION: "Where is the pain located?"      Both knees 2. QUALITY: "What does the pain feel like?"  (e.g., sharp, dull, aching, burning)     Dull pain - sometimes a sharp pain 3. SEVERITY: "How bad is the pain?" "What does it keep you from doing?"   (Scale 1-10; or mild, moderate, severe)   -  MILD (1-3): doesn't interfere with normal activities    -  MODERATE (4-7): interferes with normal activities (e.g., work or school) or awakens from sleep, limping    -  SEVERE (8-10): excruciating pain, unable to do any normal activities, unable to walk     Dull pain - 3/10. is a 15/10 4. ONSET: "When did the pain start?" "Does it come and go, or is it there all the time?"     1.5 years 5. RECURRENT: "Have you had this pain before?" If Yes, ask: "When, and what happened then?"     Sharp pain started nearly a year. 6. SETTING: "Has there been any  recent work, exercise or other activity that involved that part of the body?"      no 7. AGGRAVATING FACTORS: "What makes the knee pain worse?" (e.g., walking, climbing stairs, running)     Sitting. 8. ASSOCIATED SYMPTOMS: "Is there any swelling or redness of the knee?"     no 9. OTHER SYMPTOMS: "Do you have any other symptoms?" (e.g., chest pain, difficulty breathing, fever, calf pain)      10. PREGNANCY: "Is there any chance you are pregnant?" "When was your last menstrual period?"       na  Protocols used: Knee Pain-A-AH

## 2021-10-09 ENCOUNTER — Encounter: Payer: Self-pay | Admitting: Obstetrics and Gynecology

## 2021-10-10 NOTE — Telephone Encounter (Signed)
Do you know whats going on with her botox ?

## 2021-10-10 NOTE — Telephone Encounter (Signed)
I called patient to schedule a sleep consultation. Stated she sent a message regarding her BOTOX and would like a call back regarding this as her appt is Thursday.

## 2021-10-11 NOTE — Telephone Encounter (Signed)
Per phone note from 9/7. [employer] had already sent the botox here from [facility], we have it, but she has new insurance that the botox needs to be approved with since [employer] is no longer in business. Can you all submit PA to new insurance please?

## 2021-10-12 ENCOUNTER — Ambulatory Visit (INDEPENDENT_AMBULATORY_CARE_PROVIDER_SITE_OTHER): Payer: Commercial Managed Care - HMO | Admitting: Physician Assistant

## 2021-10-12 ENCOUNTER — Encounter: Payer: Self-pay | Admitting: Physician Assistant

## 2021-10-12 VITALS — BP 138/87 | HR 111 | Temp 98.5°F | Resp 16 | Wt 293.4 lb

## 2021-10-12 DIAGNOSIS — G894 Chronic pain syndrome: Secondary | ICD-10-CM

## 2021-10-12 MED ORDER — CYCLOBENZAPRINE HCL 5 MG PO TABS: 5.0000 mg | ORAL_TABLET | Freq: Three times a day (TID) | ORAL | 1 refills | Status: DC | PRN

## 2021-10-12 MED ORDER — IBUPROFEN 600 MG PO TABS: 600.0000 mg | ORAL_TABLET | Freq: Three times a day (TID) | ORAL | 1 refills | Status: DC | PRN

## 2021-10-12 MED ORDER — TRAMADOL HCL 50 MG PO TABS: 50.0000 mg | ORAL_TABLET | Freq: Three times a day (TID) | ORAL | 0 refills | Status: AC | PRN

## 2021-10-12 NOTE — Progress Notes (Signed)
Patient is here to talk about seeing a pain specialist. Patient has no other concerns today.

## 2021-10-12 NOTE — Telephone Encounter (Signed)
Staff message was also sent 10/11/21 per Rickey Barbara, Verdene Lennert, CMA  Lenon Oms, CPhT [place],   I sent over a PA for botox because a pts previous insurance covered it, well they went out of business. We have the botox in office, they sent it prior to going out of business. Her injection is scheduled Thursday but not sure if the injection will be covered. Can you assist with this please ?

## 2021-10-12 NOTE — Progress Notes (Signed)
Patient ID: Theresa Phillips, female   DOB: 1980-12-16, 41 y.o.   MRN: [id number]   Theresa Phillips, is a 41 y.o. female  [id number]  [id number]  DOB - Dec 06, 1980  No chief complaint on file.      Subjective:   Theresa Phillips is a 41 y.o. female here today for continued pain with B knees, B shoulders and R hip.  She is unable to get good management of the pain.  She CAN take ibuprofen.  She has failed meloxicam, mehtocarbamol, and diclofenac.  Hydrocodone has helped.  She had an injury that precipitated the various joint pain.    No problems updated.  ALLERGIES: Allergies  Allergen Reactions   Lanolin Itching and Other (See Comments)   Penicillins Rash and Other (See Comments)   Ca Phosphate-Cholecalciferol Other (See Comments)   Cheese Other (See Comments)   Guaifenesin Other (See Comments)   Naproxen Other (See Comments)    Precipitates migraines but other NSAIDS ok   Omeprazole Other (See Comments)    headache   Other Other (See Comments)   Peanut (Diagnostic) Other (See Comments)   Pork-Derived Products Other (See Comments)   Topiramate Other (See Comments)    Tingling in hands and feet   Clindamycin/Lincomycin Itching   Oxycodone Itching   Tylenol [Acetaminophen] Other (See Comments)    Gives me "migraines".  States she can take it mixed with hydrocodone.      PAST MEDICAL HISTORY: Past Medical History:  Diagnosis Date   Allergy    See list   Anxiety 05/2014   Chronic pain syndrome    Clotting disorder (HCC)    Jan/Feb 23   GERD (gastroesophageal reflux disease)    Migraines    Neuromuscular disorder (HCC)    PCOS (polycystic ovarian syndrome)    PTSD (post-traumatic stress disorder)    Seizures (HCC)    Sinusitis    Sleep apnea    7 years ago, CPAP   Vitamin D deficiency     MEDICATIONS AT HOME: Prior to Admission medications   Medication Sig Start Date End Date Taking? Authorizing Provider  amitriptyline (ELAVIL) 50 MG tablet Take 50 mg by mouth  daily. 03/04/21  Yes [provider]  betamethasone acetate-betamethasone sodium phosphate (CELESTONE) 6 (3-3) MG/ML injection Inject into the muscle. 01/26/20  Yes [provider]  Botulinum Toxin Type A (BOTOX) 200 units SOLR  11/18/19  Yes [provider]  clobetasol ointment (TEMOVATE) 0.05 % Apply to affected area every night for 4 weeks, then every other day for 4 weeks and then twice a week for 4 weeks or until resolution. 08/09/21  Yes Constant, Peggy, MD  clotrimazole-betamethasone (LOTRISONE) cream Apply topically 2 (two) times daily. 08/19/19  Yes [provider]  cyclobenzaprine (FLEXERIL) 5 MG tablet Take 1 tablet (5 mg total) by mouth 3 (three) times daily as needed for muscle spasms. 10/12/21  Yes Rubi Tooley M, PA-C  dicyclomine (BENTYL) 20 MG tablet Take 20 mg by mouth 2 (two) times daily as needed. 04/01/21  Yes [provider]  DULoxetine (CYMBALTA) 60 MG capsule Take 1 capsule (60 mg total) by mouth daily. 08/31/21  Yes Dorna Mai, MD  emollient (BIAFINE) cream Apply topically. 12/09/15  Yes [provider]  ergocalciferol (VITAMIN D2) 1.25 MG (50000 UT) capsule Take 1 capsule by mouth once a week. 05/09/18  Yes [provider]  famotidine (PEPCID) 40 MG tablet Take 40 mg by mouth daily.   Yes [provider]  fluticasone (  FLONASE) 50 MCG/ACT nasal spray Place 2 sprays into both nostrils daily.   Yes [provider]  gabapentin (NEURONTIN) 300 MG capsule Take 1 capsule (300 mg total) by mouth at bedtime. Take with 600 mg pill for a total of 900 mg at bedtime. 09/07/21  Yes Genia Harold, MD  gabapentin (NEURONTIN) 600 MG tablet Take 1 tablet (600 mg total) by mouth at bedtime. 10/03/21  Yes Genia Harold, MD  ibuprofen (ADVIL) 600 MG tablet Take 1 tablet (600 mg total) by mouth every 8 (eight) hours as needed. 10/12/21  Yes Trishelle Devora M, PA-C  levocetirizine (XYZAL) 5 MG tablet TAKE 1 TABLET (5 MG  TOTAL) BY MOUTH DAILY. 09/27/21  Yes Dorna Mai, MD  metFORMIN (GLUCOPHAGE-XR) 500 MG 24 hr tablet Take 500 mg by mouth daily. 02/21/21  Yes [provider]  norgestimate-ethinyl estradiol (ORTHO-CYCLEN) 0.25-35 MG-MCG tablet Take 1 tablet by mouth daily. 07/01/21  Yes Constant, Peggy, MD  nystatin-triamcinolone ointment (MYCOLOG) Apply 1 application  topically 2 (two) times daily. 07/01/21  Yes Constant, Peggy, MD  olopatadine (PATANOL) 0.1 % ophthalmic solution Place one drop into both eyes 2 (two) times daily. 03/12/19  Yes [provider]  ondansetron (ZOFRAN-ODT) 4 MG disintegrating tablet Take 1 tablet (4 mg total) by mouth every 8 (eight) hours as needed for nausea or vomiting. 03/06/18  Yes Hagler, Aaron Edelman, MD  pantoprazole (PROTONIX) 40 MG tablet Take by mouth. 11/12/19  Yes [provider]  predniSONE (DELTASONE) 10 MG tablet Take by mouth. 07/29/19  Yes [provider]  prochlorperazine (COMPAZINE) 10 MG tablet TAKE 1 TABLET BY MOUTH EVERY DAY AS NEEDED FOR HEADACHE OR NAUSEA 09/12/19  Yes [provider]  RIMEGEPANT SULFATE PO Take 1 tablet by mouth as needed.   Yes [provider]  sucralfate (CARAFATE) 1 g tablet Take 1 g by mouth 2 (two) times daily.   Yes [provider]  SUMAtriptan (IMITREX) 100 MG tablet Take 100 mg by mouth as directed. 01/12/20  Yes [provider]  traMADol (ULTRAM) 50 MG tablet Take 1 tablet (50 mg total) by mouth every 8 (eight) hours as needed for up to 5 days. 10/12/21 10/17/21 Yes Micheal Sheen, Dionne Bucy, PA-C  zolmitriptan (ZOMIG) 5 MG tablet Take 1 tablet (5 mg total) by mouth as needed for migraine. May repeat a dose in 2 hours if headache persists 08/30/21  Yes Chima, Anderson Malta, MD    ROS: Neg HEENT Neg resp Neg cardiac Neg GI Neg GU Neg psych Neg neuro  Objective:   Vitals:   10/12/21 1309  BP: 138/87  Pulse: (!) 111  Resp: 16  Temp: 98.5 F (36.9 C)  TempSrc: Oral  SpO2: 95%   Weight: 293 lb 6.4 oz (133.1 kg)   Exam General appearance : Awake, alert, not in any distress. Speech Clear. Not toxic looking HEENT: Atraumatic and Normocephalic Neck: Supple, no JVD. No cervical lymphadenopathy.  Chest: Good air entry bilaterally, CTAB.  No rales/rhonchi/wheezing CVS: S1 S2 regular, no murmurs.  Extremities: B/L Lower Ext shows no edema, both legs are warm to touch Neurology: Awake alert, and oriented X 3, CN II-XII intact, Non focal Skin: No Rash  Data Review Lab Results  Component Value Date   HGBA1C 5.9 (H) 07/28/2021    Assessment & Plan   1. Chronic pain syndrome Reviewed ecent labs - cyclobenzaprine (FLEXERIL) 5 MG tablet; Take 1 tablet (5 mg total) by mouth 3 (three) times daily as needed for muscle spasms.  Dispense:  30 tablet; Refill: 1 - traMADol (ULTRAM) 50 MG tablet; Take 1 tablet (50 mg total) by mouth every 8 (eight) hours as needed for up to 5 days.  Dispense: 15 tablet; Refill: 0 - ibuprofen (ADVIL) 600 MG tablet; Take 1 tablet (600 mg total) by mouth every 8 (eight) hours as needed.  Dispense: 60 tablet; Refill: 1 - Ambulatory referral to Pain Clinic    Return if symptoms worsen or fail to improve.  The patient was given clear instructions to go to ER or return to medical center if symptoms don't improve, worsen or new problems develop. The patient verbalized understanding. The patient was told to call to get lab results if they haven't heard anything in the next week.      Freeman Caldron, PA-C [facility] [place], [place]   10/12/2021, 1:29 PM

## 2021-10-13 ENCOUNTER — Ambulatory Visit: Payer: 59 | Admitting: Psychiatry

## 2021-10-17 NOTE — Telephone Encounter (Signed)
[employer], spoke with Tillie Rung to check status of zolmitriptan PA. Sent to [employer] for review and decision. No additional information needed, pending case #[id number].

## 2021-10-19 ENCOUNTER — Other Ambulatory Visit (HOSPITAL_COMMUNITY): Payer: Self-pay

## 2021-10-20 DIAGNOSIS — M542 Cervicalgia: Secondary | ICD-10-CM | POA: Diagnosis not present

## 2021-10-20 DIAGNOSIS — G8929 Other chronic pain: Secondary | ICD-10-CM | POA: Diagnosis not present

## 2021-10-20 DIAGNOSIS — M25562 Pain in left knee: Secondary | ICD-10-CM | POA: Diagnosis not present

## 2021-10-20 DIAGNOSIS — M25551 Pain in right hip: Secondary | ICD-10-CM | POA: Diagnosis not present

## 2021-10-20 DIAGNOSIS — M25511 Pain in right shoulder: Secondary | ICD-10-CM | POA: Diagnosis not present

## 2021-10-20 DIAGNOSIS — Z79899 Other long term (current) drug therapy: Secondary | ICD-10-CM | POA: Diagnosis not present

## 2021-10-20 DIAGNOSIS — Z87828 Personal history of other (healed) physical injury and trauma: Secondary | ICD-10-CM | POA: Diagnosis not present

## 2021-10-20 DIAGNOSIS — Z6841 Body Mass Index (BMI) 40.0 and over, adult: Secondary | ICD-10-CM | POA: Diagnosis not present

## 2021-10-20 DIAGNOSIS — G43109 Migraine with aura, not intractable, without status migrainosus: Secondary | ICD-10-CM | POA: Diagnosis not present

## 2021-10-20 DIAGNOSIS — Z9181 History of falling: Secondary | ICD-10-CM | POA: Diagnosis not present

## 2021-10-20 DIAGNOSIS — M129 Arthropathy, unspecified: Secondary | ICD-10-CM | POA: Diagnosis not present

## 2021-10-20 DIAGNOSIS — M25561 Pain in right knee: Secondary | ICD-10-CM | POA: Diagnosis not present

## 2021-10-21 ENCOUNTER — Telehealth (HOSPITAL_COMMUNITY): Payer: Self-pay

## 2021-10-21 NOTE — Telephone Encounter (Signed)
[employer] verification submitted   Key # [id number]

## 2021-10-21 NOTE — Telephone Encounter (Signed)
PA form faxed to [facility]. Will update once we receive a response.  Phone# [phone]  Fax# [phone]

## 2021-10-24 ENCOUNTER — Encounter: Payer: Self-pay | Admitting: *Deleted

## 2021-10-24 NOTE — Telephone Encounter (Signed)
Zolmitriptan 5 mg denied, stated quantity limit not covered, only covers 6 tabs for a month. Rx is for 10 tabs. Called [facility] spoke with Keenan Bachelor who stated insurance limits her to 6 tabs a month. Sent her my chart to advise, ask if she will try that quantity.

## 2021-10-25 ENCOUNTER — Telehealth (HOSPITAL_BASED_OUTPATIENT_CLINIC_OR_DEPARTMENT_OTHER): Payer: Self-pay

## 2021-10-25 ENCOUNTER — Other Ambulatory Visit: Payer: Self-pay

## 2021-10-25 MED ORDER — BOTOX 200 UNITS IJ SOLR: INTRAMUSCULAR | 1 refills | Status: AC

## 2021-10-25 NOTE — Telephone Encounter (Signed)
Rx sent to [facility],  will call and schedule delivery thur.

## 2021-10-25 NOTE — Telephone Encounter (Signed)
Patient Advocate Encounter  Prior Authorization for Botox 200 units has been approved.    PA# [id number] Effective dates: 10/25/2021 through 10/26/2022  Must be filled at [place], [place] Patient Advocate Specialist [place] Patient Advocate Team Direct Number: [phone]  Fax: [phone]

## 2021-10-27 NOTE — Telephone Encounter (Signed)
Contacted [facility]. Spoke to [employer]. He stated Rx is still in process, it will take 5-8 business days to complete and they will also have to contact pt first to get consent for [place] to handle medication. Will call back end of next week to see if pt has given consent to schedule.

## 2021-10-31 ENCOUNTER — Telehealth: Payer: Self-pay | Admitting: Family Medicine

## 2021-10-31 NOTE — Telephone Encounter (Signed)
Please advise patient.  

## 2021-10-31 NOTE — Telephone Encounter (Signed)
Copied from [place] [id number]. Topic: General - Inquiry >> Oct 31, 2021  9:39 AM Devoria Glassing wrote: Reason for CRM: pt wants to know if Dr Redmond Pulling will give a cortisone injection to her shoulder

## 2021-11-01 ENCOUNTER — Telehealth: Payer: Self-pay | Admitting: Psychiatry

## 2021-11-01 ENCOUNTER — Other Ambulatory Visit: Payer: Self-pay | Admitting: Family Medicine

## 2021-11-01 NOTE — Telephone Encounter (Signed)
Addressed in TE 09/29/21

## 2021-11-01 NOTE — Telephone Encounter (Addendum)
Contacted [facility] again, spoke to [place]le. He stated Rx is still in processing and they still have to get patient consent. Will call back end of week to see if pt has given consent to schedule and Rx is done processing.

## 2021-11-01 NOTE — Telephone Encounter (Signed)
Medication Refill - Medication: fluticasone (FLONASE) 50 MCG/ACT nasal spray [[id number]]   Has the patient contacted their pharmacy? No. (Agent: If no, request that the patient contact the pharmacy for the refill. If patient does not wish to contact the pharmacy document the reason why and proceed with request.) (Agent: If yes, when and what did the pharmacy advise?)  Preferred Pharmacy (with phone number or street name): [id number] [place] [place] [address] Has the patient been seen for an appointment in the last year OR does the patient have an upcoming appointment? Yes.    Agent: Please be advised that RX refills may take up to 3 business days. We ask that you follow-up with your pharmacy.

## 2021-11-01 NOTE — Telephone Encounter (Signed)
I have attempted without success to contact this patient by phone to return their call and I left a message on answering machine.

## 2021-11-01 NOTE — Telephone Encounter (Signed)
Requested medication (s) are due for refill today: -  Requested medication (s) are on the active medication list: historical med  Last refill:  03/06/18  Future visit scheduled: no  Notes to clinic:  historical provider   Requested Prescriptions  Pending Prescriptions Disp Refills   fluticasone (FLONASE) 50 MCG/ACT nasal spray      Sig: Place 2 sprays into both nostrils daily.     Ear, Nose, and Throat: Nasal Preparations - Corticosteroids Passed - 11/01/2021 11:59 AM      Passed - Valid encounter within last 12 months    Recent Outpatient Visits           2 weeks ago Chronic pain syndrome   Primary Care at [place], [place]   2 months ago Chronic pain syndrome   Primary Care at [facility], [place]   3 months ago Annual physical exam   Primary Care at [facility] [place], [place]   4 months ago Vaginitis and vulvovaginitis   Primary Care at [facility], [place]

## 2021-11-01 NOTE — Telephone Encounter (Signed)
Pt is calling and wanting to know if her authorization for Botox has came in. Pt is requesting nurse send her a message on mychart.

## 2021-11-02 ENCOUNTER — Encounter: Payer: Self-pay | Admitting: Psychiatry

## 2021-11-03 ENCOUNTER — Ambulatory Visit (INDEPENDENT_AMBULATORY_CARE_PROVIDER_SITE_OTHER): Payer: Commercial Managed Care - HMO | Admitting: Neurology

## 2021-11-03 ENCOUNTER — Encounter: Payer: Self-pay | Admitting: Neurology

## 2021-11-03 VITALS — BP 144/93 | HR 92 | Ht 65.0 in | Wt 295.8 lb

## 2021-11-03 DIAGNOSIS — G4733 Obstructive sleep apnea (adult) (pediatric): Secondary | ICD-10-CM

## 2021-11-03 NOTE — Progress Notes (Addendum)
Subjective:    Patient ID: Theresa Phillips is a 41 y.o. female.  HPI    Star Age, MD, PhD [facility] [address], [address] P.O. [place], [place] [address]  Dear Dr. Ramiro Harvest,  I saw your patient, Theresa Phillips, upon your kind to my sleep clinic today for initial consultation of her sleep disorder, in particular, her prior diagnosis of obstructive sleep apnea.  The patient is unaccompanied today.  As you know, Theresa Phillips is a 41 year old female with an underlying medical history of migraine headaches, PCOS, anxiety, allergies, reflux disease, vitamin D deficiency, and morbid obesity with a BMI of over 63, who was previously diagnosed with obstructive sleep apnea and placed on positive airway pressure treatment.  Prior sleep study results are not available for my review today but in an office visit note from 01/27/2013 she was reported to have had a PSG on 11/26/2012 which revealed moderate to severe obstructive sleep apnea with an AHI of 15.7, O2 nadir not mentioned. A CPAP titration study was ordered not sure if she actually had a titration study.  I reviewed her AutoPap compliance data from the past 90 days from 08/05/2021 through 11/02/2021, during which time she used her machine 83 days but also reports that she uses her old machine as backup when she travels.  She reports not going without treatment.  Average usage of 7 hours and 11 minutes, residual AHI at goal at 0.8/h, leak on the high side fairly consistently with the 95th percentile at 42 L/min but she reports that she has not received any recent supplies.  They have been ordered through her DME company, [employer] but she has not received them yet.  Average pressure for the 95th percentile at 16.5 cm with a range of 12 to 20 cm with EPR of 3.  She has a ResMed air sense 11 AutoSet machine and set up date was 03/22/2020.  This is her second machine, sleep testing was about 7 years ago per her recollection. Her Epworth  sleepiness score is 4/24, fatigue severity score is 9 out of 63.  She lives with her sister.  She has no obvious family history of sleep apnea.  She currently does not work, she is on disability.  Bedtime is generally around 11:30 PM and rise time between 8 and 8:30 AM.  She does not drink any daily caffeine, rare alcohol, she is a non-smoker.  She uses a fullface mask.  Weight has been fluctuating but generally more or less stable. She is followed for her migraines by my colleague, Dr. Billey Gosling.  Addendum, 11/29/2021: I received patient's sleep study report.  Patient had a diagnostic polysomnogram through [facility] on 11/26/2012 with an AHI of 15.7/h, O2 nadir 89%.  Severe sleep fragmentation was noted, sleep efficiency 63.2%, 76.3% stage II sleep, absence of slow-wave sleep, REM sleep was 12.9%.  Her Past Medical History Is Significant For: Past Medical History:  Diagnosis Date   Allergy    See list   Anxiety 05/2014   Chronic pain syndrome    Clotting disorder (HCC)    Jan/Feb 23   GERD (gastroesophageal reflux disease)    Migraines    Neuromuscular disorder (HCC)    PCOS (polycystic ovarian syndrome)    PTSD (post-traumatic stress disorder)    Seizures (HCC)    Sinusitis    Sleep apnea    7 years ago, CPAP   Vitamin D deficiency     Her Past Surgical History Is  Significant For: Past Surgical History:  Procedure Laterality Date   EXPLORATORY LAPAROTOMY     WISDOM TOOTH EXTRACTION      Her Family History Is Significant For: Family History  Problem Relation Age of Onset   Early death Mother    Bipolar disorder Father    Diabetes Maternal Grandfather    Diabetes Maternal Aunt    Breast cancer Paternal Aunt     Her Social History Is Significant For: Social History   Socioeconomic History   Marital status: Single    Spouse name: Not on file   Number of children: 0   Years of education: Not on file   Highest education level: Not on file  Occupational History     Comment: Hydrologist    Comment: Geologist, engineering  Tobacco Use   Smoking status: Never   Smokeless tobacco: Never  Vaping Use   Vaping Use: Never used  Substance and Sexual Activity   Alcohol use: Yes    Comment: I drink socially every blue moon.   Drug use: Never   Sexual activity: Not Currently    Birth control/protection: Abstinence, Condom, Pill  Other Topics Concern   Not on file  Social History Narrative   Lives with room mate   Caffeine none.   Social Determinants of Health   Financial Resource Strain: Not on file  Food Insecurity: No Food Insecurity (09/21/2021)   Hunger Vital Sign    Worried About Running Out of Food in the Last Year: Never true    Ran Out of Food in the Last Year: Never true  Transportation Needs: Not on file  Physical Activity: Not on file  Stress: Not on file  Social Connections: Not on file    Her Allergies Are:  Allergies  Allergen Reactions   Lanolin Itching and Other (See Comments)   Penicillins Rash and Other (See Comments)   Ca Phosphate-Cholecalciferol Other (See Comments)   Cheese Other (See Comments)   Guaifenesin Other (See Comments)   Naproxen Other (See Comments)    Precipitates migraines but other NSAIDS ok   Omeprazole Other (See Comments)    headache   Other Other (See Comments)   Peanut (Diagnostic) Other (See Comments)   Pork-Derived Products Other (See Comments)   Topiramate Other (See Comments)    Tingling in hands and feet   Clindamycin/Lincomycin Itching   Oxycodone Itching   Tylenol [Acetaminophen] Other (See Comments)    Gives me "migraines".  States she can take it mixed with hydrocodone.    :   Her Current Medications Are:  Outpatient Encounter Medications as of 11/03/2021  Medication Sig   amitriptyline (ELAVIL) 50 MG tablet Take 50 mg by mouth daily.   betamethasone acetate-betamethasone sodium phosphate (CELESTONE) 6 (3-3) MG/ML injection Inject into the muscle.   botulinum toxin Type A  (BOTOX) 200 units injection Inject 155 units into the head and neck muscles every 90 days.   clobetasol ointment (TEMOVATE) 0.05 % Apply to affected area every night for 4 weeks, then every other day for 4 weeks and then twice a week for 4 weeks or until resolution.   clotrimazole-betamethasone (LOTRISONE) cream Apply topically 2 (two) times daily.   cyclobenzaprine (FLEXERIL) 5 MG tablet Take 1 tablet (5 mg total) by mouth 3 (three) times daily as needed for muscle spasms.   dicyclomine (BENTYL) 20 MG tablet Take 20 mg by mouth 2 (two) times daily as needed.   DULoxetine (CYMBALTA) 60 MG capsule Take 1 capsule (  60 mg total) by mouth daily.   emollient (BIAFINE) cream Apply topically.   ergocalciferol (VITAMIN D2) 1.25 MG (50000 UT) capsule Take 1 capsule by mouth once a week.   famotidine (PEPCID) 20 MG tablet Take 20 mg by mouth 2 (two) times daily as needed.   fluticasone (FLONASE) 50 MCG/ACT nasal spray Place 2 sprays into both nostrils daily.   gabapentin (NEURONTIN) 300 MG capsule Take 1 capsule (300 mg total) by mouth at bedtime. Take with 600 mg pill for a total of 900 mg at bedtime.   gabapentin (NEURONTIN) 600 MG tablet Take 1 tablet (600 mg total) by mouth at bedtime.   ibuprofen (ADVIL) 600 MG tablet Take 1 tablet (600 mg total) by mouth every 8 (eight) hours as needed.   levocetirizine (XYZAL) 5 MG tablet TAKE 1 TABLET (5 MG TOTAL) BY MOUTH DAILY.   metFORMIN (GLUCOPHAGE-XR) 500 MG 24 hr tablet Take 500 mg by mouth daily.   norgestimate-ethinyl estradiol (ORTHO-CYCLEN) 0.25-35 MG-MCG tablet Take 1 tablet by mouth daily.   nystatin-triamcinolone ointment (MYCOLOG) Apply 1 application  topically 2 (two) times daily.   olopatadine (PATANOL) 0.1 % ophthalmic solution Place one drop into both eyes 2 (two) times daily.   ondansetron (ZOFRAN) 8 MG tablet Take 8 mg by mouth every 8 (eight) hours as needed for nausea or vomiting.   ondansetron (ZOFRAN-ODT) 4 MG disintegrating tablet Take 1  tablet (4 mg total) by mouth every 8 (eight) hours as needed for nausea or vomiting.   pantoprazole (PROTONIX) 40 MG tablet Take by mouth.   predniSONE (DELTASONE) 10 MG tablet Take by mouth.   prochlorperazine (COMPAZINE) 10 MG tablet TAKE 1 TABLET BY MOUTH EVERY DAY AS NEEDED FOR HEADACHE OR NAUSEA   RIMEGEPANT SULFATE PO Take 1 tablet by mouth as needed.   sucralfate (CARAFATE) 1 g tablet Take 1 g by mouth 2 (two) times daily.   SUMAtriptan (IMITREX) 100 MG tablet Take 100 mg by mouth as directed.   zolmitriptan (ZOMIG) 5 MG tablet Take 1 tablet (5 mg total) by mouth as needed for migraine. May repeat a dose in 2 hours if headache persists   [DISCONTINUED] famotidine (PEPCID) 40 MG tablet Take 40 mg by mouth daily.   No facility-administered encounter medications on file as of 11/03/2021.  :   Review of Systems:  Out of a complete 14 point review of systems, all are reviewed and negative with the exception of these symptoms as listed below:  Review of Systems  Neurological:        TOC for Sleep from [facility], Been on CPAP for 7 yrs.  ESS 4, FSS 9.    Objective:  Neurological Exam  Physical Exam Physical Examination:   Vitals:   11/03/21 1121  BP: (!) 144/93  Pulse: 92    General Examination: The patient is a very pleasant 41 y.o. female in no acute distress. She appears well-developed and well-nourished and well groomed.   HEENT: Normocephalic, atraumatic, she wears dark eyeglasses.  Face is symmetric with normal facial animation. Speech is clear with no dysarthria noted. There is no hypophonia. There is no lip, neck/head, jaw or voice tremor. There are no carotid bruits on auscultation. Oropharynx exam reveals: mild mouth dryness, adequate dental hygiene and moderate airway crowding, due to small airway entry and redundant soft palate, Mallampati class III.  Neck circumference of 19-1/4 inches, tonsils on the smaller side.  Tongue protrudes centrally and palate  elevates symmetrically.  Chest: Clear to  auscultation without wheezing, rhonchi or crackles noted.  Heart: S1+S2+0, regular and normal without murmurs, rubs or gallops noted.   Abdomen: Soft, non-tender and non-distended.  Extremities: There is no obvious edema in her extremities.   Skin: Warm and dry without trophic changes noted.   Musculoskeletal: exam reveals no obvious joint deformities.   Neurologically:  Mental status: The patient is awake, alert and oriented in all 4 spheres. Her immediate and remote memory, attention, language skills and fund of knowledge are appropriate. There is no evidence of aphasia, agnosia, apraxia or anomia. Speech is clear with normal prosody and enunciation. Thought process is linear. Mood is normal and affect is normal.  Cranial nerves II - XII are as described above under HEENT exam.  Motor exam: Normal bulk, moves all 4 extremities, no walking aids.  No obvious tremor noted.  Fine motor skills and coordination: grossly intact.  Cerebellar testing: No dysmetria or intention tremor. There is no truncal or gait ataxia.  Gait, station and balance: She stands easily. No veering to one side is noted. No leaning to one side is noted. Posture is age-appropriate and stance is narrow based. Gait shows normal stride length and normal pace. No problems turning are noted.   Assessment and Plan:  In summary, Beyounce Wadley is a very pleasant 41 y.o.-year old female with an underlying medical history of migraine headaches, PCOS, anxiety, allergies, reflux disease, vitamin D deficiency, and morbid obesity with a BMI of over 45, who presents for evaluation of her obstructive sleep apnea which was deemed in the moderate range in 2014.  She has been on AutoPap therapy for years, she received a new machine in February 2022.  She is typically up-to-date with her supplies but needs new supplies, has a local DME company established.  She is presenting for transfer of care due to  insurance requirement.  She is compliant with her AutoPap, apnea scores are good.  She does have a leak from the mask but it will likely improve with a new set of equipment.  She is compliant with treatment and commended for it.  She is advised to follow-up routinely in sleep clinic to see one of our nurse practitioners in 1 year.   I answered all her questions today and she was in agreement.  Thank you very much for allowing me to participate in the care of this nice patient. If I can be of any further assistance to you please do not hesitate to call me at [phone].  Sincerely,   Huston Foley, MD, PhD

## 2021-11-03 NOTE — Telephone Encounter (Signed)
Contacted [facility], they said they did not get consent from patient to ship medication yesterday, as patient informed me via [employer]. They stated she told them to call back Friday because she hasnt not got made appt with [place] yet to do botox. I have informed the pt we cannot get appt made until consent is done. Will contact pt again and reiterate.    Contacted pt, she stated whoever she spoke to said there was was no PA on file and she usually gives consent to ship after appointment. I informed her this is her first botox in this office. Our office we need consent first to make appt. Advised her to please contact [facility] now and give them consent as I personally have been working on this for about two weeks and there is a PA on file. Once consent is made I will contact [facility] back in about a hr to scheduling delivery. Pt verbally understood

## 2021-11-03 NOTE — Patient Instructions (Addendum)
It was nice to meet you today.  You are compliant with your autoPAP machine and your apnea control is good.  Please get in touch with your DME provider, [employer] about your supplies, and will be happy to write for a renewal of your supplies when needed.  Please continue to work on weight loss.  Please continue using your autoPAP regularly. While your insurance requires that you use PAP at least 4 hours each night on 70% of the nights, I recommend, that you not skip any nights and use it throughout the night if you can. Getting used to PAP and staying with the treatment long term does take time and patience and discipline. Untreated obstructive sleep apnea when it is moderate to severe can have an adverse impact on cardiovascular health and raise her risk for heart disease, arrhythmias, hypertension, congestive heart failure, stroke and diabetes. Untreated obstructive sleep apnea causes sleep disruption, nonrestorative sleep, and sleep deprivation. This can have an impact on your day to day functioning and cause daytime sleepiness and impairment of cognitive function, memory loss, mood disturbance, and problems focussing. Using PAP regularly can improve these symptoms.  You can follow-up in 1 year to see one of our nurse practitioners in sleep clinic.

## 2021-11-03 NOTE — Telephone Encounter (Addendum)
Contacted [facility] again, pt gave consent to deliver this morning.  Botox is scheduled to be delivered to office on  October 17th. Address and business hours was verified.  Pt will make botox appt during check out today, After seeing dr Rexene Alberts  SP

## 2021-11-04 ENCOUNTER — Ambulatory Visit: Payer: Self-pay | Admitting: Neurology

## 2021-11-05 ENCOUNTER — Encounter (INDEPENDENT_AMBULATORY_CARE_PROVIDER_SITE_OTHER): Payer: Commercial Managed Care - HMO | Admitting: Psychiatry

## 2021-11-05 DIAGNOSIS — G43711 Chronic migraine without aura, intractable, with status migrainosus: Secondary | ICD-10-CM

## 2021-11-07 ENCOUNTER — Encounter: Payer: Commercial Managed Care - HMO | Attending: Obstetrics and Gynecology | Admitting: Registered"

## 2021-11-07 ENCOUNTER — Encounter: Payer: Self-pay | Admitting: Registered"

## 2021-11-07 DIAGNOSIS — E282 Polycystic ovarian syndrome: Secondary | ICD-10-CM | POA: Insufficient documentation

## 2021-11-07 MED ORDER — ONDANSETRON HCL 8 MG PO TABS: 8.0000 mg | ORAL_TABLET | Freq: Three times a day (TID) | ORAL | 6 refills | Status: DC | PRN

## 2021-11-07 MED ORDER — RIMEGEPANT SULFATE 75 MG PO TBDP: 75.0000 mg | ORAL_TABLET | ORAL | 6 refills | Status: DC | PRN

## 2021-11-07 MED ORDER — GABAPENTIN 600 MG PO TABS: 600.0000 mg | ORAL_TABLET | Freq: Three times a day (TID) | ORAL | 5 refills | Status: DC

## 2021-11-07 NOTE — Telephone Encounter (Signed)
Updated gabapentin rx, zofran and nurtec refills sent

## 2021-11-07 NOTE — Progress Notes (Signed)
Medical Nutrition Therapy  Appointment Start time:  [phone]  Appointment End time:  1225  Primary concerns today: Weight gain due to PCOS and other digestive issues Referral diagnosis: PCOS Preferred learning style: no preference indicated Learning readiness: ready, change in progress  NUTRITION ASSESSMENT  Anthropometrics  Not assessed this visit  Clinical Medical BZ:JIRC  migraines, low normal Vitamin B12 Medications: pt reports no changes Metformin 500 mg pt states she started taking it again Protonix, Pepcid & Carafate 3x/day 1 mg (has improved GERD sxs but not under control) Migraine: nurtec 75 mg, imitrex, Zomig, botox, companzine, gabapentin - nerves pain and ear, gums Vitamin D 50,000 weekly (on going) Labs: 5.9% (07/2021), TSH 07/28/21 4.9 (H); tested again 08/15/21 2.9 (WNL) Notable Signs/Symptoms: constant migraine and body pain, hirsutism, low energy, irregular menstrual cycle  Lifestyle & Dietary Hx Pt states she is aware that tumeric can help reduce inflammation, but she can't take it due to allergy. Pt states she is able to eat [place] yogurt, but regular yogurt (Dannon) triggered a migraine.   Diet:  Pt reports she usually only eats 2x day. Sometimes intake is reduced due to reduced appetite, but sometimes she is hungry but avoids eating due to nausea.  Food Avoided: (some foods worsen migraine sxs, some allergy related) seafood, pork, mango, sharp cheese, almonds, tumeric, watermelon (seedless? Hasn't tried seeded) Doesn't like raw vegetables but trying to eat more salad (continues)  Foods will eat: walnuts & pecans, kisi occasionally, tomatoes sometimes, tomato sauce okay. Potatoes, green beans, spinach, - tries to not eat bread. Whole grain pasta, oatmeal with cinnamon, ginger, butter, to sweeten may add a little sugar or honey or apple.  Estimated daily fluid intake: not assessed oz Supplements: not assessed Sleep: not assessed Stress / self-care: not assessed Current  average weekly physical activity: ADL, limited by hip injury  24-Hr Dietary Recall   First Meal: chobani strawberry yogurt Snack: scrambled egg Second Meal:  Snack:  Third Meal: hotdog Snack: peanuts Beverages:   NUTRITION DIAGNOSIS  NB-1.1 Food and nutrition-related knowledge deficit As related to insulin resistance, carbs, and PCOS.  As evidenced by elevated A1c (5.9%), stopped Metformin due to believe only for diabetes.  NUTRITION INTERVENTION  Nutrition education (E-1) on the following topics:  Exercise (water aerobics) Basics for diet that reduces inflammation Fiber Plant sources of omega 3  Handouts Provided Include  Simple diet tips to reduce inflammation  Learning Style & Readiness for Change Teaching method utilized: Visual & Auditory  Demonstrated degree of understanding via: Teach Back  Barriers to learning/adherence to lifestyle change: none  Goals Established by Pt Some ideas to help get some foods that can help reduce inflammation: Additions to your yogurt or oatmeal: chia seeds, ground flaxseed, walnuts  Spinach added to your other foods including smoothies Look for recipes with chia seeds.  Omega 3 supplement.  Continue getting good sleep.  [facility]: [email or web address]  MONITORING & EVALUATION Dietary intake, weekly physical activity, and PCOS sxs in 3 months.

## 2021-11-07 NOTE — Patient Instructions (Addendum)
Some ideas to help get some foods that can help reduce inflammation: Additions to your yogurt or oatmeal: chia seeds, ground flaxseed, walnuts  Spinach added to your other foods including smoothies Look for recipes with chia seeds.  Omega 3 supplement.  Continue getting good sleep.  [facility]: [email or web address]

## 2021-11-08 ENCOUNTER — Ambulatory Visit (INDEPENDENT_AMBULATORY_CARE_PROVIDER_SITE_OTHER): Payer: Commercial Managed Care - HMO | Admitting: Podiatry

## 2021-11-08 DIAGNOSIS — Z872 Personal history of diseases of the skin and subcutaneous tissue: Secondary | ICD-10-CM | POA: Diagnosis not present

## 2021-11-08 DIAGNOSIS — B351 Tinea unguium: Secondary | ICD-10-CM

## 2021-11-08 MED ORDER — ONDANSETRON 4 MG PO TBDP: 4.0000 mg | ORAL_TABLET | Freq: Three times a day (TID) | ORAL | 6 refills | Status: DC | PRN

## 2021-11-08 MED ORDER — ONDANSETRON 8 MG PO TBDP: 8.0000 mg | ORAL_TABLET | Freq: Three times a day (TID) | ORAL | 6 refills | Status: DC | PRN

## 2021-11-08 NOTE — Addendum Note (Signed)
Addended by: Genia Harold on: 11/08/2021 11:50 AM   Modules accepted: Orders

## 2021-11-08 NOTE — Telephone Encounter (Signed)

## 2021-11-08 NOTE — Progress Notes (Signed)
  Subjective:  Patient ID: Theresa Phillips, female    DOB: [birth year],  MRN: [id number]  Chief Complaint  Patient presents with   Toe Pain    Tenderness to bilateral hallux to entire nail. Started about a week ago. No injuries. Patient has had ingrown toenail procedure done about a year ago.     41 y.o. female presents  for nail check bilateral foot. She wants to ensure she does not have an ingrown on either great toe.  She does have some tenderness if there is direct pressure on the nail from the top down in the central.  She is not having pain in either border.  She has previously had ingrown nail procedures done in the past by Dr. March Rummage last year a year ago.  She denies any redness swelling or drainage.  She has not tried any Epsom salt soaks yet.  Objective:  Physical Exam: warm, good capillary refill, DP and PT pulses 2/4 bilateral nail exam normal nails without lesions and nail polish applied unable to determine if there is significant discoloration however there is not significant thickening of the either great toenail DP pulses palpable, PT pulses palpable, and protective sensation intact Left Foot:  Pain with direct palpation of the hallux nail from dorsal to plantar at the central area.  No pain on the medial lateral border Right Foot: Pain with direct palpation of the hallux nail from dorsal to plantar at the central area.  No pain on the medial lateral border  Assessment:   1. History of ingrown nail   2. Onychomycosis      Plan:  Patient was evaluated and treated and all questions answered.  #History of ingrown nail border medial and lateral on the bilateral hallux -I recommend that we continue to monitor for any future ingrown nails at this time.  She does not have any evidence of having an ingrown currently.  Though there is minor pain with direct pressure I recommend Epsom salt soaks for pain control. -Nails are not overly thickened.  I discussed that if she did have  continued pain or worsening pain we would have to consider total nail avulsion for fungal thickening of the nails. -Patient is agreeable to continue to monitor she does not want to have a total nail avulsion procedure at this time.  Return if symptoms worsen or fail to improve.         Everitt Amber, DPM [facility]

## 2021-11-10 ENCOUNTER — Other Ambulatory Visit: Payer: Self-pay | Admitting: Family Medicine

## 2021-11-10 NOTE — Telephone Encounter (Signed)
Medication Refill - Medication: olopatadine (PATANOL) 0.1 % ophthalmic solution  Has the patient contacted their pharmacy? Yes.   (Agent: If no, request that the patient contact the pharmacy for the refill. If patient does not wish to contact the pharmacy document the reason why and proceed with request.) (Agent: If yes, when and what did the pharmacy advise?)  Preferred Pharmacy (with phone number or street name):  [id number] - [place], [address]  [place] [address]  Phone: [phone] Fax: [phone]   Has the patient been seen for an appointment in the last year OR does the patient have an upcoming appointment? Yes.    Agent: Please be advised that RX refills may take up to 3 business days. We ask that you follow-up with your pharmacy.

## 2021-11-10 NOTE — Telephone Encounter (Signed)
Requested medication (s) are due for refill today -provider review   Requested medication (s) are on the active medication list -yes  Future visit scheduled -no  Last refill: 03/12/19  Notes to clinic: medication listed as historical- sent for review   Requested Prescriptions  Pending Prescriptions Disp Refills   olopatadine (PATANOL) 0.1 % ophthalmic solution 5 mL      Ophthalmology:  [place] Passed - 11/10/2021 11:17 AM      Passed - Valid encounter within last 12 months    Recent Outpatient Visits           4 weeks ago Chronic pain syndrome   Primary [facility] at [facility] [place], PA-C   2 months ago Chronic pain syndrome   Primary Care at [facility], MD   3 months ago Annual physical exam   Primary Care at [facility], MD   4 months ago Vaginitis and vulvovaginitis   Primary Care at [place], [place]                 Requested Prescriptions  Pending Prescriptions Disp Refills   olopatadine (PATANOL) 0.1 % ophthalmic solution 5 mL      Ophthalmology:  [facility] Passed - 11/10/2021 11:17 AM      Passed - Valid encounter within last 12 months    Recent Outpatient Visits           4 weeks ago Chronic pain syndrome   Primary Care at [facility], [place] [place], PA-C   2 months ago Chronic pain syndrome   [facility] at [facility], MD   3 months ago Annual physical exam   Primary Care at [facility], MD   4 months ago Vaginitis and vulvovaginitis   Primary Care at [facility], MD

## 2021-11-13 ENCOUNTER — Other Ambulatory Visit: Payer: Self-pay | Admitting: Family Medicine

## 2021-11-13 ENCOUNTER — Encounter: Payer: Self-pay | Admitting: Obstetrics and Gynecology

## 2021-11-13 ENCOUNTER — Encounter: Payer: Self-pay | Admitting: Family Medicine

## 2021-11-13 DIAGNOSIS — J309 Allergic rhinitis, unspecified: Secondary | ICD-10-CM

## 2021-11-14 ENCOUNTER — Other Ambulatory Visit: Payer: Self-pay | Admitting: Obstetrics and Gynecology

## 2021-11-14 ENCOUNTER — Encounter: Payer: Self-pay | Admitting: Psychiatry

## 2021-11-14 ENCOUNTER — Other Ambulatory Visit: Payer: Self-pay | Admitting: Family Medicine

## 2021-11-14 DIAGNOSIS — J309 Allergic rhinitis, unspecified: Secondary | ICD-10-CM

## 2021-11-14 MED ORDER — LEVOCETIRIZINE DIHYDROCHLORIDE 5 MG PO TABS: 5.0000 mg | ORAL_TABLET | Freq: Every day | ORAL | 1 refills | Status: DC

## 2021-11-14 MED ORDER — OLOPATADINE HCL 0.1 % OP SOLN: OPHTHALMIC | 3 refills | Status: DC

## 2021-11-14 MED ORDER — CLOBETASOL PROPIONATE 0.05 % EX OINT: TOPICAL_OINTMENT | CUTANEOUS | 5 refills | Status: AC

## 2021-11-15 ENCOUNTER — Other Ambulatory Visit: Payer: Self-pay | Admitting: Psychiatry

## 2021-11-15 DIAGNOSIS — Z79899 Other long term (current) drug therapy: Secondary | ICD-10-CM | POA: Diagnosis not present

## 2021-11-15 MED ORDER — RIZATRIPTAN BENZOATE 10 MG PO TBDP: 10.0000 mg | ORAL_TABLET | ORAL | 11 refills | Status: AC | PRN

## 2021-11-15 NOTE — Telephone Encounter (Signed)
I sent an rx for rizatriptan to her pharmacy. She can try this and see if it is any more effective than the zolmitriptan

## 2021-11-17 ENCOUNTER — Other Ambulatory Visit: Payer: Self-pay | Admitting: Obstetrics and Gynecology

## 2021-11-17 MED ORDER — CLOTRIMAZOLE-BETAMETHASONE 1-0.05 % EX CREA: TOPICAL_CREAM | Freq: Two times a day (BID) | CUTANEOUS | 3 refills | Status: DC

## 2021-11-30 ENCOUNTER — Encounter: Payer: Self-pay | Admitting: Psychiatry

## 2021-11-30 ENCOUNTER — Ambulatory Visit (INDEPENDENT_AMBULATORY_CARE_PROVIDER_SITE_OTHER): Payer: Commercial Managed Care - HMO | Admitting: Podiatry

## 2021-11-30 ENCOUNTER — Telehealth: Payer: Self-pay

## 2021-11-30 DIAGNOSIS — L600 Ingrowing nail: Secondary | ICD-10-CM

## 2021-11-30 MED ORDER — NEOMYCIN-POLYMYXIN-HC 3.5-10000-1 OT SUSP: OTIC | 0 refills | Status: DC

## 2021-11-30 NOTE — Telephone Encounter (Signed)
Noted  

## 2021-11-30 NOTE — Patient Instructions (Signed)

## 2021-11-30 NOTE — Telephone Encounter (Signed)
PA rq was denied, per your health plan's criteria, this drug is covered if you meet the following: (1) You have not had more than 15 headache days per month during the past 6 months. (2) You have tried one of the following: (A) Non-steroidal anti-inflammatory drug. (B) Non-opioid analgesic. (C) Acetaminophen. (D) Caffeinated analgesic combination. (3) You have tried one preferred triptan: rizatriptan.  Pt has Headache days per month: 30 Headache free days per month: 0 Would you like to try something else?

## 2021-11-30 NOTE — Telephone Encounter (Signed)
PA submitted via CMM via Key: [id number] information has been sent to [employer]. Awaiting determination

## 2021-11-30 NOTE — Telephone Encounter (Signed)
Unfortunately we probably won't be able to get Nurtec or Bernita Raisin approved unless she reports <15 headache days. I sent in an rx for rizatriptan a couple of weeks ago. She may have to use this for now until we can get her headache days lower

## 2021-12-01 NOTE — Progress Notes (Signed)
  Subjective:  Patient ID: Theresa Phillips, female    DOB: 1981/01/12,  MRN: [id number]  Chief Complaint  Patient presents with   Ingrown Toenail    bil great toenails ingrown/ req different provider and aware in [place] office    41 y.o. female presents with the above complaint. History confirmed with patient.  She had ingrown's removed a couple years ago but they have returned  Objective:  Physical Exam: warm, good capillary refill, no trophic changes or ulcerative lesions, normal DP and PT pulses, and normal sensory exam.  Bilateral medial border hallux ingrown border no apparent  Assessment:   1. Ingrown nail      Plan:  Patient was evaluated and treated and all questions answered.    Ingrown Nail, bilaterally -Patient elects to proceed with minor surgery to remove ingrown toenail today. Consent reviewed and signed by patient. -Ingrown nail excised. See procedure note. -Educated on post-procedure care including soaking. Written instructions provided and reviewed. -Rx for Cortisporin sent to pharmacy. -Advised on signs and symptoms of infection developing.  We discussed that the phenol likely will create some redness and edema and tenderness around the nailbed as long as it is localized this is to be expected.  Will return as needed if any infection signs develop  Procedure: Excision of Ingrown Toenail Location: Bilateral 1st toe medial nail borders. Anesthesia: Lidocaine 1% plain; 1.5 mL and Marcaine 0.5% plain; 1.5 mL, digital block. Skin Prep: Betadine. Dressing: Silvadene; telfa; dry, sterile, compression dressing. Technique: Following skin prep, the toe was exsanguinated and a tourniquet was secured at the base of the toe. The affected nail border was freed, split with a nail splitter, and excised. Chemical matrixectomy was then performed with phenol and irrigated out with alcohol. The tourniquet was then removed and sterile dressing applied. Disposition: Patient tolerated  procedure well.    Return if symptoms worsen or fail to improve.

## 2021-12-01 NOTE — Telephone Encounter (Signed)
New PA submitted to [facility] as rq by pt.  Key: [id number]  Your information has been submitted and will be reviewed by [employer]. An electronic determination will be received in CoverMyMeds within 72-120 hours. If [name] has not responded in 120 hours, contact [employer] at [phone].

## 2021-12-02 ENCOUNTER — Encounter: Payer: Self-pay | Admitting: Podiatry

## 2021-12-05 NOTE — Telephone Encounter (Signed)
Coverage Start Date:12/01/2021;Coverage End Date:12/01/2022; pt notified.

## 2021-12-06 ENCOUNTER — Ambulatory Visit (INDEPENDENT_AMBULATORY_CARE_PROVIDER_SITE_OTHER): Payer: Commercial Managed Care - HMO | Admitting: Psychiatry

## 2021-12-06 VITALS — BP 144/106 | HR 98

## 2021-12-06 DIAGNOSIS — G43119 Migraine with aura, intractable, without status migrainosus: Secondary | ICD-10-CM | POA: Diagnosis not present

## 2021-12-06 DIAGNOSIS — G43711 Chronic migraine without aura, intractable, with status migrainosus: Secondary | ICD-10-CM

## 2021-12-06 MED ORDER — ONABOTULINUMTOXINA 200 UNITS IJ SOLR
155.0000 [IU] | Freq: Once | INTRAMUSCULAR | Status: AC
  Administered 2021-12-06: 155 [IU] via INTRAMUSCULAR

## 2021-12-06 NOTE — Progress Notes (Signed)
Botox- 200 units x 2 vial Lot: [id number] Expiration: 02/2024 NDC: 2229-7989-21  Bacteriostatic 0.9% Sodium Chloride- 63mL total [id number] Expiration: 09/2022 NDC: 4481-8563-14  Dx: G43.119 SP

## 2021-12-06 NOTE — Progress Notes (Signed)
[facility]  BOTULINUM TOXIN INJECTION PROCEDURE NOTE  Patient: Telecia Larocque   MRN: [id number]  Indication: Chronic migraines   History: 41 year old female with a history of lumbar radiculopathy, prediabetes who follows in clinic for chronic migraines. She is overdue for her Botox and has had daily headaches in the past month.  Last injection:07/28/21   Technique: Informed consent was obtained and signed. 200 units onabotulinumtoxinA (Lot# [id number] ; Expiration: 02/2024) were reconstituted using normal saline, to a concentration of 5 units per 0.81ml. 155 units were injected using sterile technique across 31 sites as follows: corrugator 10, procerus 5 units, frontalis 20 units, temporalis 40 units, occipitalis 30 units, cervical paraspinal 20 units, trapezius 30 units. 45 units were wasted. Patient tolerated procedure without complication.  Prior Therapies                                 Rescue: Imitrex 100 mg PRN Zomig 5 mg PRN - lack of efficacy Maxalt 10 mg PRN Nurtec 75 mg PRN Zofran Compazine  Prevention: Botox Elavil 50 mg QHS - drowsiness at higher doses carbamazepine propranolol Cymbalta 60 mg daily Gabapentin 600/600/900  Emgality 120 mg monthly Vyepti - helped somewhat  Plan: -Prevention: Continue Botox, gabapentin 600/600/900, amitripytline 50 mg QHS -Rescue: Continue Nurtec 75 mg PRN, Maxalt 10 mg PRN  Victorino Dike Dayn Barich 12/06/21 1:26 PM

## 2021-12-07 ENCOUNTER — Encounter: Payer: Self-pay | Admitting: Psychiatry

## 2021-12-07 DIAGNOSIS — K5909 Other constipation: Secondary | ICD-10-CM | POA: Diagnosis not present

## 2021-12-07 DIAGNOSIS — Z1211 Encounter for screening for malignant neoplasm of colon: Secondary | ICD-10-CM | POA: Diagnosis not present

## 2021-12-07 DIAGNOSIS — K219 Gastro-esophageal reflux disease without esophagitis: Secondary | ICD-10-CM | POA: Diagnosis not present

## 2021-12-07 NOTE — Telephone Encounter (Signed)
Does she need to sign consent ?

## 2021-12-08 ENCOUNTER — Encounter: Payer: Self-pay | Admitting: Psychiatry

## 2021-12-09 ENCOUNTER — Other Ambulatory Visit: Payer: Self-pay | Admitting: Psychiatry

## 2021-12-09 DIAGNOSIS — G43711 Chronic migraine without aura, intractable, with status migrainosus: Secondary | ICD-10-CM

## 2021-12-09 DIAGNOSIS — M25511 Pain in right shoulder: Secondary | ICD-10-CM | POA: Diagnosis not present

## 2021-12-09 NOTE — Telephone Encounter (Signed)
Sounds like she would be better fit for a different office since she does not want Botox done here. I will send a neurology referral for her to establish care with a new office.

## 2021-12-12 ENCOUNTER — Telehealth: Payer: Self-pay | Admitting: Psychiatry

## 2021-12-12 DIAGNOSIS — M25511 Pain in right shoulder: Secondary | ICD-10-CM | POA: Diagnosis not present

## 2021-12-12 DIAGNOSIS — Z79899 Other long term (current) drug therapy: Secondary | ICD-10-CM | POA: Diagnosis not present

## 2021-12-12 NOTE — Telephone Encounter (Signed)
Referral for transfer of care for migraines faxed to [facility]. Phone: [phone],  Fax: [phone]

## 2021-12-13 DIAGNOSIS — M25511 Pain in right shoulder: Secondary | ICD-10-CM | POA: Diagnosis not present

## 2021-12-13 NOTE — Telephone Encounter (Signed)
Pt call for referral for transfer of care for migraines to [facility] health neuro dept. [place] phone # [phone]

## 2021-12-14 NOTE — Telephone Encounter (Signed)
Referral for transfer of care for migraines have been faxed to [facility],. Phone: [phone], Fax: [phone]

## 2021-12-15 ENCOUNTER — Encounter: Payer: Self-pay | Admitting: Family Medicine

## 2021-12-19 ENCOUNTER — Encounter: Payer: Self-pay | Admitting: Obstetrics and Gynecology

## 2021-12-20 ENCOUNTER — Encounter: Payer: Self-pay | Admitting: Family Medicine

## 2021-12-20 NOTE — Telephone Encounter (Signed)
Please advise patient.  

## 2021-12-22 DIAGNOSIS — G4733 Obstructive sleep apnea (adult) (pediatric): Secondary | ICD-10-CM | POA: Diagnosis not present

## 2021-12-26 ENCOUNTER — Ambulatory Visit (INDEPENDENT_AMBULATORY_CARE_PROVIDER_SITE_OTHER): Payer: Commercial Managed Care - HMO | Admitting: Podiatry

## 2021-12-26 VITALS — BP 125/75 | HR 91

## 2021-12-26 DIAGNOSIS — L600 Ingrowing nail: Secondary | ICD-10-CM

## 2021-12-26 NOTE — Patient Instructions (Signed)
Continue the soaks, switch to dilute white vinegar (1/4 cup of white vinegar and 1qt warm water) or soapy water with antibacterial soap (like Dial).  Leave open to air. Try to clean away some of the scab now with a soft toothbrush

## 2021-12-26 NOTE — Progress Notes (Signed)
  Subjective:  Patient ID: Theresa Phillips, female    DOB: [birth year],  MRN: [id number]  Chief Complaint  Patient presents with   Ingrown Toenail    EST - BIL INGROWN F/U / STILL EXPERIENCING PAIN EVEN WHEN SOAKING    41 y.o. female presents with the above complaint. History confirmed with patient.  She still having some soreness especially burning during the soaking Objective:  Physical Exam: warm, good capillary refill, no trophic changes or ulcerative lesions, normal DP and PT pulses, and normal sensory exam.  Maitri sites appear to be healing well she does have some tenderness here and some discoloration, no signs of infection noted   1. Ingrown nail      Plan:  Patient was evaluated and treated and all questions answered.    Ingrown Nail, bilaterally -Expect most of this is likely secondary sensitivity to the phenolic acid.  I recommended she continue the soaks and ointment for another 2 weeks.  Also advised during soaks to brush away some of the scab that is present, there is no signs of infection currently that indicated need for oral antibiotics.  Advised this may take another 3 to 5 weeks for this to fully resolve.  She will return as needed   Return if symptoms worsen or fail to improve.

## 2021-12-29 DIAGNOSIS — M25511 Pain in right shoulder: Secondary | ICD-10-CM | POA: Diagnosis not present

## 2022-01-02 DIAGNOSIS — M5413 Radiculopathy, cervicothoracic region: Secondary | ICD-10-CM | POA: Diagnosis not present

## 2022-01-02 DIAGNOSIS — M9901 Segmental and somatic dysfunction of cervical region: Secondary | ICD-10-CM | POA: Diagnosis not present

## 2022-01-02 DIAGNOSIS — M9903 Segmental and somatic dysfunction of lumbar region: Secondary | ICD-10-CM | POA: Diagnosis not present

## 2022-01-02 DIAGNOSIS — M9902 Segmental and somatic dysfunction of thoracic region: Secondary | ICD-10-CM | POA: Diagnosis not present

## 2022-01-03 MED ORDER — DOXYCYCLINE HYCLATE 100 MG PO TABS: 100.0000 mg | ORAL_TABLET | Freq: Two times a day (BID) | ORAL | 0 refills | Status: DC

## 2022-01-05 DIAGNOSIS — M25511 Pain in right shoulder: Secondary | ICD-10-CM | POA: Diagnosis not present

## 2022-01-09 DIAGNOSIS — Z79899 Other long term (current) drug therapy: Secondary | ICD-10-CM | POA: Diagnosis not present

## 2022-01-09 LAB — CBC AND DIFFERENTIAL
HCT: 37 (ref 36–46)
Hemoglobin: 11.9 — AB (ref 12.0–16.0)
WBC: 6.6

## 2022-01-09 LAB — CBC: RBC: 4.58 (ref 3.87–5.11)

## 2022-01-12 DIAGNOSIS — M25511 Pain in right shoulder: Secondary | ICD-10-CM | POA: Diagnosis not present

## 2022-01-18 ENCOUNTER — Other Ambulatory Visit: Payer: Self-pay | Admitting: Obstetrics & Gynecology

## 2022-01-18 DIAGNOSIS — B372 Candidiasis of skin and nail: Secondary | ICD-10-CM

## 2022-01-18 DIAGNOSIS — M25511 Pain in right shoulder: Secondary | ICD-10-CM | POA: Diagnosis not present

## 2022-01-24 ENCOUNTER — Ambulatory Visit: Payer: Self-pay

## 2022-01-24 ENCOUNTER — Other Ambulatory Visit: Payer: Self-pay

## 2022-01-24 DIAGNOSIS — M25561 Pain in right knee: Secondary | ICD-10-CM | POA: Diagnosis not present

## 2022-01-24 DIAGNOSIS — M25562 Pain in left knee: Secondary | ICD-10-CM | POA: Diagnosis not present

## 2022-01-24 DIAGNOSIS — M25511 Pain in right shoulder: Secondary | ICD-10-CM | POA: Diagnosis not present

## 2022-01-24 NOTE — Telephone Encounter (Signed)
  Chief Complaint: Medication refill of flonase Symptoms:  Frequency:  Pertinent Negatives: Patient denies  Disposition: [] ED /[] Urgent Care (no appt availability in office) / [] Appointment(In office/virtual)/ []  [place] [facility]/ [] Home Care/ [] Refused Recommended Disposition /[] [place] [facility]/ [x]  Follow-up with PCP Additional Notes: Pt was taking Flonase prescribed by previous provider. Pt would like new rx for Flonase.    Summary: Requesting new script for Flonase   The patient called in wanting to talk to her provider about a medication prescribed. She is requesting a new prescription for her fluticasone (FLONASE) 50 MCG/ACT nasal spray as her previous provider wrote the last prescription for this. Please assist patient further         Reason for Disposition  [1] Prescription refill request for NON-ESSENTIAL medicine (i.e., no harm to patient if med not taken) AND [2] triager unable to refill per department policy  Answer Assessment - Initial Assessment Questions 1. DRUG NAME: "What medicine do you need to have refilled?"     Flonase 2. REFILLS REMAINING: "How many refills are remaining?" (Note: The label on the medicine or pill bottle will show how many refills are remaining. If there are no refills remaining, then a renewal may be needed.)     none 3. EXPIRATION DATE: "What is the expiration date?" (Note: The label states when the prescription will expire, and thus can no longer be refilled.)      4. PRESCRIBING HCP: "Who prescribed it?" Reason: If prescribed by specialist, call should be referred to that group.     Previous provider 5. SYMPTOMS: "Do you have any symptoms?"      6. PREGNANCY: "Is there any chance that you are pregnant?" "When was your last menstrual period?"  Protocols used: Medication Refill and Renewal Call-A-AH

## 2022-01-26 DIAGNOSIS — M25562 Pain in left knee: Secondary | ICD-10-CM | POA: Diagnosis not present

## 2022-01-26 DIAGNOSIS — M25561 Pain in right knee: Secondary | ICD-10-CM | POA: Diagnosis not present

## 2022-01-26 DIAGNOSIS — M25511 Pain in right shoulder: Secondary | ICD-10-CM | POA: Diagnosis not present

## 2022-02-08 ENCOUNTER — Encounter: Payer: Commercial Managed Care - HMO | Attending: Obstetrics and Gynecology | Admitting: Registered"

## 2022-02-08 ENCOUNTER — Other Ambulatory Visit: Payer: Self-pay | Admitting: Family Medicine

## 2022-02-08 ENCOUNTER — Encounter: Payer: Self-pay | Admitting: Registered"

## 2022-02-08 VITALS — Wt 278.7 lb

## 2022-02-08 DIAGNOSIS — E282 Polycystic ovarian syndrome: Secondary | ICD-10-CM

## 2022-02-08 NOTE — Progress Notes (Signed)
Medical Nutrition Therapy  Appointment Start time:  2440  Appointment End time:  1235  Primary concerns today: Weight gain due to PCOS and other digestive issues Referral diagnosis: PCOS Preferred learning style: no preference indicated Learning readiness: ready, change in progress  NUTRITION ASSESSMENT  Anthropometrics  Wt Readings from Last 3 Encounters:  02/08/22 278 lb 11.2 oz (126.4 kg)  11/03/21 295 lb 12.8 oz (134.2 kg)  10/12/21 293 lb 6.4 oz (133.1 kg)   Pt weight loss 17 lb in 3 months  Pt states weight loss is a result of efforts she has been making to improve her diet.  Clinical Medical Hx: POCS, migraines, low normal Vitamin B12, low vit D Medications: (pt reported) Metformin 500 mg  Protonix, Pepcid & Carafate 3x/day 1 mg (has improved GERD sxs but not under control) Migraine: nurtec 75 mg, Zomig, botox, gabapentin - nerves pain and ear, gums Vitamin D 50,000 weekly (still taking to prevent going back down) ObGYN prescribed birth control with higher estrogen to help correct hormonal balance Labs: 5.9% (07/2021), TSH 07/28/21 4.9 (H); tested again 08/15/21 2.9 (WNL) Notable Signs/Symptoms: constant migraine and body pain, hirsutism, low energy, irregular menstrual cycle  Lifestyle & Dietary Hx Pt states has been okay since last visit, now being followed by neurologist at [facility] and has had some medication changes in the last month. Migraines are improved but still constant.   Pt reports almost out of Metformin and needs refill, she will contact new healthcare provider. Pt states she has a hard time remembering to take all the doses because of the many medications and timing they all have to be taken.  Diet:  Food Avoided: (some foods worsen migraine sxs, some allergy related) seafood, pork, mango, sharp cheese, almonds, tumeric, watermelon (seedless? Hasn't tried seeded) Doesn't like raw vegetables but trying to eat more salad (continues)  Foods Included: walnuts  & pecans, kiwi occasionally, tomatoes sometimes, tomato sauce okay. Potatoes, green beans, spinach, - tries to not eat bread. Whole grain pasta, oatmeal with cinnamon, ginger, butter, to sweeten may add a little sugar or honey or apple.  Pt states she has started making more smoothies with frozen fruits and apple juice or water as the liquid, tops off with cinnamon and ginger.  Pt reports sxs that may indicate low blood sugar. Pt states she has a glucometer, but not strips.  Estimated daily fluid intake: not assessed oz Supplements: not assessed Sleep: not assessed Stress / self-care: not assessed Current average weekly physical activity: ADL, limited by hip injury  24-Hr Dietary Recall  not a complete recall  First Meal: old fashioned oatmeal or grits 2-3x/week, pecans, cinnamon ginger OR scrambled egg, shredded cheese, chicken sausage patty, sometimes with biscuit OR rice crispies Snack: Second Meal:  Snack:  Third Meal:  Snack:  Beverages: apple juice, 100% fruit juice, lemonade 1-2x/week  NUTRITION DIAGNOSIS  NB-1.1 Food and nutrition-related knowledge deficit As related to insulin resistance, carbs, and PCOS.  As evidenced by elevated A1c (5.9%), stopped Metformin due to believe only for diabetes.  NUTRITION INTERVENTION  Nutrition education (E-1) on the following topics:  Protein for breakfast to help regulate insulin Whole grains to help with lowering inflammation Continuous glucose monitoring Hypoglycemia  Handouts Provided Include  none  Learning Style & Readiness for Change Teaching method utilized: Visual & Auditory  Demonstrated degree of understanding via: Teach Back  Barriers to learning/adherence to lifestyle change: none  Goals Established by Pt Consider downloading the Dexcom G7 app on  your tablet and we can try a sample monitor next visit.  Consider checking your blood sugar when you have low blood sugar symptoms. 70 or lower is considered low blood sugar.  If 70-80 consider eating a meal or snack with carb and protein. If 70 or lower have 15 grams of fasting acting carbohydrates such as 1/2 cup juice or something sweet.  Aim to get protein for breakfast. Add protein to your smoothies  Continue keeping up the good work drinking water.  Great changes you have made that have resulted in weight loss, Less lemonade, pasta, bread, less fried foods.  MONITORING & EVALUATION Dietary intake, weekly physical activity, and PCOS sxs in 2 months.

## 2022-02-08 NOTE — Telephone Encounter (Signed)
Medication Refill - Medication:  metFORMIN (GLUCOPHAGE-XR) 500 MG 24 hr tablet [[id number]] From previous provider   Has the patient contacted their pharmacy? No. (Agent: If no, request that the patient contact the pharmacy for the refill. If patient does not wish to contact the pharmacy document the reason why and proceed with request.) (Agent: If yes, when and what did the pharmacy advise?)  Preferred Pharmacy (with phone number or street name):  [id number] - [place], [place]     Has the patient been seen for an appointment in the last year OR does the patient have an upcoming appointment? Yes.    Agent: Please be advised that RX refills may take up to 3 business days. We ask that you follow-up with your pharmacy.

## 2022-02-08 NOTE — Telephone Encounter (Signed)
Requested medication (s) are due for refill today: historical medication  Requested medication (s) are on the active medication list: yes  Last refill:  02/21/21  Future visit scheduled: no  Notes to clinic:  historical medication. Do you want to order medication?     Requested Prescriptions  Pending Prescriptions Disp Refills   metFORMIN (GLUCOPHAGE-XR) 500 MG 24 hr tablet      Sig: Take 1 tablet (500 mg total) by mouth daily.     Endocrinology:  Diabetes - Biguanides Failed - 02/08/2022  2:08 PM      Failed - HBA1C is between 0 and 7.9 and within 180 days    Hgb A1c MFr Bld  Date Value Ref Range Status  07/28/2021 5.9 (H) 4.8 - 5.6 % Final    Comment:             Prediabetes: 5.7 - 6.4          Diabetes: >6.4          Glycemic control for adults with diabetes: <7.0          Passed - Cr in normal range and within 360 days    Creatinine, Ser  Date Value Ref Range Status  07/28/2021 0.75 0.57 - 1.00 mg/dL Final         Passed - eGFR in normal range and within 360 days    GFR calc Af Amer  Date Value Ref Range Status  07/26/2008  >60 mL/min Final   >60        The eGFR has been calculated using the MDRD equation. This calculation has not been validated in all clinical situations. eGFR's persistently <60 mL/min signify possible Chronic Kidney Disease.   GFR calc non Af Amer  Date Value Ref Range Status  07/26/2008 >60 >60 mL/min Final   eGFR  Date Value Ref Range Status  07/28/2021 102 >59 mL/min/1.73 Final         Passed - B12 Level in normal range and within 720 days    Vitamin B-12  Date Value Ref Range Status  07/28/2021 368 232 - 1,245 pg/mL Final         Passed - Valid encounter within last 6 months    Recent Outpatient Visits           3 months ago Chronic pain syndrome   Primary Care at [facility], [place], [place]   5 months ago Chronic pain syndrome   Primary Care at [facility], [place]   6 months ago Annual physical  exam   Primary Care at [facility], [place], [place]   7 months ago Vaginitis and vulvovaginitis   Primary Care at [facility], [place], [place]              Passed - CBC within normal limits and completed in the last 12 months    WBC  Date Value Ref Range Status  07/28/2021 6.3 3.4 - 10.8 x10E3/uL Final  07/26/2008 7.2 4.0 - 10.5 K/uL Final   RBC  Date Value Ref Range Status  07/28/2021 4.57 3.77 - 5.28 x10E6/uL Final  07/26/2008 4.44 3.87 - 5.11 MIL/uL Final   Hemoglobin  Date Value Ref Range Status  07/28/2021 12.3 11.1 - 15.9 g/dL Final   Hematocrit  Date Value Ref Range Status  07/28/2021 37.0 34.0 - 46.6 % Final   MCHC  Date Value Ref Range Status  07/28/2021 33.2 31.5 - 35.7 g/dL Final  07/26/2008 34.8  30.0 - 36.0 g/dL Final   [facility]  Date Value Ref Range Status  07/28/2021 26.9 26.6 - 33.0 pg Final   MCV  Date Value Ref Range Status  07/28/2021 81 79 - 97 fL Final   No results found for: "PLTCOUNTKUC", "LABPLAT", "POCPLA" RDW  Date Value Ref Range Status  07/28/2021 14.5 11.7 - 15.4 % Final

## 2022-02-08 NOTE — Patient Instructions (Addendum)
Consider downloading the Dexcom G7 app on your tablet and we can try a sample monitor next visit.  Consider checking your blood sugar when you have low blood sugar symptoms. 70 or lower is considered low blood sugar. If 70-80 consider eating a meal or snack with carb and protein. If 70 or lower have 15 grams of fasting acting carbohydrates such as 1/2 cup juice or something sweet.  Aim to get protein for breakfast. Add protein to your smoothies  Continue keeping up the good work drinking water.  Great changes you have made that have resulted in weight loss, Less lemonade, pasta, bread, less fried foods.

## 2022-02-13 ENCOUNTER — Ambulatory Visit: Payer: Medicaid Other | Admitting: Registered"

## 2022-02-13 DIAGNOSIS — M25511 Pain in right shoulder: Secondary | ICD-10-CM | POA: Diagnosis not present

## 2022-02-13 DIAGNOSIS — M25561 Pain in right knee: Secondary | ICD-10-CM | POA: Diagnosis not present

## 2022-02-13 DIAGNOSIS — Z79899 Other long term (current) drug therapy: Secondary | ICD-10-CM | POA: Diagnosis not present

## 2022-02-13 DIAGNOSIS — M542 Cervicalgia: Secondary | ICD-10-CM | POA: Diagnosis not present

## 2022-02-13 DIAGNOSIS — G8929 Other chronic pain: Secondary | ICD-10-CM | POA: Diagnosis not present

## 2022-02-13 DIAGNOSIS — M25551 Pain in right hip: Secondary | ICD-10-CM | POA: Diagnosis not present

## 2022-02-13 DIAGNOSIS — Z6841 Body Mass Index (BMI) 40.0 and over, adult: Secondary | ICD-10-CM | POA: Diagnosis not present

## 2022-02-13 DIAGNOSIS — M25562 Pain in left knee: Secondary | ICD-10-CM | POA: Diagnosis not present

## 2022-02-17 MED ORDER — METFORMIN HCL ER 500 MG PO TB24: 500.0000 mg | ORAL_TABLET | Freq: Every day | ORAL | 1 refills | Status: DC

## 2022-02-20 ENCOUNTER — Other Ambulatory Visit: Payer: Self-pay | Admitting: Family Medicine

## 2022-02-20 NOTE — Telephone Encounter (Signed)
Medication Refill - Medication: fluticasone (FLONASE) 50 MCG/ACT nasal spray   Has the patient contacted their pharmacy? Yes.     Preferred Pharmacy (with phone number or street name):  [facility] [place], [place] Phone: [phone]  Fax: [phone]     Has the patient been seen for an appointment in the last year OR does the patient have an upcoming appointment? Yes.    Please assist patient further as she scheduled the soonest available follow up appt she could and is on the wait list.

## 2022-02-21 NOTE — Telephone Encounter (Signed)
Requested medications are due for refill today.  unsure  Requested medications are on the active medications list.  yes  Last refill. 03/06/2018  Future visit scheduled.   yes  Notes to clinic.  Historical medication/provider.    Requested Prescriptions  Pending Prescriptions Disp Refills   fluticasone (FLONASE) 50 MCG/ACT nasal spray      Sig: Place 2 sprays into both nostrils daily.     Ear, Nose, and Throat: Nasal Preparations - Corticosteroids Passed - 02/20/2022 10:37 AM      Passed - Valid encounter within last 12 months    Recent Outpatient Visits           4 months ago Chronic pain syndrome   [facility] Care at [facility], Dionne Bucy, [place]   5 months ago Chronic pain syndrome   [place] [facility] at [facility], [place]   6 months ago Annual physical exam   [place] [facility] at [facility], [place]   8 months ago Vaginitis and vulvovaginitis   [place] Primary Care at [facility], [place]       Future Appointments             In 2 weeks Dorna Mai, MD [facility] at [facility]

## 2022-02-23 DIAGNOSIS — H43393 Other vitreous opacities, bilateral: Secondary | ICD-10-CM | POA: Diagnosis not present

## 2022-02-23 DIAGNOSIS — H52203 Unspecified astigmatism, bilateral: Secondary | ICD-10-CM | POA: Diagnosis not present

## 2022-02-23 DIAGNOSIS — H5213 Myopia, bilateral: Secondary | ICD-10-CM | POA: Diagnosis not present

## 2022-02-23 DIAGNOSIS — H524 Presbyopia: Secondary | ICD-10-CM | POA: Diagnosis not present

## 2022-03-02 ENCOUNTER — Ambulatory Visit: Payer: Commercial Managed Care - HMO | Admitting: Psychiatry

## 2022-03-03 DIAGNOSIS — G43E19 Chronic migraine with aura, intractable, without status migrainosus: Secondary | ICD-10-CM | POA: Diagnosis not present

## 2022-03-06 DIAGNOSIS — G43709 Chronic migraine without aura, not intractable, without status migrainosus: Secondary | ICD-10-CM | POA: Diagnosis not present

## 2022-03-09 ENCOUNTER — Ambulatory Visit (INDEPENDENT_AMBULATORY_CARE_PROVIDER_SITE_OTHER): Payer: Medicaid Other | Admitting: Family Medicine

## 2022-03-09 ENCOUNTER — Encounter: Payer: Self-pay | Admitting: Family Medicine

## 2022-03-09 ENCOUNTER — Encounter: Payer: Medicaid Other | Admitting: Family Medicine

## 2022-03-09 VITALS — BP 129/90 | HR 78 | Temp 98.1°F | Resp 16 | Wt 276.4 lb

## 2022-03-09 DIAGNOSIS — J309 Allergic rhinitis, unspecified: Secondary | ICD-10-CM

## 2022-03-09 DIAGNOSIS — Z1231 Encounter for screening mammogram for malignant neoplasm of breast: Secondary | ICD-10-CM

## 2022-03-09 DIAGNOSIS — D509 Iron deficiency anemia, unspecified: Secondary | ICD-10-CM

## 2022-03-09 MED ORDER — IRON (FERROUS SULFATE) 325 (65 FE) MG PO TABS: 325.0000 mg | ORAL_TABLET | Freq: Every day | ORAL | 1 refills | Status: AC

## 2022-03-09 MED ORDER — LORATADINE 10 MG PO TABS: 10.0000 mg | ORAL_TABLET | Freq: Every day | ORAL | 1 refills | Status: DC

## 2022-03-09 MED ORDER — FLUTICASONE PROPIONATE 93 MCG/ACT NA EXHU: 1.0000 | INHALANT_SUSPENSION | Freq: Two times a day (BID) | NASAL | 5 refills | Status: DC

## 2022-03-09 NOTE — Progress Notes (Signed)
Established Patient Office Visit  Subjective    Patient ID: Theresa Phillips, female    DOB: 08-24-1980  Age: 42 y.o. MRN: [id number]  CC:  Chief Complaint  Patient presents with   Follow-up    HPI Theresa Phillips presents for routine follow up of chronic med issues.    Outpatient Encounter Medications as of 03/09/2022  Medication Sig   clindamycin (CLINDAGEL) 1 % gel as needed.   hydrOXYzine (VISTARIL) 25 MG capsule Take by mouth.   lubiprostone (AMITIZA) 24 MCG capsule Take by mouth.   rizatriptan (MAXALT-MLT) 10 MG disintegrating tablet Take 1 tablet (10 mg total) by mouth as needed for migraine. May repeat in 2 hours if needed. Max dose 2 pills in 24 hours   betamethasone acetate-betamethasone sodium phosphate (CELESTONE) 6 (3-3) MG/ML injection Inject into the muscle.   botulinum toxin Type A (BOTOX) 200 units injection Inject 155 units into the head and neck muscles every 90 days.   clobetasol ointment (TEMOVATE) 0.05 % Apply to affected area every night for 4 weeks, then every other day for 4 weeks and then twice a week for 4 weeks or until resolution.   clotrimazole-betamethasone (LOTRISONE) cream Apply topically 2 (two) times daily.   emollient (BIAFINE) cream Apply topically.   ergocalciferol (VITAMIN D2) 1.25 MG (50000 UT) capsule Take 1 capsule by mouth once a week.   famotidine (PEPCID) 20 MG tablet Take 20 mg by mouth 2 (two) times daily as needed.   fluticasone (FLONASE) 50 MCG/ACT nasal spray Place 2 sprays into both nostrils daily.   gabapentin (NEURONTIN) 600 MG tablet Take 1 tablet (600 mg total) by mouth 3 (three) times daily.   Galcanezumab-gnlm (EMGALITY) 120 MG/ML SOSY INJECT 2 PENS (240MG) UNDER SKIN FOR ONE DOSE THEN INJECT 1 PEN SUBCUTANEOUSLY MONTHLY THEREAFTER   HYDROcodone Bitartrate ER (HYSINGLA ER) 20 MG T24A TAKE 1 TABLET TWICE DAILY FOR 7 DAYS   hydrOXYzine (ATARAX) 25 MG tablet Take 25 mg by mouth 2 (two) times daily.   ibuprofen (ADVIL) 600 MG tablet  Take 1 tablet (600 mg total) by mouth every 8 (eight) hours as needed.   levocetirizine (XYZAL) 5 MG tablet Take 1 tablet (5 mg total) by mouth daily.   loratadine (CLARITIN) 10 MG tablet Take 1 tablet by mouth daily.   metFORMIN (GLUCOPHAGE-XR) 500 MG 24 hr tablet Take 1 tablet (500 mg total) by mouth daily.   neomycin-polymyxin-hydrocortisone (CORTISPORIN) 3.5-10000-1 OTIC suspension Apply 1-2 drops daily after soaking and cover with bandaid   norgestimate-ethinyl estradiol (ORTHO-CYCLEN) 0.25-35 MG-MCG tablet Take 1 tablet by mouth daily.   nystatin-triamcinolone ointment (MYCOLOG) Apply 1 application  topically 2 (two) times daily.   olopatadine (PATANOL) 0.1 % ophthalmic solution Place one drop into both eyes 2 (two) times daily.   ondansetron (ZOFRAN-ODT) 8 MG disintegrating tablet Take 1 tablet (8 mg total) by mouth every 8 (eight) hours as needed for nausea or vomiting.   pantoprazole (PROTONIX) 40 MG tablet Take by mouth.   predniSONE (DELTASONE) 10 MG tablet Take by mouth.   prochlorperazine (COMPAZINE) 10 MG tablet TAKE 1 TABLET BY MOUTH EVERY DAY AS NEEDED FOR HEADACHE OR NAUSEA (Patient not taking: Reported on 02/08/2022)   Rimegepant Sulfate 75 MG TBDP Take 75 mg by mouth as needed (migraine). Max dose 1 pill in 24 hours   SUMAtriptan (IMITREX) 100 MG tablet Take 100 mg by mouth as directed. (Patient not taking: Reported on 02/08/2022)   traMADol (ULTRAM) 50 MG tablet Take 50 mg  by mouth 2 (two) times daily as needed.   TRULANCE 3 MG TABS Take by mouth.   [DISCONTINUED] amitriptyline (ELAVIL) 50 MG tablet Take 50 mg by mouth daily. (Patient not taking: Reported on 02/08/2022)   [DISCONTINUED] cyclobenzaprine (FLEXERIL) 5 MG tablet Take 1 tablet (5 mg total) by mouth 3 (three) times daily as needed for muscle spasms.   [DISCONTINUED] dicyclomine (BENTYL) 20 MG tablet Take 20 mg by mouth 2 (two) times daily as needed.   [DISCONTINUED] doxycycline (VIBRA-TABS) 100 MG tablet Take 1 tablet  (100 mg total) by mouth 2 (two) times daily.   [DISCONTINUED] DULoxetine (CYMBALTA) 60 MG capsule Take 1 capsule (60 mg total) by mouth daily.   [DISCONTINUED] fluconazole (DIFLUCAN) 150 MG tablet TAKE 1 TABLET (150 MG TOTAL) BY MOUTH ONCE FOR 1 DOSE. MAY REPEAT 3 DAYS LATER IF SYMPTOMS PERSIST   [DISCONTINUED] gabapentin (NEURONTIN) 300 MG capsule Take 1 capsule (300 mg total) by mouth at bedtime. Take with 600 mg pill for a total of 900 mg at bedtime.   [DISCONTINUED] ondansetron (ZOFRAN-ODT) 4 MG disintegrating tablet Take 1 tablet (4 mg total) by mouth every 8 (eight) hours as needed for nausea or vomiting.   [DISCONTINUED] sucralfate (CARAFATE) 1 g tablet Take 1 g by mouth 2 (two) times daily.   No facility-administered encounter medications on file as of 03/09/2022.    Past Medical History:  Diagnosis Date   Allergy    See list   Anxiety 05/2014   Chronic pain syndrome    Clotting disorder (HCC)    Jan/Feb 23   GERD (gastroesophageal reflux disease)    Migraines    Neuromuscular disorder (HCC)    PCOS (polycystic ovarian syndrome)    PTSD (post-traumatic stress disorder)    Seizures (HCC)    Sinusitis    Sleep apnea    7 years ago, CPAP   Vitamin D deficiency     Past Surgical History:  Procedure Laterality Date   EXPLORATORY LAPAROTOMY     WISDOM TOOTH EXTRACTION      Family History  Problem Relation Age of Onset   Early death Mother    Bipolar disorder Father    Diabetes Maternal Grandfather    Diabetes Maternal Aunt    Breast cancer Paternal Aunt     Social History   Socioeconomic History   Marital status: Single    Spouse name: Not on file   Number of children: 0   Years of education: Not on file   Highest education level: Not on file  Occupational History    Comment: Personnel officer    Comment: Control and instrumentation engineer  Tobacco Use   Smoking status: Never   Smokeless tobacco: Never  Vaping Use   Vaping Use: Never used  Substance and Sexual Activity    Alcohol use: Yes    Comment: I drink socially every blue moon.   Drug use: Never   Sexual activity: Not Currently    Birth control/protection: Abstinence, Condom, Pill  Other Topics Concern   Not on file  Social History Narrative   Lives with room mate   Caffeine none.   Social Determinants of Health   Financial Resource Strain: Not on file  Food Insecurity: No Food Insecurity (09/21/2021)   Hunger Vital Sign    Worried About Running Out of Food in the Last Year: Never true    Ran Out of Food in the Last Year: Never true  Transportation Needs: Not on file  Physical Activity: Not  on file  Stress: Not on file  Social Connections: Not on file  Intimate Partner Violence: Not on file    Review of Systems  All other systems reviewed and are negative.       Objective    BP (!) 129/90   Pulse 78   Temp 98.1 F (36.7 C) (Oral)   Resp 16   Wt 276 lb 6.4 oz (125.4 kg)   SpO2 96%   BMI 46.00 kg/m   Physical Exam Vitals and nursing note reviewed.  Constitutional:      General: She is not in acute distress. Cardiovascular:     Rate and Rhythm: Normal rate and regular rhythm.  Pulmonary:     Effort: Pulmonary effort is normal.     Breath sounds: Normal breath sounds.  Abdominal:     Palpations: Abdomen is soft.     Tenderness: There is no abdominal tenderness.  Neurological:     General: No focal deficit present.     Mental Status: She is alert and oriented to person, place, and time.  Psychiatric:        Mood and Affect: Affect normal. Mood is anxious.        Behavior: Behavior normal.         Assessment & Plan:   1. Allergic rhinitis, unspecified seasonality, unspecified trigger Flonase and claritin prescribed  2. Iron deficiency anemia, unspecified iron deficiency anemia type Daily iron supplement prescribed.   3. Encounter for screening mammogram for malignant neoplasm of breast  - MM Digital Screening; Future   No follow-ups on file.    Becky Sax, MD

## 2022-03-09 NOTE — Telephone Encounter (Signed)
Paper has been printed

## 2022-03-10 ENCOUNTER — Other Ambulatory Visit: Payer: Self-pay | Admitting: Family Medicine

## 2022-03-11 ENCOUNTER — Other Ambulatory Visit: Payer: Self-pay | Admitting: Family Medicine

## 2022-03-14 ENCOUNTER — Encounter: Payer: Self-pay | Admitting: Family Medicine

## 2022-03-14 ENCOUNTER — Telehealth: Payer: Self-pay | Admitting: Family Medicine

## 2022-03-14 DIAGNOSIS — M25561 Pain in right knee: Secondary | ICD-10-CM | POA: Diagnosis not present

## 2022-03-14 DIAGNOSIS — M25562 Pain in left knee: Secondary | ICD-10-CM | POA: Diagnosis not present

## 2022-03-14 DIAGNOSIS — R635 Abnormal weight gain: Secondary | ICD-10-CM | POA: Diagnosis not present

## 2022-03-14 DIAGNOSIS — Z79899 Other long term (current) drug therapy: Secondary | ICD-10-CM | POA: Diagnosis not present

## 2022-03-14 DIAGNOSIS — G8929 Other chronic pain: Secondary | ICD-10-CM | POA: Diagnosis not present

## 2022-03-14 DIAGNOSIS — M542 Cervicalgia: Secondary | ICD-10-CM | POA: Diagnosis not present

## 2022-03-14 DIAGNOSIS — Z6841 Body Mass Index (BMI) 40.0 and over, adult: Secondary | ICD-10-CM | POA: Diagnosis not present

## 2022-03-14 DIAGNOSIS — M25511 Pain in right shoulder: Secondary | ICD-10-CM | POA: Diagnosis not present

## 2022-03-14 DIAGNOSIS — M25551 Pain in right hip: Secondary | ICD-10-CM | POA: Diagnosis not present

## 2022-03-14 NOTE — Telephone Encounter (Signed)
Theresa Phillips with [facility] is calling alongside patient asking if the office received a prior authorization from the pharmacy today on 03/14/22. Please follow up with patient.

## 2022-03-16 DIAGNOSIS — Z79899 Other long term (current) drug therapy: Secondary | ICD-10-CM | POA: Diagnosis not present

## 2022-03-21 ENCOUNTER — Encounter: Payer: Self-pay | Admitting: *Deleted

## 2022-03-21 ENCOUNTER — Telehealth: Payer: Self-pay | Admitting: *Deleted

## 2022-03-21 NOTE — Telephone Encounter (Signed)
error 

## 2022-03-23 ENCOUNTER — Other Ambulatory Visit: Payer: Self-pay | Admitting: Family Medicine

## 2022-03-27 ENCOUNTER — Encounter: Payer: Self-pay | Admitting: Family Medicine

## 2022-03-27 NOTE — Telephone Encounter (Signed)
Please advise patient.  

## 2022-04-03 ENCOUNTER — Ambulatory Visit: Payer: Medicaid Other | Admitting: Registered"

## 2022-04-04 ENCOUNTER — Ambulatory Visit (INDEPENDENT_AMBULATORY_CARE_PROVIDER_SITE_OTHER): Payer: Medicaid Other | Admitting: Family Medicine

## 2022-04-04 ENCOUNTER — Encounter: Payer: Self-pay | Admitting: Family Medicine

## 2022-04-04 VITALS — BP 134/89 | HR 100 | Temp 97.0°F | Resp 16 | Wt 276.0 lb

## 2022-04-04 DIAGNOSIS — M25579 Pain in unspecified ankle and joints of unspecified foot: Secondary | ICD-10-CM | POA: Diagnosis not present

## 2022-04-04 MED ORDER — IBUPROFEN 800 MG PO TABS: 800.0000 mg | ORAL_TABLET | Freq: Three times a day (TID) | ORAL | 0 refills | Status: DC | PRN

## 2022-04-04 NOTE — Telephone Encounter (Signed)
Patient given an  appt for same  day

## 2022-04-04 NOTE — Progress Notes (Signed)
Patient said that she rolled her ankle. Patient said she iced but still in pain

## 2022-04-04 NOTE — Telephone Encounter (Signed)
Patient offered appointment for 3 pm today. Patient will call back

## 2022-04-10 ENCOUNTER — Encounter: Payer: Self-pay | Admitting: Family Medicine

## 2022-04-10 NOTE — Progress Notes (Signed)
Established Patient Office Visit  Subjective    Patient ID: Theresa Phillips, female    DOB: [birth year]  Age: 42 y.o. MRN: [id number]  CC:  Chief Complaint  Patient presents with   Ankle Pain    HPI Theresa Phillips presents for complaint of  ankle pain after rolling her ankle while walking. Sx have been for several days.    Outpatient Encounter Medications as of 04/04/2022  Medication Sig   AJOVY 225 MG/1.5ML SOAJ Inject into the skin.   amitriptyline (ELAVIL) 25 MG tablet    betamethasone acetate-betamethasone sodium phosphate (CELESTONE) 6 (3-3) MG/ML injection Inject into the muscle.   botulinum toxin Type A (BOTOX) 200 units injection Inject 155 units into the head and neck muscles every 90 days.   clindamycin (CLINDAGEL) 1 % gel as needed.   clobetasol ointment (TEMOVATE) 0.05 % Apply to affected area every night for 4 weeks, then every other day for 4 weeks and then twice a week for 4 weeks or until resolution.   clotrimazole-betamethasone (LOTRISONE) cream Apply topically 2 (two) times daily.   emollient (BIAFINE) cream Apply topically.   ergocalciferol (VITAMIN D2) 1.25 MG (50000 UT) capsule Take 1 capsule by mouth once a week.   famotidine (PEPCID) 20 MG tablet Take 20 mg by mouth 2 (two) times daily as needed.   Fluticasone Propionate 93 MCG/ACT EXHU Place 1 spray into the nose in the morning and at bedtime.   Fremanezumab-vfrm 225 MG/1.5ML SOSY Inject into the skin.   gabapentin (NEURONTIN) 600 MG tablet Take 1 tablet (600 mg total) by mouth 3 (three) times daily.   Galcanezumab-gnlm (EMGALITY) 120 MG/ML SOSY INJECT 2 PENS (240MG ) UNDER SKIN FOR ONE DOSE THEN INJECT 1 PEN SUBCUTANEOUSLY MONTHLY THEREAFTER   HYDROcodone Bitartrate ER (HYSINGLA ER) 20 MG T24A TAKE 1 TABLET TWICE DAILY FOR 7 DAYS   HYDROcodone-acetaminophen (NORCO/VICODIN) 5-325 MG tablet Take by mouth.   hydrOXYzine (ATARAX) 25 MG tablet Take 25 mg by mouth 2 (two) times daily.   hydrOXYzine (VISTARIL) 25 MG  capsule Take by mouth.   ibuprofen (ADVIL) 600 MG tablet Take 1 tablet (600 mg total) by mouth every 8 (eight) hours as needed.   ibuprofen (ADVIL) 800 MG tablet Take 1 tablet (800 mg total) by mouth every 8 (eight) hours as needed.   Iron, Ferrous Sulfate, 325 (65 Fe) MG TABS Take 325 mg by mouth daily.   levocetirizine (XYZAL) 5 MG tablet Take 1 tablet (5 mg total) by mouth daily.   loratadine (CLARITIN) 10 MG tablet Take 1 tablet (10 mg total) by mouth daily.   lubiprostone (AMITIZA) 24 MCG capsule Take by mouth.   metFORMIN (GLUCOPHAGE-XR) 500 MG 24 hr tablet TAKE 1 TABLET (500 MG TOTAL) BY MOUTH DAILY.   neomycin-polymyxin-hydrocortisone (CORTISPORIN) 3.5-10000-1 OTIC suspension Apply 1-2 drops daily after soaking and cover with bandaid   norgestimate-ethinyl estradiol (ORTHO-CYCLEN) 0.25-35 MG-MCG tablet Take 1 tablet by mouth daily.   nystatin-triamcinolone ointment (MYCOLOG) Apply 1 application  topically 2 (two) times daily.   olopatadine (PATANOL) 0.1 % ophthalmic solution PLACE ONE DROP INTO BOTH EYES 2 TIMES DAILY.   ondansetron (ZOFRAN) 8 MG tablet Take by mouth.   ondansetron (ZOFRAN-ODT) 8 MG disintegrating tablet Take 1 tablet (8 mg total) by mouth every 8 (eight) hours as needed for nausea or vomiting.   pantoprazole (PROTONIX) 40 MG tablet Take by mouth.   phentermine 15 MG capsule Take 15 mg by mouth every morning.   predniSONE (DELTASONE) 10 MG tablet  Take by mouth.   prochlorperazine (COMPAZINE) 10 MG tablet    Rimegepant Sulfate 75 MG TBDP Take 75 mg by mouth as needed (migraine). Max dose 1 pill in 24 hours   rizatriptan (MAXALT-MLT) 10 MG disintegrating tablet Take 1 tablet (10 mg total) by mouth as needed for migraine. May repeat in 2 hours if needed. Max dose 2 pills in 24 hours   sucralfate (CARAFATE) 1 g tablet Take by mouth.   SUMAtriptan (IMITREX) 100 MG tablet Take 100 mg by mouth as directed.   tiZANidine (ZANAFLEX) 4 MG tablet    traMADol (ULTRAM) 50 MG tablet  Take 50 mg by mouth 2 (two) times daily as needed.   TRULANCE 3 MG TABS Take by mouth.   No facility-administered encounter medications on file as of 04/04/2022.    Past Medical History:  Diagnosis Date   Allergy    See list   Anxiety 05/2014   Chronic pain syndrome    Clotting disorder (HCC)    Jan/Feb 23   GERD (gastroesophageal reflux disease)    Migraines    Neuromuscular disorder (HCC)    PCOS (polycystic ovarian syndrome)    PTSD (post-traumatic stress disorder)    Seizures (HCC)    Sinusitis    Sleep apnea    7 years ago, CPAP   Vitamin D deficiency     Past Surgical History:  Procedure Laterality Date   EXPLORATORY LAPAROTOMY     WISDOM TOOTH EXTRACTION      Family History  Problem Relation Age of Onset   Early death Mother    Bipolar disorder Father    Diabetes Maternal Grandfather    Diabetes Maternal Aunt    Breast cancer Paternal Aunt     Social History   Socioeconomic History   Marital status: Single    Spouse name: Not on file   Number of children: 0   Years of education: Not on file   Highest education level: Not on file  Occupational History    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   Drug use: Never   Sexual activity: Not Currently    Birth control/protection: Abstinence, Condom, Pill  Other Topics Concern   Not on file  Social History Narrative   Lives with room mate   Caffeine none.   Social Determinants of Health   Financial Resource Strain: Not on file  Food Insecurity: No Food Insecurity (09/21/2021)   Hunger Vital Sign    Worried About Running Out of Food in the Last Year: Never true    Ran Out of Food in the Last Year: Never true  Transportation Needs: Not on file  Physical Activity: Not on file  Stress:  Not on file  Social Connections: Not on file  Intimate Partner Violence: Not on file    Review of Systems  All other systems reviewed and are negative.       Objective    BP 134/89   Pulse 100   Temp (!) 97 F (36.1 C) (Oral)   Resp 16   Wt 276 lb (125.2 kg)   SpO2 96%   BMI 45.93 kg/m   Physical Exam Vitals and nursing note reviewed.  Constitutional:  General: She is not in acute distress.    Appearance: She is obese.  Cardiovascular:     Rate and Rhythm: Normal rate and regular rhythm.  Pulmonary:     Effort: Pulmonary effort is normal.     Breath sounds: Normal breath sounds.  Musculoskeletal:     Right ankle: No deformity. Tenderness present over the lateral malleolus. Decreased range of motion.  Neurological:     General: No focal deficit present.     Mental Status: She is alert and oriented to person, place, and time.  Psychiatric:        Mood and Affect: Affect normal. Mood is anxious.        Behavior: Behavior normal.         Assessment & Plan:   1. Pain in joint involving ankle and foot, unspecified laterality RICE. Ibuprofen    No follow-ups on file.   Becky Sax, MD

## 2022-04-13 DIAGNOSIS — G43909 Migraine, unspecified, not intractable, without status migrainosus: Secondary | ICD-10-CM | POA: Diagnosis not present

## 2022-04-19 ENCOUNTER — Ambulatory Visit (INDEPENDENT_AMBULATORY_CARE_PROVIDER_SITE_OTHER): Payer: Medicaid Other | Admitting: Sports Medicine

## 2022-04-19 ENCOUNTER — Ambulatory Visit (INDEPENDENT_AMBULATORY_CARE_PROVIDER_SITE_OTHER): Payer: Medicaid Other

## 2022-04-19 DIAGNOSIS — M25572 Pain in left ankle and joints of left foot: Secondary | ICD-10-CM

## 2022-04-19 DIAGNOSIS — M25571 Pain in right ankle and joints of right foot: Secondary | ICD-10-CM

## 2022-04-19 DIAGNOSIS — G8929 Other chronic pain: Secondary | ICD-10-CM

## 2022-04-19 DIAGNOSIS — M25561 Pain in right knee: Secondary | ICD-10-CM | POA: Diagnosis not present

## 2022-04-19 DIAGNOSIS — M542 Cervicalgia: Secondary | ICD-10-CM

## 2022-04-19 DIAGNOSIS — M7061 Trochanteric bursitis, right hip: Secondary | ICD-10-CM

## 2022-04-19 DIAGNOSIS — M5412 Radiculopathy, cervical region: Secondary | ICD-10-CM | POA: Diagnosis not present

## 2022-04-19 DIAGNOSIS — M222X2 Patellofemoral disorders, left knee: Secondary | ICD-10-CM

## 2022-04-19 DIAGNOSIS — M222X1 Patellofemoral disorders, right knee: Secondary | ICD-10-CM

## 2022-04-19 DIAGNOSIS — M25562 Pain in left knee: Secondary | ICD-10-CM | POA: Diagnosis not present

## 2022-04-19 DIAGNOSIS — M25551 Pain in right hip: Secondary | ICD-10-CM | POA: Diagnosis not present

## 2022-04-19 MED ORDER — PREDNISONE 50 MG PO TABS: ORAL_TABLET | ORAL | 0 refills | Status: DC

## 2022-04-19 NOTE — Assessment & Plan Note (Signed)
Moderate pain right lateral hip localized over the trochanteric bursa, weak hip abductors on the ipsilateral side. I would like x-rays, I also explained the anatomy and pathophysiology, adding formal physical therapy, return to see me in 6 weeks, injection if not better.

## 2022-04-19 NOTE — Assessment & Plan Note (Signed)
This is a pleasant 42 year old female, she has had a long history of pain in her neck with radiation down over the right shoulder, down to the fingertips with numbness and tingling. Worse when driving. Shoulder exam is unrevealing raising the suspicion of a cervical pain generator. Adding cervical spine x-rays, prednisone, formal PT. In 6 weeks if not better we will consider MRI for interventional planning.

## 2022-04-19 NOTE — Assessment & Plan Note (Signed)
Theresa Phillips is also having bilateral knee pain present anteriorly after a fall, her exam is for the most part benign without patellar crepitus, considering symptomatology I think patellofemoral syndrome is likely at play, explained the pathophysiology and the need for vastus medialis conditioning, I would like x-rays, she will also work on this and physical therapy.

## 2022-04-19 NOTE — Assessment & Plan Note (Signed)
Medial send bilateral ankle pain, instability, she has inverted her right ankle multiple times. She does have positive Klieger test, positive talar tilt, positive anterior drawer signs bilaterally. I would like bilateral ankle x-rays, she will work hard in physical therapy, return to see me in 6 weeks, MRIs if no better.

## 2022-04-19 NOTE — Progress Notes (Signed)
    Procedures performed today:    None.  Independent interpretation of notes and tests performed by another provider:   None.  Brief History, Exam, Impression, and Recommendations:    Radiculitis of right cervical region This is a pleasant 42 year old female, she has had a long history of pain in her neck with radiation down over the right shoulder, down to the fingertips with numbness and tingling. Worse when driving. Shoulder exam is unrevealing raising the suspicion of a cervical pain generator. Adding cervical spine x-rays, prednisone, formal PT. In 6 weeks if not better we will consider MRI for interventional planning.  Trochanteric bursitis, right hip Moderate pain right lateral hip localized over the trochanteric bursa, weak hip abductors on the ipsilateral side. I would like x-rays, I also explained the anatomy and pathophysiology, adding formal physical therapy, return to see me in 6 weeks, injection if not better.  Patellofemoral syndrome, bilateral Jeannett Senior is also having bilateral knee pain present anteriorly after a fall, her exam is for the most part benign without patellar crepitus, considering symptomatology I think patellofemoral syndrome is likely at play, explained the pathophysiology and the need for vastus medialis conditioning, I would like x-rays, she will also work on this and physical therapy.  Bilateral ankle pain Medial send bilateral ankle pain, instability, she has inverted her right ankle multiple times. She does have positive Klieger test, positive talar tilt, positive anterior drawer signs bilaterally. I would like bilateral ankle x-rays, she will work hard in physical therapy, return to see me in 6 weeks, MRIs if no better.    ____________________________________________ Gwen Her. Dianah Field, M.D., ABFM., CAQSM., AME. Primary Care and Sports Medicine [facility]  Adjunct Professor of [place] [employer] [facility] [employer]  Risk manager

## 2022-04-20 DIAGNOSIS — M25511 Pain in right shoulder: Secondary | ICD-10-CM | POA: Diagnosis not present

## 2022-04-20 DIAGNOSIS — R635 Abnormal weight gain: Secondary | ICD-10-CM | POA: Diagnosis not present

## 2022-04-20 DIAGNOSIS — M25551 Pain in right hip: Secondary | ICD-10-CM | POA: Diagnosis not present

## 2022-04-20 DIAGNOSIS — G8929 Other chronic pain: Secondary | ICD-10-CM | POA: Diagnosis not present

## 2022-04-20 DIAGNOSIS — Z79899 Other long term (current) drug therapy: Secondary | ICD-10-CM | POA: Diagnosis not present

## 2022-04-20 DIAGNOSIS — G4700 Insomnia, unspecified: Secondary | ICD-10-CM | POA: Diagnosis not present

## 2022-04-20 DIAGNOSIS — Z6841 Body Mass Index (BMI) 40.0 and over, adult: Secondary | ICD-10-CM | POA: Diagnosis not present

## 2022-04-20 DIAGNOSIS — M25562 Pain in left knee: Secondary | ICD-10-CM | POA: Diagnosis not present

## 2022-04-20 DIAGNOSIS — M25561 Pain in right knee: Secondary | ICD-10-CM | POA: Diagnosis not present

## 2022-04-20 DIAGNOSIS — M542 Cervicalgia: Secondary | ICD-10-CM | POA: Diagnosis not present

## 2022-04-26 DIAGNOSIS — Z79899 Other long term (current) drug therapy: Secondary | ICD-10-CM | POA: Diagnosis not present

## 2022-04-28 ENCOUNTER — Ambulatory Visit
Admission: RE | Admit: 2022-04-28 | Discharge: 2022-04-28 | Disposition: A | Discharge status: Home / Self Care | Payer: Medicaid Other | Source: Ambulatory Visit | Attending: Family Medicine | Admitting: Family Medicine

## 2022-04-28 DIAGNOSIS — Z1231 Encounter for screening mammogram for malignant neoplasm of breast: Secondary | ICD-10-CM

## 2022-05-02 DIAGNOSIS — M542 Cervicalgia: Secondary | ICD-10-CM | POA: Diagnosis not present

## 2022-05-02 DIAGNOSIS — M25561 Pain in right knee: Secondary | ICD-10-CM | POA: Diagnosis not present

## 2022-05-02 DIAGNOSIS — M25571 Pain in right ankle and joints of right foot: Secondary | ICD-10-CM | POA: Diagnosis not present

## 2022-05-02 DIAGNOSIS — M25572 Pain in left ankle and joints of left foot: Secondary | ICD-10-CM | POA: Diagnosis not present

## 2022-05-03 DIAGNOSIS — G43711 Chronic migraine without aura, intractable, with status migrainosus: Secondary | ICD-10-CM | POA: Diagnosis not present

## 2022-05-03 DIAGNOSIS — Z79891 Long term (current) use of opiate analgesic: Secondary | ICD-10-CM | POA: Diagnosis not present

## 2022-05-03 DIAGNOSIS — G43901 Migraine, unspecified, not intractable, with status migrainosus: Secondary | ICD-10-CM | POA: Diagnosis not present

## 2022-05-07 ENCOUNTER — Other Ambulatory Visit: Payer: Self-pay | Admitting: Family Medicine

## 2022-05-07 DIAGNOSIS — J309 Allergic rhinitis, unspecified: Secondary | ICD-10-CM

## 2022-05-07 DIAGNOSIS — R21 Rash and other nonspecific skin eruption: Secondary | ICD-10-CM | POA: Diagnosis not present

## 2022-05-09 DIAGNOSIS — M25572 Pain in left ankle and joints of left foot: Secondary | ICD-10-CM | POA: Diagnosis not present

## 2022-05-09 DIAGNOSIS — M25571 Pain in right ankle and joints of right foot: Secondary | ICD-10-CM | POA: Diagnosis not present

## 2022-05-09 DIAGNOSIS — M25561 Pain in right knee: Secondary | ICD-10-CM | POA: Diagnosis not present

## 2022-05-09 DIAGNOSIS — M542 Cervicalgia: Secondary | ICD-10-CM | POA: Diagnosis not present

## 2022-05-22 ENCOUNTER — Other Ambulatory Visit: Payer: Self-pay | Admitting: Psychiatry

## 2022-05-22 DIAGNOSIS — M25561 Pain in right knee: Secondary | ICD-10-CM | POA: Diagnosis not present

## 2022-05-22 DIAGNOSIS — Z6841 Body Mass Index (BMI) 40.0 and over, adult: Secondary | ICD-10-CM | POA: Diagnosis not present

## 2022-05-22 DIAGNOSIS — M25571 Pain in right ankle and joints of right foot: Secondary | ICD-10-CM | POA: Diagnosis not present

## 2022-05-22 DIAGNOSIS — G4700 Insomnia, unspecified: Secondary | ICD-10-CM | POA: Diagnosis not present

## 2022-05-22 DIAGNOSIS — Z79899 Other long term (current) drug therapy: Secondary | ICD-10-CM | POA: Diagnosis not present

## 2022-05-22 DIAGNOSIS — M25551 Pain in right hip: Secondary | ICD-10-CM | POA: Diagnosis not present

## 2022-05-22 DIAGNOSIS — M25572 Pain in left ankle and joints of left foot: Secondary | ICD-10-CM | POA: Diagnosis not present

## 2022-05-22 DIAGNOSIS — M542 Cervicalgia: Secondary | ICD-10-CM | POA: Diagnosis not present

## 2022-05-22 DIAGNOSIS — M25562 Pain in left knee: Secondary | ICD-10-CM | POA: Diagnosis not present

## 2022-05-22 DIAGNOSIS — R635 Abnormal weight gain: Secondary | ICD-10-CM | POA: Diagnosis not present

## 2022-05-22 DIAGNOSIS — M25511 Pain in right shoulder: Secondary | ICD-10-CM | POA: Diagnosis not present

## 2022-05-23 NOTE — Telephone Encounter (Signed)
Per 12/12/21 phone call pt request care to be transferred for migraines to [facility],. Phone: [phone], Fax: [phone].  Rx denied.

## 2022-05-24 DIAGNOSIS — M25561 Pain in right knee: Secondary | ICD-10-CM | POA: Diagnosis not present

## 2022-05-24 DIAGNOSIS — M542 Cervicalgia: Secondary | ICD-10-CM | POA: Diagnosis not present

## 2022-05-24 DIAGNOSIS — M25572 Pain in left ankle and joints of left foot: Secondary | ICD-10-CM | POA: Diagnosis not present

## 2022-05-24 DIAGNOSIS — M25571 Pain in right ankle and joints of right foot: Secondary | ICD-10-CM | POA: Diagnosis not present

## 2022-05-25 ENCOUNTER — Other Ambulatory Visit: Payer: Self-pay | Admitting: Obstetrics and Gynecology

## 2022-05-29 ENCOUNTER — Telehealth: Payer: Self-pay | Admitting: Obstetrics and Gynecology

## 2022-05-29 DIAGNOSIS — M542 Cervicalgia: Secondary | ICD-10-CM | POA: Diagnosis not present

## 2022-05-29 DIAGNOSIS — M25571 Pain in right ankle and joints of right foot: Secondary | ICD-10-CM | POA: Diagnosis not present

## 2022-05-29 DIAGNOSIS — M25561 Pain in right knee: Secondary | ICD-10-CM | POA: Diagnosis not present

## 2022-05-29 DIAGNOSIS — M25572 Pain in left ankle and joints of left foot: Secondary | ICD-10-CM | POA: Diagnosis not present

## 2022-05-29 MED ORDER — NORGESTIMATE-ETH ESTRADIOL 0.25-35 MG-MCG PO TABS: 1.0000 | ORAL_TABLET | Freq: Every day | ORAL | 2 refills | Status: DC

## 2022-05-29 NOTE — Telephone Encounter (Signed)
Patient called requesting refill on her OCP.  She stated she has sent the refill request thru MyChart as well as thru her pharmacy.    Refilling x 3 per protocol.    Patient has upcoming appointment in June.

## 2022-05-31 DIAGNOSIS — M25571 Pain in right ankle and joints of right foot: Secondary | ICD-10-CM | POA: Diagnosis not present

## 2022-05-31 DIAGNOSIS — M25572 Pain in left ankle and joints of left foot: Secondary | ICD-10-CM | POA: Diagnosis not present

## 2022-05-31 DIAGNOSIS — M25561 Pain in right knee: Secondary | ICD-10-CM | POA: Diagnosis not present

## 2022-05-31 DIAGNOSIS — M542 Cervicalgia: Secondary | ICD-10-CM | POA: Diagnosis not present

## 2022-06-01 ENCOUNTER — Ambulatory Visit (INDEPENDENT_AMBULATORY_CARE_PROVIDER_SITE_OTHER): Payer: Medicaid Other | Admitting: Sports Medicine

## 2022-06-01 DIAGNOSIS — M25572 Pain in left ankle and joints of left foot: Secondary | ICD-10-CM | POA: Diagnosis not present

## 2022-06-01 DIAGNOSIS — M222X1 Patellofemoral disorders, right knee: Secondary | ICD-10-CM

## 2022-06-01 DIAGNOSIS — M25571 Pain in right ankle and joints of right foot: Secondary | ICD-10-CM

## 2022-06-01 DIAGNOSIS — G8929 Other chronic pain: Secondary | ICD-10-CM

## 2022-06-01 DIAGNOSIS — M25562 Pain in left knee: Secondary | ICD-10-CM | POA: Diagnosis not present

## 2022-06-01 DIAGNOSIS — M25551 Pain in right hip: Secondary | ICD-10-CM | POA: Diagnosis not present

## 2022-06-01 DIAGNOSIS — M222X2 Patellofemoral disorders, left knee: Secondary | ICD-10-CM

## 2022-06-01 DIAGNOSIS — M25561 Pain in right knee: Secondary | ICD-10-CM

## 2022-06-01 DIAGNOSIS — G43719 Chronic migraine without aura, intractable, without status migrainosus: Secondary | ICD-10-CM | POA: Diagnosis not present

## 2022-06-01 DIAGNOSIS — M5412 Radiculopathy, cervical region: Secondary | ICD-10-CM | POA: Diagnosis not present

## 2022-06-01 MED ORDER — GABAPENTIN 800 MG PO TABS
800.0000 mg | ORAL_TABLET | Freq: Three times a day (TID) | ORAL | 3 refills | Status: AC
Start: 2022-06-01 — End: ?

## 2022-06-01 NOTE — Progress Notes (Signed)
    Procedures performed today:    None.  Independent interpretation of notes and tests performed by another provider:   None.  Brief History, Exam, Impression, and Recommendations:    Bilateral ankle pain Theresa Phillips returns, she has multiple complaints, we saw her for bilateral ankle pain at the last visit, instability, status post multiple ankle inversion injuries, x-rays were unrevealing, she has done a lot better in physical therapy and is doing well, no further intervention needed.  Chronic pain of both knees Continues to improve but still needs to work through physical therapy.  Chronic right hip pain Theresa Phillips did have some pain right lateral hip initially localized over the greater trochanteric bursa, hip abductor's were weak, she has been doing some aggressive physical therapy, abductor strength has improved considerably and pain is now localized more at the SI joint, she did have some SI joint degenerative changes on x-rays. Continue home PT, and we will target her SI joint at a future visit if needed.  Patellofemoral syndrome, bilateral As above much better after PT, continues to improve, x-rays unrevealing, return to see me as needed, we can consider injections if she plateaus.  Radiculitis of right cervical region Theresa Phillips the has also had some improvement in her neck pain. She has been doing physical therapy. She did have a cervical spine MRI back in 2023 that was unrevealing. Pain is localized to right trapezius and right periscapular but lesser so on the left, continues with therapy. She is on gabapentin for headaches. She does have discrete areas of tenderness, combined with a negative cervical spine MRI I do suspect there is likely a myofascial component to her pain. We explained the pathophysiology of myofascial pain syndrome and fibromyalgia. Increasing gabapentin to 800 mg 3 times daily, continue therapy and we can consider trigger point injections if not better in the  future.    ____________________________________________ Theresa Phillips. Theresa Phillips, M.D., ABFM., CAQSM., AME. Primary Care and Sports Medicine [facility]  Adjunct Professor of Family Medicine  [place] [employer]  Restaurant manager, fast food

## 2022-06-01 NOTE — Assessment & Plan Note (Signed)
Theresa Phillips did have some pain right lateral hip initially localized over the greater trochanteric bursa, hip abductor's were weak, she has been doing some aggressive physical therapy, abductor strength has improved considerably and pain is now localized more at the SI joint, she did have some SI joint degenerative changes on x-rays. Continue home PT, and we will target her SI joint at a future visit if needed.

## 2022-06-01 NOTE — Assessment & Plan Note (Signed)
Min the has also had some improvement in her neck pain. She has been doing physical therapy. She did have a cervical spine MRI back in 2023 that was unrevealing. Pain is localized to right trapezius and right periscapular but lesser so on the left, continues with therapy. She is on gabapentin for headaches. She does have discrete areas of tenderness, combined with a negative cervical spine MRI I do suspect there is likely a myofascial component to her pain. We explained the pathophysiology of myofascial pain syndrome and fibromyalgia. Increasing gabapentin to 800 mg 3 times daily, continue therapy and we can consider trigger point injections if not better in the future.

## 2022-06-01 NOTE — Assessment & Plan Note (Signed)
Continues to improve but still needs to work through physical therapy.

## 2022-06-01 NOTE — Assessment & Plan Note (Signed)
Theresa Phillips returns, she has multiple complaints, we saw her for bilateral ankle pain at the last visit, instability, status post multiple ankle inversion injuries, x-rays were unrevealing, she has done a lot better in physical therapy and is doing well, no further intervention needed.

## 2022-06-01 NOTE — Assessment & Plan Note (Signed)
As above much better after PT, continues to improve, x-rays unrevealing, return to see me as needed, we can consider injections if she plateaus.

## 2022-06-05 DIAGNOSIS — M25572 Pain in left ankle and joints of left foot: Secondary | ICD-10-CM | POA: Diagnosis not present

## 2022-06-05 DIAGNOSIS — M25561 Pain in right knee: Secondary | ICD-10-CM | POA: Diagnosis not present

## 2022-06-05 DIAGNOSIS — M25571 Pain in right ankle and joints of right foot: Secondary | ICD-10-CM | POA: Diagnosis not present

## 2022-06-05 DIAGNOSIS — M542 Cervicalgia: Secondary | ICD-10-CM | POA: Diagnosis not present

## 2022-06-07 DIAGNOSIS — M25561 Pain in right knee: Secondary | ICD-10-CM | POA: Diagnosis not present

## 2022-06-07 DIAGNOSIS — M25572 Pain in left ankle and joints of left foot: Secondary | ICD-10-CM | POA: Diagnosis not present

## 2022-06-07 DIAGNOSIS — M542 Cervicalgia: Secondary | ICD-10-CM | POA: Diagnosis not present

## 2022-06-07 DIAGNOSIS — M25571 Pain in right ankle and joints of right foot: Secondary | ICD-10-CM | POA: Diagnosis not present

## 2022-06-13 DIAGNOSIS — M25572 Pain in left ankle and joints of left foot: Secondary | ICD-10-CM | POA: Diagnosis not present

## 2022-06-13 DIAGNOSIS — M542 Cervicalgia: Secondary | ICD-10-CM | POA: Diagnosis not present

## 2022-06-13 DIAGNOSIS — M25561 Pain in right knee: Secondary | ICD-10-CM | POA: Diagnosis not present

## 2022-06-13 DIAGNOSIS — M25571 Pain in right ankle and joints of right foot: Secondary | ICD-10-CM | POA: Diagnosis not present

## 2022-06-14 ENCOUNTER — Encounter: Payer: Self-pay | Admitting: Family Medicine

## 2022-06-14 ENCOUNTER — Encounter: Payer: Medicaid Other | Admitting: Family Medicine

## 2022-06-14 ENCOUNTER — Ambulatory Visit (INDEPENDENT_AMBULATORY_CARE_PROVIDER_SITE_OTHER): Payer: Medicaid Other | Admitting: Family Medicine

## 2022-06-14 ENCOUNTER — Encounter (INDEPENDENT_AMBULATORY_CARE_PROVIDER_SITE_OTHER): Payer: Medicaid Other | Admitting: Sports Medicine

## 2022-06-14 VITALS — BP 121/84 | HR 86 | Temp 98.1°F | Resp 16 | Wt 275.6 lb

## 2022-06-14 DIAGNOSIS — Z0000 Encounter for general adult medical examination without abnormal findings: Secondary | ICD-10-CM

## 2022-06-14 DIAGNOSIS — Z13220 Encounter for screening for lipoid disorders: Secondary | ICD-10-CM

## 2022-06-14 DIAGNOSIS — Z13228 Encounter for screening for other metabolic disorders: Secondary | ICD-10-CM

## 2022-06-14 DIAGNOSIS — Z130 Encounter for screening for diseases of the blood and blood-forming organs and certain disorders involving the immune mechanism: Secondary | ICD-10-CM

## 2022-06-14 DIAGNOSIS — E559 Vitamin D deficiency, unspecified: Secondary | ICD-10-CM | POA: Diagnosis not present

## 2022-06-14 DIAGNOSIS — Z1329 Encounter for screening for other suspected endocrine disorder: Secondary | ICD-10-CM | POA: Diagnosis not present

## 2022-06-14 DIAGNOSIS — R7303 Prediabetes: Secondary | ICD-10-CM

## 2022-06-14 DIAGNOSIS — M5459 Other low back pain: Secondary | ICD-10-CM

## 2022-06-14 NOTE — Telephone Encounter (Signed)
I spent 5 total minutes of online digital evaluation and management services in this patient-initiated request for online care. 

## 2022-06-15 DIAGNOSIS — M25572 Pain in left ankle and joints of left foot: Secondary | ICD-10-CM | POA: Diagnosis not present

## 2022-06-15 DIAGNOSIS — M25571 Pain in right ankle and joints of right foot: Secondary | ICD-10-CM | POA: Diagnosis not present

## 2022-06-15 DIAGNOSIS — M542 Cervicalgia: Secondary | ICD-10-CM | POA: Diagnosis not present

## 2022-06-15 DIAGNOSIS — M25561 Pain in right knee: Secondary | ICD-10-CM | POA: Diagnosis not present

## 2022-06-15 NOTE — Progress Notes (Signed)
Patient is here for their 3 month follow-up Patient has no concerns today Care gaps have been discussed with patient  

## 2022-06-16 LAB — CBC WITH DIFFERENTIAL/PLATELET
Basophils Absolute: 0 10*3/uL (ref 0.0–0.2)
Basos: 0 %
EOS (ABSOLUTE): 0.1 10*3/uL (ref 0.0–0.4)
Eos: 2 %
Hematocrit: 39.1 % (ref 34.0–46.6)
Hemoglobin: 12.4 g/dL (ref 11.1–15.9)
Immature Grans (Abs): 0 10*3/uL (ref 0.0–0.1)
Immature Granulocytes: 0 %
Lymphocytes Absolute: 1.8 10*3/uL (ref 0.7–3.1)
Lymphs: 37 %
MCH: 26.1 pg — ABNORMAL LOW (ref 26.6–33.0)
MCHC: 31.7 g/dL (ref 31.5–35.7)
MCV: 82 fL (ref 79–97)
Monocytes Absolute: 0.3 10*3/uL (ref 0.1–0.9)
Monocytes: 6 %
Neutrophils Absolute: 2.7 10*3/uL (ref 1.4–7.0)
Neutrophils: 55 %
Platelets: 334 10*3/uL (ref 150–450)
RBC: 4.75 x10E6/uL (ref 3.77–5.28)
RDW: 14.4 % (ref 11.7–15.4)
WBC: 4.9 10*3/uL (ref 3.4–10.8)

## 2022-06-16 LAB — HEMOGLOBIN A1C
Est. average glucose Bld gHb Est-mCnc: 126 mg/dL
Hgb A1c MFr Bld: 6 % — ABNORMAL HIGH (ref 4.8–5.6)

## 2022-06-16 LAB — CMP14+EGFR

## 2022-06-16 LAB — LIPID PANEL

## 2022-06-16 LAB — VITAMIN D 25 HYDROXY (VIT D DEFICIENCY, FRACTURES): Vit D, 25-Hydroxy: 49.5 ng/mL (ref 30.0–100.0)

## 2022-06-16 LAB — TSH

## 2022-06-16 NOTE — Progress Notes (Unsigned)
Established Patient Office Visit  Subjective    Patient ID: Theresa Phillips, female    DOB: 1980-05-15  Age: 42 y.o. MRN: [id number]  CC:  Chief Complaint  Patient presents with   Follow-up    HPI Theresa Phillips presents to establish care   Outpatient Encounter Medications as of 06/14/2022  Medication Sig   traZODone (DESYREL) 50 MG tablet Take 50 mg by mouth at bedtime as needed.   AJOVY 225 MG/1.5ML SOAJ Inject into the skin.   amitriptyline (ELAVIL) 25 MG tablet    betamethasone acetate-betamethasone sodium phosphate (CELESTONE) 6 (3-3) MG/ML injection Inject into the muscle.   botulinum toxin Type A (BOTOX) 200 units injection Inject 155 units into the head and neck muscles every 90 days.   clindamycin (CLINDAGEL) 1 % gel as needed.   clobetasol ointment (TEMOVATE) 0.05 % Apply to affected area every night for 4 weeks, then every other day for 4 weeks and then twice a week for 4 weeks or until resolution.   clotrimazole-betamethasone (LOTRISONE) cream Apply topically 2 (two) times daily.   emollient (BIAFINE) cream Apply topically.   ergocalciferol (VITAMIN D2) 1.25 MG (50000 UT) capsule Take 1 capsule by mouth once a week.   famotidine (PEPCID) 20 MG tablet Take 20 mg by mouth 2 (two) times daily as needed.   Fluticasone Propionate 93 MCG/ACT EXHU Place 1 spray into the nose in the morning and at bedtime.   Fremanezumab-vfrm 225 MG/1.5ML SOSY Inject into the skin.   gabapentin (NEURONTIN) 800 MG tablet Take 1 tablet (800 mg total) by mouth 3 (three) times daily.   Galcanezumab-gnlm (EMGALITY) 120 MG/ML SOSY INJECT 2 PENS (240MG ) UNDER SKIN FOR ONE DOSE THEN INJECT 1 PEN SUBCUTANEOUSLY MONTHLY THEREAFTER   HYDROcodone Bitartrate ER (HYSINGLA ER) 20 MG T24A TAKE 1 TABLET TWICE DAILY FOR 7 DAYS   HYDROcodone-acetaminophen (NORCO/VICODIN) 5-325 MG tablet Take by mouth.   hydrOXYzine (ATARAX) 25 MG tablet Take 25 mg by mouth 2 (two) times daily.   hydrOXYzine (VISTARIL) 25 MG capsule  Take by mouth.   ibuprofen (ADVIL) 600 MG tablet Take 1 tablet (600 mg total) by mouth every 8 (eight) hours as needed.   ibuprofen (ADVIL) 800 MG tablet Take 1 tablet (800 mg total) by mouth every 8 (eight) hours as needed.   Iron, Ferrous Sulfate, 325 (65 Fe) MG TABS Take 325 mg by mouth daily.   levocetirizine (XYZAL) 5 MG tablet TAKE 1 TABLET (5 MG TOTAL) BY MOUTH DAILY.   loratadine (CLARITIN) 10 MG tablet Take 1 tablet (10 mg total) by mouth daily.   lubiprostone (AMITIZA) 24 MCG capsule Take by mouth.   metFORMIN (GLUCOPHAGE-XR) 500 MG 24 hr tablet TAKE 1 TABLET (500 MG TOTAL) BY MOUTH DAILY.   neomycin-polymyxin-hydrocortisone (CORTISPORIN) 3.5-10000-1 OTIC suspension Apply 1-2 drops daily after soaking and cover with bandaid   norgestimate-ethinyl estradiol (ORTHO-CYCLEN) 0.25-35 MG-MCG tablet Take 1 tablet by mouth daily.   norgestimate-ethinyl estradiol (ORTHO-CYCLEN) 0.25-35 MG-MCG tablet Take 1 tablet by mouth daily.   nystatin-triamcinolone ointment (MYCOLOG) Apply 1 application  topically 2 (two) times daily.   olopatadine (PATANOL) 0.1 % ophthalmic solution PLACE ONE DROP INTO BOTH EYES 2 TIMES DAILY.   ondansetron (ZOFRAN) 8 MG tablet Take by mouth.   ondansetron (ZOFRAN-ODT) 8 MG disintegrating tablet Take 1 tablet (8 mg total) by mouth every 8 (eight) hours as needed for nausea or vomiting.   pantoprazole (PROTONIX) 40 MG tablet Take by mouth.   phentermine 15 MG capsule Take  15 mg by mouth every morning.   predniSONE (DELTASONE) 10 MG tablet Take by mouth.   predniSONE (DELTASONE) 50 MG tablet One tab PO daily for 5 days.   prochlorperazine (COMPAZINE) 10 MG tablet    Rimegepant Sulfate 75 MG TBDP Take 75 mg by mouth as needed (migraine). Max dose 1 pill in 24 hours   rizatriptan (MAXALT-MLT) 10 MG disintegrating tablet Take 1 tablet (10 mg total) by mouth as needed for migraine. May repeat in 2 hours if needed. Max dose 2 pills in 24 hours   sucralfate (CARAFATE) 1 g tablet  Take by mouth.   SUMAtriptan (IMITREX) 100 MG tablet Take 100 mg by mouth as directed.   tiZANidine (ZANAFLEX) 4 MG tablet    traMADol (ULTRAM) 50 MG tablet Take 50 mg by mouth 2 (two) times daily as needed.   TRULANCE 3 MG TABS Take by mouth.   No facility-administered encounter medications on file as of 06/14/2022.    Past Medical History:  Diagnosis Date   Allergy    See list   Anxiety 05/2014   Chronic pain syndrome    Clotting disorder (HCC)    Jan/Feb 23   GERD (gastroesophageal reflux disease)    Migraines    Neuromuscular disorder (HCC)    PCOS (polycystic ovarian syndrome)    PTSD (post-traumatic stress disorder)    Seizures (HCC)    Sinusitis    Sleep apnea    7 years ago, CPAP   Vitamin D deficiency     Past Surgical History:  Procedure Laterality Date   EXPLORATORY LAPAROTOMY     WISDOM TOOTH EXTRACTION      Family History  Problem Relation Age of Onset   Early death Mother    Bipolar disorder Father    Diabetes Maternal Grandfather    Diabetes Maternal Aunt    Breast cancer Paternal Aunt     Social History   Socioeconomic History   Marital status: Single    Spouse name: Not on file   Number of children: 0   Years of education: Not on file   Highest education level: Doctorate  Occupational History    Comment: Hydrologist    Comment: Geologist, engineering  Tobacco Use   Smoking status: Never   Smokeless tobacco: Never  Vaping Use   Vaping Use: Never used  Substance and Sexual Activity   Alcohol use: Yes    Comment: I drink socially every blue moon.   Drug use: Never   Sexual activity: Not Currently    Birth control/protection: Abstinence, Condom, Pill  Other Topics Concern   Not on file  Social History Narrative   Lives with room mate   Caffeine none.   Social Determinants of Health   Financial Resource Strain: Low Risk  (05/31/2022)   Overall Financial Resource Strain (CARDIA)    Difficulty of Paying Living Expenses: Not very  hard  Food Insecurity: No Food Insecurity (05/31/2022)   Hunger Vital Sign    Worried About Running Out of Food in the Last Year: Never true    Ran Out of Food in the Last Year: Never true  Transportation Needs: No Transportation Needs (05/31/2022)   PRAPARE - Administrator, Civil Service (Medical): No    Lack of Transportation (Non-Medical): No  Physical Activity: Unknown (05/31/2022)   Exercise Vital Sign    Days of Exercise per Week: 0 days    Minutes of Exercise per Session: Not on file  Stress:  No Stress Concern Present (05/31/2022)   [employer] of Occupational Health - Occupational Stress Questionnaire    Feeling of Stress : Not at all  Social Connections: Moderately Integrated (05/31/2022)   Social Connection and Isolation Panel [NHANES]    Frequency of Communication with Friends and Family: More than three times a week    Frequency of Social Gatherings with Friends and Family: Twice a week    Attends Religious Services: 1 to 4 times per year    Active Member of [employer] or Organizations: Yes    Attends Banker Meetings: 1 to 4 times per year    Marital Status: Never married  Intimate Partner Violence: Not on file    ROS      Objective    BP 121/84   Pulse 86   Temp 98.1 F (36.7 C) (Oral)   Resp 16   Wt 275 lb 9.6 oz (125 kg)   SpO2 96%   BMI 45.86 kg/m   Physical Exam  {Labs (Optional):23779}    Assessment & Plan:   Problem List Items Addressed This Visit       Other   Vitamin D deficiency   Relevant Orders   Vitamin D, 25-hydroxy   Other Visit Diagnoses     Annual physical exam    -  Primary   Relevant Orders   CMP14+EGFR   Screening for deficiency anemia       Relevant Orders   CBC with Differential   Screening for lipid disorders       Relevant Orders   Lipid Panel   Screening for endocrine/metabolic/immunity disorders       Relevant Orders   TSH   Vitamin D, 25-hydroxy   Prediabetes       Relevant Orders    Hemoglobin A1c       No follow-ups on file.   Tommie Raymond, MD

## 2022-06-20 ENCOUNTER — Encounter: Payer: Self-pay | Admitting: Family Medicine

## 2022-06-20 DIAGNOSIS — M25572 Pain in left ankle and joints of left foot: Secondary | ICD-10-CM | POA: Diagnosis not present

## 2022-06-20 DIAGNOSIS — M542 Cervicalgia: Secondary | ICD-10-CM | POA: Diagnosis not present

## 2022-06-20 DIAGNOSIS — M25571 Pain in right ankle and joints of right foot: Secondary | ICD-10-CM | POA: Diagnosis not present

## 2022-06-20 DIAGNOSIS — M25561 Pain in right knee: Secondary | ICD-10-CM | POA: Diagnosis not present

## 2022-06-21 ENCOUNTER — Telehealth: Payer: Self-pay

## 2022-06-21 DIAGNOSIS — M25572 Pain in left ankle and joints of left foot: Secondary | ICD-10-CM | POA: Diagnosis not present

## 2022-06-21 DIAGNOSIS — G4700 Insomnia, unspecified: Secondary | ICD-10-CM | POA: Diagnosis not present

## 2022-06-21 DIAGNOSIS — M25571 Pain in right ankle and joints of right foot: Secondary | ICD-10-CM | POA: Diagnosis not present

## 2022-06-21 DIAGNOSIS — M25561 Pain in right knee: Secondary | ICD-10-CM | POA: Diagnosis not present

## 2022-06-21 DIAGNOSIS — M25562 Pain in left knee: Secondary | ICD-10-CM | POA: Diagnosis not present

## 2022-06-21 DIAGNOSIS — R635 Abnormal weight gain: Secondary | ICD-10-CM | POA: Diagnosis not present

## 2022-06-21 DIAGNOSIS — M542 Cervicalgia: Secondary | ICD-10-CM | POA: Diagnosis not present

## 2022-06-21 DIAGNOSIS — Z79899 Other long term (current) drug therapy: Secondary | ICD-10-CM | POA: Diagnosis not present

## 2022-06-21 DIAGNOSIS — M25551 Pain in right hip: Secondary | ICD-10-CM | POA: Diagnosis not present

## 2022-06-21 DIAGNOSIS — M25511 Pain in right shoulder: Secondary | ICD-10-CM | POA: Diagnosis not present

## 2022-06-21 DIAGNOSIS — Z6841 Body Mass Index (BMI) 40.0 and over, adult: Secondary | ICD-10-CM | POA: Diagnosis not present

## 2022-06-21 NOTE — Telephone Encounter (Signed)
..   Medicaid Managed Care   Unsuccessful Outreach Note  06/21/2022 Name: Theresa Phillips MRN: [id number] DOB: 18-Apr-1980  Referred by: Georganna Skeans, MD Reason for referral : Appointment (I called the patient today to get her scheduled with the MM BSW for Care Management services. I left my name and number on her voicemail.)   An unsuccessful telephone outreach was attempted today. The patient was referred to the case management team for assistance with care management and care coordination.   Follow Up Plan: A HIPAA compliant phone message was left for the patient providing contact information and requesting a return call.  The care management team will reach out to the patient again over the next 5 days.   Weston Settle Care Guide  [facility] Managed  Care Guide [facility]  [phone]

## 2022-06-22 DIAGNOSIS — G43E09 Chronic migraine with aura, not intractable, without status migrainosus: Secondary | ICD-10-CM | POA: Diagnosis not present

## 2022-06-26 DIAGNOSIS — M542 Cervicalgia: Secondary | ICD-10-CM | POA: Diagnosis not present

## 2022-06-26 DIAGNOSIS — M25572 Pain in left ankle and joints of left foot: Secondary | ICD-10-CM | POA: Diagnosis not present

## 2022-06-26 DIAGNOSIS — K5909 Other constipation: Secondary | ICD-10-CM | POA: Diagnosis not present

## 2022-06-26 DIAGNOSIS — M25571 Pain in right ankle and joints of right foot: Secondary | ICD-10-CM | POA: Diagnosis not present

## 2022-06-26 DIAGNOSIS — M25561 Pain in right knee: Secondary | ICD-10-CM | POA: Diagnosis not present

## 2022-06-28 ENCOUNTER — Other Ambulatory Visit: Payer: Medicaid Other

## 2022-06-28 DIAGNOSIS — M25561 Pain in right knee: Secondary | ICD-10-CM | POA: Diagnosis not present

## 2022-06-28 DIAGNOSIS — M25571 Pain in right ankle and joints of right foot: Secondary | ICD-10-CM | POA: Diagnosis not present

## 2022-06-28 DIAGNOSIS — M542 Cervicalgia: Secondary | ICD-10-CM | POA: Diagnosis not present

## 2022-06-28 DIAGNOSIS — M25572 Pain in left ankle and joints of left foot: Secondary | ICD-10-CM | POA: Diagnosis not present

## 2022-06-28 NOTE — Patient Instructions (Signed)
Visit Information  [name] was given information about Medicaid Managed Care team care coordination services as a part of their Healthy [employer] Medicaid benefit. [name] verbally consented to engagement with the [facility] Managed Care team.   If you are experiencing a medical emergency, please call 911 or report to your local emergency department or urgent care.   If you have a non-emergency medical problem during routine business hours, please contact your provider's office and ask to speak with a nurse.   For questions related to your Healthy [facility] health plan, please call: [phone] or visit the homepage here: [email or web address]  If you would like to schedule transportation through your Healthy [facility] plan, please call the following number at least 2 days in advance of your appointment: [phone]  For information about your ride after you set it up, call Ride Assist at [phone]. Use this number to activate a Will Call pickup, or if your transportation is late for a scheduled pickup. Use this number, too, if you need to make a change or cancel a previously scheduled reservation.  If you need transportation services right away, call [phone]. The after-hours call center is staffed 24 hours to handle ride assistance and urgent reservation requests (including discharges) 365 days a year. Urgent trips include sick visits, hospital discharge requests and life-sustaining treatment.  Call the [facility] Line at [phone], at any time, 24 hours a day, 7 days a week. If you are in danger or need immediate medical attention call 911.  If you would like help to quit smoking, call 1-800-QUIT-NOW (867-192-4502) OR Espaol: 1-855-Djelo-Ya (4-742-595-6387) o para ms informacin haga clic aqu or Text READY to 564-332 to register via text  Theresa Phillips - following are the goals we discussed in your visit today:   Goals  Addressed   None      Social Worker will follow up in 30 days.   Gus Puma, Kenard Gower, MHA [facility]  Managed Medicaid Social Worker [phone]   Following is a copy of your plan of care:  There are no care plans that you recently modified to display for this patient.
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
## 2022-07-04 DIAGNOSIS — G43E01 Chronic migraine with aura, not intractable, with status migrainosus: Secondary | ICD-10-CM | POA: Diagnosis not present

## 2022-07-04 DIAGNOSIS — R0602 Shortness of breath: Secondary | ICD-10-CM | POA: Diagnosis not present

## 2022-07-04 DIAGNOSIS — M542 Cervicalgia: Secondary | ICD-10-CM | POA: Diagnosis not present

## 2022-07-04 DIAGNOSIS — G4700 Insomnia, unspecified: Secondary | ICD-10-CM | POA: Diagnosis not present

## 2022-07-04 DIAGNOSIS — M797 Fibromyalgia: Secondary | ICD-10-CM | POA: Diagnosis not present

## 2022-07-04 DIAGNOSIS — Z0000 Encounter for general adult medical examination without abnormal findings: Secondary | ICD-10-CM | POA: Diagnosis not present

## 2022-07-06 ENCOUNTER — Ambulatory Visit (INDEPENDENT_AMBULATORY_CARE_PROVIDER_SITE_OTHER): Payer: Medicaid Other | Admitting: Sports Medicine

## 2022-07-06 DIAGNOSIS — M542 Cervicalgia: Secondary | ICD-10-CM

## 2022-07-06 DIAGNOSIS — G8929 Other chronic pain: Secondary | ICD-10-CM

## 2022-07-06 DIAGNOSIS — M25551 Pain in right hip: Secondary | ICD-10-CM

## 2022-07-06 NOTE — Progress Notes (Signed)
    Procedures performed today:    Procedure:  Injection of #3 right trapezial trigger point Consent obtained and verified. Time-out conducted. Noted no overlying erythema, induration, or other signs of local infection. Skin prepped in a sterile fashion. Topical analgesic spray: Ethyl chloride. Completed without difficulty. Meds: 1 cc kenalog 40, 4 cc lidocaine injected easily and spread out between 3 trapezial trigger point. Advised to call if fevers/chills, erythema, induration, drainage, or persistent bleeding.  Independent interpretation of notes and tests performed by another provider:   None.  Brief History, Exam, Impression, and Recommendations:    Chronic right hip pain Theresa Phillips has chronic right lateral hip pain, localized over the greater trochanteric bursa as well as somewhat proximal, at the last visit her hip abductor's were quite weak, we recommended aggressive PT, hip abductor strength continues to improve dramatically, she does still have discomfort now in spite of analgesics, with unrevealing x-rays, she has failed over 6 weeks of physical therapy, for this reason we will proceed with MRI. I am not sure as to whether her pain is coming from her right sacroiliac joint or her right hip, I would like an updated right hip MRI without contrast to further elucidate. If I do not see obvious or dramatic pathology in her right hip we will consider a right sacroiliac joint injection.  Chronic neck pain Theresa Phillips would also like to discuss her chronic right-sided neck pain that she localizes over the right trapezius. She has done physical therapy, she has had an MRI in 2023 that was normal. I suggested more myofascial pain syndrome versus fibromyalgia process, we increased her gabapentin to 800 mg 3 times daily, and she has done somewhat better, she still has discomfort so we will proceed today with trigger point injections into the right trapezius. Return to see me in 6 weeks for  this.    ____________________________________________ Ihor Austin. Benjamin Stain, M.D., ABFM., CAQSM., AME. Primary Care and Sports Medicine [facility]  Adjunct Professor of Family Medicine  [place]  Restaurant manager, fast food

## 2022-07-06 NOTE — Assessment & Plan Note (Signed)
Theresa Phillips would also like to discuss her chronic right-sided neck pain that she localizes over the right trapezius. She has done physical therapy, she has had an MRI in 2023 that was normal. I suggested more myofascial pain syndrome versus fibromyalgia process, we increased her gabapentin to 800 mg 3 times daily, and she has done somewhat better, she still has discomfort so we will proceed today with trigger point injections into the right trapezius. Return to see me in 6 weeks for this.

## 2022-07-06 NOTE — Assessment & Plan Note (Signed)
Theresa Phillips has chronic right lateral hip pain, localized over the greater trochanteric bursa as well as somewhat proximal, at the last visit her hip abductor's were quite weak, we recommended aggressive PT, hip abductor strength continues to improve dramatically, she does still have discomfort now in spite of analgesics, with unrevealing x-rays, she has failed over 6 weeks of physical therapy, for this reason we will proceed with MRI. I am not sure as to whether her pain is coming from her right sacroiliac joint or her right hip, I would like an updated right hip MRI without contrast to further elucidate. If I do not see obvious or dramatic pathology in her right hip we will consider a right sacroiliac joint injection.

## 2022-07-10 ENCOUNTER — Encounter: Payer: Self-pay | Admitting: Obstetrics and Gynecology

## 2022-07-10 ENCOUNTER — Ambulatory Visit (INDEPENDENT_AMBULATORY_CARE_PROVIDER_SITE_OTHER): Payer: Medicaid Other | Admitting: Obstetrics and Gynecology

## 2022-07-10 VITALS — BP 132/89 | HR 107 | Ht 65.0 in | Wt 275.0 lb

## 2022-07-10 DIAGNOSIS — Z01419 Encounter for gynecological examination (general) (routine) without abnormal findings: Secondary | ICD-10-CM

## 2022-07-10 DIAGNOSIS — Z1339 Encounter for screening examination for other mental health and behavioral disorders: Secondary | ICD-10-CM

## 2022-07-10 MED ORDER — NORGESTIMATE-ETH ESTRADIOL 0.25-35 MG-MCG PO TABS: 1.0000 | ORAL_TABLET | Freq: Every day | ORAL | 4 refills | Status: DC

## 2022-07-10 NOTE — Progress Notes (Signed)
Subjective:     Theresa Phillips is a 42 y.o. female P0 with LMP 06/23/22 and BMI 45 who is here for a comprehensive physical exam. The patient reports no problems. She is not sexually active. She is no longer trying to conceive. She has met with a nutritionist a few times to help with weight management. Patient denies pelvic pain or abnormal discharge. She is without any complaints  Past Medical History:  Diagnosis Date   Allergy    See list   Anxiety 05/2014   Chronic pain syndrome    Clotting disorder (HCC)    Jan/Feb 23   GERD (gastroesophageal reflux disease)    Migraines    Neuromuscular disorder (HCC)    PCOS (polycystic ovarian syndrome)    PTSD (post-traumatic stress disorder)    Seizures (HCC)    Sinusitis    Sleep apnea    7 years ago, CPAP   Vitamin D deficiency    Past Surgical History:  Procedure Laterality Date   EXPLORATORY LAPAROTOMY     WISDOM TOOTH EXTRACTION     Family History  Problem Relation Age of Onset   Early death Mother    Bipolar disorder Father    Diabetes Maternal Grandfather    Diabetes Maternal Aunt    Breast cancer Paternal Aunt     Social History   Socioeconomic History   Marital status: Single    Spouse name: Not on file   Number of children: 0   Years of education: Not on file   Highest education level: Doctorate  Occupational History    Comment: Hydrologist    Comment: Geologist, engineering  Tobacco Use   Smoking status: Never   Smokeless tobacco: Never  Vaping Use   Vaping Use: Never used  Substance and Sexual Activity   Alcohol use: Yes    Comment: I drink socially every blue moon.   Drug use: Never   Sexual activity: Not Currently    Birth control/protection: Abstinence, Condom, Pill  Other Topics Concern   Not on file  Social History Narrative   Lives with room mate   Caffeine none.   Social Determinants of Health   Financial Resource Strain: Low Risk  (05/31/2022)   Overall Financial Resource Strain (CARDIA)     Difficulty of Paying Living Expenses: Not very hard  Food Insecurity: No Food Insecurity (05/31/2022)   Hunger Vital Sign    Worried About Running Out of Food in the Last Year: Never true    Ran Out of Food in the Last Year: Never true  Transportation Needs: No Transportation Needs (05/31/2022)   PRAPARE - Administrator, Civil Service (Medical): No    Lack of Transportation (Non-Medical): No  Physical Activity: Unknown (05/31/2022)   Exercise Vital Sign    Days of Exercise per Week: 0 days    Minutes of Exercise per Session: Not on file  Stress: No Stress Concern Present (05/31/2022)   [employer] of Occupational Health - Occupational Stress Questionnaire    Feeling of Stress : Not at all  Social Connections: Moderately Integrated (05/31/2022)   Social Connection and Isolation Panel [NHANES]    Frequency of Communication with Friends and Family: More than three times a week    Frequency of Social Gatherings with Friends and Family: Twice a week    Attends Religious Services: 1 to 4 times per year    Active Member of [employer] or Organizations: Yes    Attends Banker  Meetings: 1 to 4 times per year    Marital Status: Never married  Intimate Partner Violence: Not on file   Health Maintenance  Topic Date Due   COVID-19 Vaccine (4 - 2023-24 season) 09/23/2021   HIV Screening  01/23/2023 (Originally 12/25/1993)   MAMMOGRAM  04/28/2023   DTaP/Tdap/Td (2 - Td or Tdap) 12/02/2023   PAP SMEAR-Modifier  07/01/2024   Hepatitis C Screening  Completed   HPV VACCINES  Aged Out   INFLUENZA VACCINE  Discontinued       Review of Systems Pertinent items noted in HPI and remainder of comprehensive ROS otherwise negative.   Objective:  Blood pressure 132/89, pulse (!) 107, height 5\' 5"  (1.651 m), weight 275 lb (124.7 kg), last menstrual period 06/23/2022.   GENERAL: Well-developed, well-nourished female in no acute distress.  HEENT: Normocephalic, atraumatic. Sclerae  anicteric.  NECK: Supple. Normal thyroid.  LUNGS: Clear to auscultation bilaterally.  HEART: Regular rate and rhythm. BREASTS: Symmetric in size. No palpable masses or lymphadenopathy, skin changes, or nipple drainage. ABDOMEN: Soft, nontender, nondistended. No organomegaly. PELVIC: Normal external female genitalia. Vagina is pink and rugated.  Normal discharge. Normal appearing cervix. Uterus is normal in size. No adnexal mass or tenderness. Chaperone present during the pelvic exam EXTREMITIES: No cyanosis, clubbing, or edema, 2+ distal pulses.     Assessment:    Healthy female exam.      Plan:    Pap smear normal 06/2021 Patient is current on mammogram Refill on COC provided Patient will have labs with PCP in 3 month See After Visit Summary for Counseling Recommendations

## 2022-07-10 NOTE — Progress Notes (Signed)
42 y.o GYN presents for AEX.  Declined STD screening.  Pt requested BC refills.  Last Mammogram 04/28/22

## 2022-07-18 ENCOUNTER — Other Ambulatory Visit: Payer: Medicaid Other

## 2022-07-18 NOTE — Patient Instructions (Signed)
Visit Information  Theresa Phillips was given information about Medicaid Managed Care team care coordination services as a part of their [facility] Community Plan Medicaid benefit. Theresa Phillips verbally consented to engagement with the [facility] Managed Care team.   If you are experiencing a medical emergency, please call 911 or report to your local emergency department or urgent care.   If you have a non-emergency medical problem during routine business hours, please contact your provider's office and ask to speak with a nurse.   For questions related to your [facility], please call: [phone] or visit the homepage here: [email or web address]  If you would like to schedule transportation through your [facility], please call the following number at least 2 days in advance of your appointment: [phone]   Rides for urgent appointments can also be made after hours by calling Member Services.  Call the Behavioral Health Crisis Line at [phone], at any time, 24 hours a day, 7 days a week. If you are in danger or need immediate medical attention call 911.  If you would like help to quit smoking, call 1-800-QUIT-NOW (854-284-9376) OR Espaol: 1-855-Djelo-Ya (1-324-401-0272) o para ms informacin haga clic aqu or Text READY to 536-644 to register via text  Ms. Charm Barges - following are the goals we discussed in your visit today:   Goals Addressed   None      Social Worker will follow up in 30 days.   Gus Puma, Kenard Gower, MHA [facility]  Managed Medicaid Social Worker [phone]   Following is a copy of your plan of care:  There are no care plans that you recently modified to display for this patient.

## 2022-07-18 NOTE — Patient Outreach (Signed)
Medicaid Managed Care Social Work Note  07/18/2022 Name:  Theresa Phillips MRN:  [id number] DOB:  [birth year]  Theresa Phillips is an 42 y.o. year old female who is a primary patient of Theresa Skeans, MD.  The Medicaid Managed Care Coordination team was consulted for assistance with:   Housing   Theresa Phillips was given information about Medicaid Managed Care Coordination team services today. Theresa Phillips Patient agreed to services and verbal consent obtained.  Engaged with patient  for by telephone forfollow up visit in response to referral for case management and/or care coordination services.   Assessments/Interventions:  Review of past medical history, allergies, medications, health status, including review of consultants reports, laboratory and other test data, was performed as part of comprehensive evaluation and provision of chronic care management services.  SDOH: (Social Determinant of Health) assessments and interventions performed: SDOH Interventions    Flowsheet Row Nutrition from 09/21/2021 in [facility] at [facility]  SDOH Interventions   Food Insecurity Interventions Intervention Not Indicated     BSW completed a telephone outreach with patient, she did receive the resources BSW emailed to her but states they either dont have immediate avaliablity or she does not quailify. BSW and patient spoke about the tailored plans. BSW will email information to patient and also informed the process of switching over.  Advanced Directives Status:  Not addressed in this encounter.  Care Plan                 Allergies  Allergen Reactions   Lanolin Itching and Other (See Comments)   Penicillins Rash and Other (See Comments)   Almond Oil Other (See Comments)   Ca Phosphate-Cholecalciferol Other (See Comments)   Cheese Other (See Comments)   Guaifenesin Other (See Comments)   Methocarbamol Other (See Comments)   Naproxen Other (See Comments)     Precipitates migraines but other NSAIDS ok   Omeprazole Other (See Comments)    headache   Other Other (See Comments)   Peanut (Diagnostic) Other (See Comments)   Peanut Allergen Powder-Dnfp Other (See Comments)   Peanut-Containing Drug Products Other (See Comments)   Pork Allergy Other (See Comments)   Pork-Derived Products Other (See Comments)   Topiramate Other (See Comments)    Tingling in hands and feet   Clindamycin/Lincomycin Itching   Oxycodone Itching   Tylenol [Acetaminophen] Other (See Comments)    Gives me "migraines".  States she can take it mixed with hydrocodone.      Medications Reviewed Today     Reviewed by Theresa Antigua, MD (Physician) on 07/10/22 at 1130  Med List Status: <None>   Medication Order Taking? Sig Documenting Provider Last Dose Status Informant  amitriptyline (ELAVIL) 25 MG tablet [id number] Yes  [provider] Taking Active   betamethasone acetate-betamethasone sodium phosphate (CELESTONE) 6 (3-3) MG/ML injection [id number] No Inject into the muscle.  Patient not taking: Reported on 07/10/2022   [provider] Not Taking Active   botulinum toxin Type A (BOTOX) 200 units injection [id number] Yes Inject 155 units into the head and neck muscles every 90 days. Theresa Doyne, MD Taking Active            Med Note Theresa Phillips Mar 09, 2022  2:23 PM)    clindamycin (CLINDAGEL) 1 % gel [id number] Yes as needed. [provider] Taking Active   clobetasol ointment (TEMOVATE) 0.05 % [id number] Yes Apply to affected area every night for 4 weeks,  then every other day for 4 weeks and then twice a week for 4 weeks or until resolution. Theresa Phillips, Peggy, MD Taking Active   clotrimazole-betamethasone (LOTRISONE) cream [id number] Yes Apply topically 2 (two) times daily. Theresa Phillips, Peggy, MD Taking Active   emollient (BIAFINE) cream [id number] Yes Apply topically. [provider] Taking Active   ergocalciferol (VITAMIN D2) 1.25  MG (50000 UT) capsule [id number] No Take 1 capsule by mouth once a week.  Patient not taking: Reported on 07/10/2022   [provider] Not Taking Active   famotidine (PEPCID) 20 MG tablet [id number] Yes Take 20 mg by mouth 2 (two) times daily as needed. [provider] Taking Active   Fluticasone Propionate 93 MCG/ACT EXHU [id number] Yes Place 1 spray into the nose in the morning and at bedtime. Theresa Skeans, MD Taking Active   Fremanezumab-vfrm 225 MG/1.5ML Theresa Phillips [id number] Yes Inject into the skin. [provider] Taking Active   gabapentin (NEURONTIN) 800 MG tablet [id number] Yes Take 1 tablet (800 mg total) by mouth 3 (three) times daily. Theresa Becton, MD Taking Active   HYDROcodone Bitartrate ER [facility]) 20 MG T24A [id number] No TAKE 1 TABLET TWICE DAILY FOR 7 DAYS  Patient not taking: Reported on 07/10/2022   [provider] Not Taking Active   HYDROcodone-acetaminophen (NORCO/VICODIN) 5-325 MG tablet [id number] No Take by mouth.  Patient not taking: Reported on 07/10/2022   [provider] Not Taking Active   hydrOXYzine (ATARAX) 25 MG tablet [id number] No Take 25 mg by mouth 2 (two) times daily.  Patient not taking: Reported on 07/10/2022   [provider] Not Taking Active   hydrOXYzine (VISTARIL) 25 MG capsule [id number] No Take by mouth.  Patient not taking: Reported on 07/10/2022   [provider] Not Taking Active   ibuprofen (ADVIL) 600 MG tablet [id number] No Take 1 tablet (600 mg total) by mouth every 8 (eight) hours as needed.  Patient not taking: Reported on 07/10/2022   Theresa Simmonds, PA-C Not Taking Active   ibuprofen (ADVIL) 800 MG tablet [id number] No Take 1 tablet (800 mg total) by mouth every 8 (eight) hours as needed.  Patient not taking: Reported on 07/10/2022   Theresa Skeans, MD Not Taking Active   Iron, Ferrous Sulfate, 325 (65 Fe) MG TABS [id number] No Take 325 mg by mouth daily.  Patient not  taking: Reported on 07/10/2022   Theresa Skeans, MD Not Taking Active   levocetirizine (XYZAL) 5 MG tablet [id number] Yes TAKE 1 TABLET (5 MG TOTAL) BY MOUTH DAILY. Theresa Skeans, MD Taking Active   loratadine (CLARITIN) 10 MG tablet [id number] No Take 1 tablet (10 mg total) by mouth daily.  Patient not taking: Reported on 07/10/2022   Theresa Skeans, MD Not Taking Active   lubiprostone (AMITIZA) 24 MCG capsule [id number] No Take by mouth.  Patient not taking: Reported on 07/10/2022   [provider] Not Taking Active   metFORMIN (GLUCOPHAGE-XR) 500 MG 24 hr tablet [id number] Yes TAKE 1 TABLET (500 MG TOTAL) BY MOUTH DAILY. Theresa Skeans, MD Taking Active   neomycin-polymyxin-hydrocortisone (CORTISPORIN) 3.5-10000-1 OTIC suspension [id number] No Apply 1-2 drops daily after soaking and cover with bandaid  Patient not taking: Reported on 07/10/2022   Edwin Cap, DPM Not Taking Active   norgestimate-ethinyl estradiol (ORTHO-CYCLEN) 0.25-35 MG-MCG tablet [id number]  Take 1 tablet by mouth daily. Theresa Phillips, Peggy, MD  Active   norgestimate-ethinyl estradiol (ORTHO-CYCLEN) 0.25-35 MG-MCG tablet [id number]  Take 1 tablet by mouth daily. Theresa Phillips,  Gigi Gin, MD  Active   nystatin-triamcinolone ointment [facility]) [id number] Yes Apply 1 application  topically 2 (two) times daily. Theresa Phillips, Peggy, MD Taking Active   olopatadine (PATANOL) 0.1 % ophthalmic solution [id number] Yes PLACE ONE DROP INTO BOTH EYES 2 TIMES DAILY. Theresa Skeans, MD Taking Active   ondansetron [facility]) 8 MG tablet [id number] No Take by mouth.  Patient not taking: Reported on 07/10/2022   [provider] Not Taking Active   ondansetron (ZOFRAN-ODT) 8 MG disintegrating tablet [id number] Yes Take 1 tablet (8 mg total) by mouth every 8 (eight) hours as needed for nausea or vomiting. Theresa Doyne, MD Taking Active   pantoprazole (PROTONIX) 40 MG tablet [id number] Yes Take by mouth. [provider] Taking Active    phentermine 15 MG capsule [id number] No Take 15 mg by mouth every morning.  Patient not taking: Reported on 07/10/2022   [provider] Not Taking Active   predniSONE (DELTASONE) 10 MG tablet [id number]  Take by mouth. [provider]  Active            Med Note Maple Hudson, Charlott Rakes Nov 03, 2021 11:29 AM) Leanora Ivanoff as needed.  predniSONE (DELTASONE) 50 MG tablet [id number] No One tab PO daily for 5 days.  Patient not taking: Reported on 07/10/2022   Theresa Becton, MD Not Taking Active   prochlorperazine (COMPAZINE) 10 MG tablet [id number] No   Patient not taking: Reported on 07/10/2022   [provider] Not Taking Active   Rimegepant Sulfate 75 MG TBDP [id number] Yes Take 75 mg by mouth as needed (migraine). Max dose 1 pill in 24 hours Theresa Doyne, MD Taking Active   rizatriptan (MAXALT-MLT) 10 MG disintegrating tablet [id number] Yes Take 1 tablet (10 mg total) by mouth as needed for migraine. May repeat in 2 hours if needed. Max dose 2 pills in 24 hours Theresa Doyne, MD Taking Active   sucralfate (CARAFATE) 1 g tablet [id number] Yes Take by mouth. [provider] Taking Active   SUMAtriptan (IMITREX) 100 MG tablet [id number] No Take 100 mg by mouth as directed.  Patient not taking: Reported on 07/10/2022   [provider] Not Taking Active   tiZANidine (ZANAFLEX) 4 MG tablet [id number] Yes  [provider] Taking Active   traMADol (ULTRAM) 50 MG tablet [id number] Yes Take 50 mg by mouth 2 (two) times daily as needed. [provider] Taking Active   traZODone (DESYREL) 50 MG tablet [id number] No Take 50 mg by mouth at bedtime as needed.  Patient not taking: Reported on 07/10/2022   [provider] Not Taking Active   TRULANCE 3 MG TABS [id number] Yes Take by mouth. [provider] Taking Active             Patient Active Problem List   Diagnosis Date Noted   Chronic neck pain 04/19/2022   Chronic right  hip pain 04/19/2022   Patellofemoral syndrome, bilateral 04/19/2022   Bilateral ankle pain 04/19/2022   Sensory disorder of trigeminal nerve 01/19/2021   Recurrent boils 12/01/2020   Chronic nausea 05/13/2020   Acanthosis nigricans 03/05/2020   Constipation 03/05/2020   Irritable bowel syndrome 03/05/2020   Sciatica 03/05/2020   Facet arthropathy, lumbar 02/17/2020   Chronic migraine without aura, intractable, with status migrainosus 12/10/2019   Chronic daily headache 11/03/2019   Chronic pain syndrome 10/30/2019   Lumbar degenerative disc disease 10/30/2019   Lumbar facet joint pain 10/30/2019   Lumbar radiculopathy 10/30/2019   Carpal tunnel  syndrome of left wrist 04/04/2019   Carpal tunnel syndrome of right wrist 04/04/2019   Migraine 03/20/2019   Upper airway cough syndrome/ PNDS ? allergic rhinitis  03/01/2019   OSA on CPAP 03/01/2019   DOE (dyspnea on exertion) 02/28/2019   Tremor 02/26/2019   Bilateral carpal tunnel syndrome 02/13/2019   Radial styloid tenosynovitis of left hand 02/13/2019   Radial styloid tenosynovitis of right hand 02/13/2019   Obesity, Class III, BMI 40-49.9 (morbid obesity) (HCC) 04/07/2018   Morbid obesity with BMI of 45.0-49.9, adult (HCC) 04/07/2018   Cognitive complaints 05/30/2017   PCOS (polycystic ovarian syndrome) 12/09/2015   Numbness and tingling in left hand 01/12/2015   Bilateral lower extremity pain 08/07/2014   Sleep related rhythmic movement disorder 07/13/2014   Acute pain of right shoulder 06/29/2014   Midline low back pain without sciatica 06/29/2014   Multiple joint pain 06/29/2014   Chronic pain of both knees 06/29/2014   Orthostasis 11/27/2013   Gastritis, chronic 08/12/2013   Allergic rhinitis 01/27/2013   Vitamin D deficiency 11/27/2012   Chest pain 09/12/2012    Conditions to be addressed/monitored per PCP order:   housing  There are no care plans that you recently modified to display for this patient.   Follow up:   Patient agrees to Care Plan and Follow-up.  Plan: The Managed Medicaid care management team will reach out to the patient again over the next 30 days.  Date/time of next scheduled Social Work care management/care coordination outreach:  08/17/22  Gus Puma, Kenard Gower, [facility]  Managed [facility] Social Worker [phone]

## 2022-07-20 DIAGNOSIS — G4733 Obstructive sleep apnea (adult) (pediatric): Secondary | ICD-10-CM | POA: Diagnosis not present

## 2022-07-22 ENCOUNTER — Ambulatory Visit: Payer: Medicaid Other

## 2022-07-22 DIAGNOSIS — M160 Bilateral primary osteoarthritis of hip: Secondary | ICD-10-CM | POA: Diagnosis not present

## 2022-07-22 DIAGNOSIS — R002 Palpitations: Secondary | ICD-10-CM | POA: Diagnosis not present

## 2022-07-22 DIAGNOSIS — M25511 Pain in right shoulder: Secondary | ICD-10-CM | POA: Diagnosis not present

## 2022-07-22 DIAGNOSIS — L858 Other specified epidermal thickening: Secondary | ICD-10-CM | POA: Diagnosis not present

## 2022-07-22 DIAGNOSIS — M25561 Pain in right knee: Secondary | ICD-10-CM | POA: Diagnosis not present

## 2022-07-22 DIAGNOSIS — M25551 Pain in right hip: Secondary | ICD-10-CM | POA: Diagnosis not present

## 2022-07-22 DIAGNOSIS — G8929 Other chronic pain: Secondary | ICD-10-CM | POA: Diagnosis not present

## 2022-07-22 DIAGNOSIS — M542 Cervicalgia: Secondary | ICD-10-CM | POA: Diagnosis not present

## 2022-07-22 DIAGNOSIS — M25451 Effusion, right hip: Secondary | ICD-10-CM | POA: Diagnosis not present

## 2022-07-22 DIAGNOSIS — Z6841 Body Mass Index (BMI) 40.0 and over, adult: Secondary | ICD-10-CM | POA: Diagnosis not present

## 2022-07-22 DIAGNOSIS — M25562 Pain in left knee: Secondary | ICD-10-CM | POA: Diagnosis not present

## 2022-07-22 DIAGNOSIS — G4700 Insomnia, unspecified: Secondary | ICD-10-CM | POA: Diagnosis not present

## 2022-07-22 DIAGNOSIS — Z79899 Other long term (current) drug therapy: Secondary | ICD-10-CM | POA: Diagnosis not present

## 2022-07-22 DIAGNOSIS — R635 Abnormal weight gain: Secondary | ICD-10-CM | POA: Diagnosis not present

## 2022-07-25 DIAGNOSIS — Z79899 Other long term (current) drug therapy: Secondary | ICD-10-CM | POA: Diagnosis not present

## 2022-07-29 ENCOUNTER — Encounter (INDEPENDENT_AMBULATORY_CARE_PROVIDER_SITE_OTHER): Payer: Medicaid Other | Admitting: Sports Medicine

## 2022-07-29 DIAGNOSIS — M25551 Pain in right hip: Secondary | ICD-10-CM | POA: Diagnosis not present

## 2022-07-29 DIAGNOSIS — G8929 Other chronic pain: Secondary | ICD-10-CM | POA: Diagnosis not present

## 2022-07-30 ENCOUNTER — Encounter: Payer: Self-pay | Admitting: Obstetrics and Gynecology

## 2022-07-31 NOTE — Telephone Encounter (Signed)
I spent 5 total minutes of online digital evaluation and management services in this patient-initiated request for online care. 

## 2022-08-14 DIAGNOSIS — R238 Other skin changes: Secondary | ICD-10-CM | POA: Diagnosis not present

## 2022-08-15 DIAGNOSIS — R002 Palpitations: Secondary | ICD-10-CM | POA: Diagnosis not present

## 2022-08-15 DIAGNOSIS — Z6841 Body Mass Index (BMI) 40.0 and over, adult: Secondary | ICD-10-CM | POA: Diagnosis not present

## 2022-08-15 DIAGNOSIS — R21 Rash and other nonspecific skin eruption: Secondary | ICD-10-CM | POA: Diagnosis not present

## 2022-08-17 ENCOUNTER — Other Ambulatory Visit: Payer: Medicaid Other

## 2022-08-17 ENCOUNTER — Other Ambulatory Visit (INDEPENDENT_AMBULATORY_CARE_PROVIDER_SITE_OTHER): Payer: Medicaid Other

## 2022-08-17 ENCOUNTER — Ambulatory Visit (INDEPENDENT_AMBULATORY_CARE_PROVIDER_SITE_OTHER): Payer: Medicaid Other | Admitting: Sports Medicine

## 2022-08-17 DIAGNOSIS — G8929 Other chronic pain: Secondary | ICD-10-CM

## 2022-08-17 DIAGNOSIS — M25551 Pain in right hip: Secondary | ICD-10-CM | POA: Diagnosis not present

## 2022-08-17 DIAGNOSIS — Z7251 High risk heterosexual behavior: Secondary | ICD-10-CM | POA: Diagnosis not present

## 2022-08-17 DIAGNOSIS — M542 Cervicalgia: Secondary | ICD-10-CM

## 2022-08-17 DIAGNOSIS — R233 Spontaneous ecchymoses: Secondary | ICD-10-CM | POA: Diagnosis not present

## 2022-08-17 DIAGNOSIS — Z79899 Other long term (current) drug therapy: Secondary | ICD-10-CM | POA: Diagnosis not present

## 2022-08-17 DIAGNOSIS — J029 Acute pharyngitis, unspecified: Secondary | ICD-10-CM | POA: Diagnosis not present

## 2022-08-17 NOTE — Patient Outreach (Signed)
  Medicaid Managed Care   Unsuccessful Outreach Note  08/17/2022 Name: Theresa Phillips MRN: [id number] DOB: 02/14/1980  Referred by: Georganna Skeans, MD Reason for referral : High Risk Managed Medicaid (MM Social work Unsuccessful telephone outreach)   An unsuccessful telephone outreach was attempted today. The patient was referred to the case management team for assistance with care management and care coordination.   Follow Up Plan: A HIPAA compliant phone message was left for the patient providing contact information and requesting a return call.   Abelino Derrick, MHA [facility]  Managed [facility] Social Worker [phone]

## 2022-08-17 NOTE — Progress Notes (Signed)
    Procedures performed today:    Procedure: Real-time Ultrasound Guided injection of the right hip joint Device: Samsung HS60  Verbal informed consent obtained.  Time-out conducted.  Noted no overlying erythema, induration, or other signs of local infection.  Skin prepped in a sterile fashion.  Local anesthesia: Topical Ethyl chloride.  With sterile technique and under real time ultrasound guidance: Mild effusion and arthritic changes, 1 cc Kenalog 40, 2 cc lidocaine, 2 cc bupivacaine injected easily Completed without difficulty  Advised to call if fevers/chills, erythema, induration, drainage, or persistent bleeding.  Images permanently stored and available for review in PACS.  Impression: Technically successful ultrasound guided injection.  Independent interpretation of notes and tests performed by another provider:   None.  Brief History, Exam, Impression, and Recommendations:    Primary osteoarthritis of right hip, right hip trochanteric bursitis and abductor tendinitis Theresa Phillips returns, she has chronic right anterolateral hip pain, she does localize the pain over the greater trochanteric bursa, but also proximally and anteriorly. She did some aggressive physical therapy and her hip abductor strength improved, she still had discomfort, we proceeded with MRI that showed hip osteoarthritis with effusion, there is also trochanteric bursitis and abductor tendinitis. Today her pain is predominantly anterolateral, I suspect coming more from the hip joint so we will do a diagnostic and therapeutic hip joint injection today. If insufficient improvement we will consider trochanteric bursa/hip abductor injection.  Chronic neck pain Chronic neck pain localized right trapezius and over the shoulder, negative cervical spine MRI in 2023, we have used gabapentin 800 mg 3 times a day, still had pain so I did trigger point injections into the right trapezius at the last visit, she has had a great  deal of improvement, we can watch this for now.    ____________________________________________ Theresa Phillips. Benjamin Stain, M.D., ABFM., CAQSM., AME. Primary Care and Sports Medicine [facility]  Adjunct Professor of Family Medicine  [employer] [facility]  Restaurant manager, fast food

## 2022-08-17 NOTE — Patient Instructions (Signed)
  Medicaid Managed Care   Unsuccessful Outreach Note  08/17/2022 Name: Theresa Phillips MRN: [id number] DOB: 02/14/1980  Referred by: Georganna Skeans, MD Reason for referral : High Risk Managed Medicaid (MM Social work Unsuccessful telephone outreach)   An unsuccessful telephone outreach was attempted today. The patient was referred to the case management team for assistance with care management and care coordination.   Follow Up Plan: A HIPAA compliant phone message was left for the patient providing contact information and requesting a return call.   Abelino Derrick, MHA [facility]  Managed [facility] Social Worker [phone]

## 2022-08-17 NOTE — Assessment & Plan Note (Signed)
Chronic neck pain localized right trapezius and over the shoulder, negative cervical spine MRI in 2023, we have used gabapentin 800 mg 3 times a day, still had pain so I did trigger point injections into the right trapezius at the last visit, she has had a great deal of improvement, we can watch this for now.

## 2022-08-17 NOTE — Assessment & Plan Note (Signed)
Theresa Phillips returns, she has chronic right anterolateral hip pain, she does localize the pain over the greater trochanteric bursa, but also proximally and anteriorly. She did some aggressive physical therapy and her hip abductor strength improved, she still had discomfort, we proceeded with MRI that showed hip osteoarthritis with effusion, there is also trochanteric bursitis and abductor tendinitis. Today her pain is predominantly anterolateral, I suspect coming more from the hip joint so we will do a diagnostic and therapeutic hip joint injection today. If insufficient improvement we will consider trochanteric bursa/hip abductor injection.

## 2022-08-18 DIAGNOSIS — M542 Cervicalgia: Secondary | ICD-10-CM | POA: Diagnosis not present

## 2022-08-18 DIAGNOSIS — M25551 Pain in right hip: Secondary | ICD-10-CM | POA: Diagnosis not present

## 2022-08-18 DIAGNOSIS — G8929 Other chronic pain: Secondary | ICD-10-CM | POA: Diagnosis not present

## 2022-08-18 MED ORDER — TRIAMCINOLONE ACETONIDE 40 MG/ML IJ SUSP
40.0000 mg | Freq: Once | INTRAMUSCULAR | Status: AC
Start: 2022-08-18 — End: 2022-08-18
  Administered 2022-08-18: 40 mg via INTRAMUSCULAR

## 2022-08-18 NOTE — Addendum Note (Signed)
Addended by: Carren Rang A on: 08/18/2022 11:54 AM   Modules accepted: Orders

## 2022-08-19 DIAGNOSIS — H1030 Unspecified acute conjunctivitis, unspecified eye: Secondary | ICD-10-CM | POA: Diagnosis not present

## 2022-08-19 DIAGNOSIS — J309 Allergic rhinitis, unspecified: Secondary | ICD-10-CM | POA: Diagnosis not present

## 2022-08-21 ENCOUNTER — Other Ambulatory Visit: Payer: Self-pay | Admitting: Obstetrics and Gynecology

## 2022-08-22 ENCOUNTER — Telehealth: Payer: Self-pay

## 2022-08-22 NOTE — Telephone Encounter (Signed)

## 2022-08-23 ENCOUNTER — Encounter: Payer: Self-pay | Admitting: Obstetrics and Gynecology

## 2022-08-31 DIAGNOSIS — G43719 Chronic migraine without aura, intractable, without status migrainosus: Secondary | ICD-10-CM | POA: Diagnosis not present

## 2022-09-06 DIAGNOSIS — H1010 Acute atopic conjunctivitis, unspecified eye: Secondary | ICD-10-CM | POA: Diagnosis not present

## 2022-09-06 DIAGNOSIS — R635 Abnormal weight gain: Secondary | ICD-10-CM | POA: Diagnosis not present

## 2022-09-06 DIAGNOSIS — Z79899 Other long term (current) drug therapy: Secondary | ICD-10-CM | POA: Diagnosis not present

## 2022-09-06 DIAGNOSIS — M542 Cervicalgia: Secondary | ICD-10-CM | POA: Diagnosis not present

## 2022-09-14 ENCOUNTER — Emergency Department (HOSPITAL_COMMUNITY)
Admission: EM | Admit: 2022-09-14 | Discharge: 2022-09-14 | Disposition: A | Discharge status: Home / Self Care | Payer: Medicaid Other | Attending: Emergency Medicine | Admitting: Emergency Medicine

## 2022-09-14 ENCOUNTER — Emergency Department (HOSPITAL_COMMUNITY): Payer: Medicaid Other

## 2022-09-14 ENCOUNTER — Other Ambulatory Visit: Payer: Self-pay

## 2022-09-14 ENCOUNTER — Encounter (HOSPITAL_COMMUNITY): Payer: Self-pay

## 2022-09-14 DIAGNOSIS — R1084 Generalized abdominal pain: Secondary | ICD-10-CM | POA: Diagnosis not present

## 2022-09-14 DIAGNOSIS — Z79899 Other long term (current) drug therapy: Secondary | ICD-10-CM | POA: Diagnosis not present

## 2022-09-14 DIAGNOSIS — R109 Unspecified abdominal pain: Secondary | ICD-10-CM | POA: Diagnosis not present

## 2022-09-14 DIAGNOSIS — Z7984 Long term (current) use of oral hypoglycemic drugs: Secondary | ICD-10-CM | POA: Insufficient documentation

## 2022-09-14 DIAGNOSIS — K429 Umbilical hernia without obstruction or gangrene: Secondary | ICD-10-CM | POA: Diagnosis not present

## 2022-09-14 DIAGNOSIS — G43709 Chronic migraine without aura, not intractable, without status migrainosus: Secondary | ICD-10-CM | POA: Diagnosis not present

## 2022-09-14 DIAGNOSIS — R195 Other fecal abnormalities: Secondary | ICD-10-CM

## 2022-09-14 DIAGNOSIS — R519 Headache, unspecified: Secondary | ICD-10-CM | POA: Diagnosis present

## 2022-09-14 DIAGNOSIS — Z91010 Allergy to peanuts: Secondary | ICD-10-CM | POA: Diagnosis not present

## 2022-09-14 DIAGNOSIS — K760 Fatty (change of) liver, not elsewhere classified: Secondary | ICD-10-CM | POA: Diagnosis not present

## 2022-09-14 DIAGNOSIS — G43701 Chronic migraine without aura, not intractable, with status migrainosus: Secondary | ICD-10-CM | POA: Insufficient documentation

## 2022-09-14 LAB — CBC
HCT: 39.6 % (ref 36.0–46.0)
Hemoglobin: 12.5 g/dL (ref 12.0–15.0)
MCH: 25.8 pg — ABNORMAL LOW (ref 26.0–34.0)
MCHC: 31.6 g/dL (ref 30.0–36.0)
MCV: 81.6 fL (ref 80.0–100.0)
Platelets: 302 10*3/uL (ref 150–400)
RBC: 4.85 MIL/uL (ref 3.87–5.11)
RDW: 15.7 % — ABNORMAL HIGH (ref 11.5–15.5)
WBC: 5.6 10*3/uL (ref 4.0–10.5)
nRBC: 0 % (ref 0.0–0.2)

## 2022-09-14 LAB — HCG, SERUM, QUALITATIVE: Preg, Serum: NEGATIVE

## 2022-09-14 LAB — URINALYSIS, ROUTINE W REFLEX MICROSCOPIC
Bilirubin Urine: NEGATIVE
Glucose, UA: NEGATIVE mg/dL
Hgb urine dipstick: NEGATIVE
Ketones, ur: NEGATIVE mg/dL
Leukocytes,Ua: NEGATIVE
Nitrite: NEGATIVE
Protein, ur: NEGATIVE mg/dL
Specific Gravity, Urine: 1.004 — ABNORMAL LOW (ref 1.005–1.030)
pH: 7 (ref 5.0–8.0)

## 2022-09-14 LAB — COMPREHENSIVE METABOLIC PANEL
ALT: 19 U/L (ref 0–44)
AST: 18 U/L (ref 15–41)
Albumin: 3.1 g/dL — ABNORMAL LOW (ref 3.5–5.0)
Alkaline Phosphatase: 88 U/L (ref 38–126)
Anion gap: 6 (ref 5–15)
BUN: 6 mg/dL (ref 6–20)
CO2: 25 mmol/L (ref 22–32)
Calcium: 8.7 mg/dL — ABNORMAL LOW (ref 8.9–10.3)
Chloride: 104 mmol/L (ref 98–111)
Creatinine, Ser: 0.84 mg/dL (ref 0.44–1.00)
GFR, Estimated: >60 mL/min (ref >60)
Glucose, Bld: 83 mg/dL (ref 70–99)
Potassium: 3.9 mmol/L (ref 3.5–5.1)
Sodium: 135 mmol/L (ref 135–145)
Total Bilirubin: 0.2 mg/dL — ABNORMAL LOW (ref 0.3–1.2)
Total Protein: 6.9 g/dL (ref 6.5–8.1)

## 2022-09-14 LAB — LIPASE, BLOOD: Lipase: 23 U/L (ref 11–51)

## 2022-09-14 MED ORDER — PROCHLORPERAZINE EDISYLATE 10 MG/2ML IJ SOLN
10.0000 mg | Freq: Once | INTRAMUSCULAR | Status: AC
  Administered 2022-09-14: 10 mg via INTRAMUSCULAR
  Filled 2022-09-14: qty 2

## 2022-09-14 NOTE — ED Triage Notes (Signed)
Pt states has not been felling well, fatigued since yesterday am; endorses dark colored stool this am; did online visit, sent by dr for further eval; not on thinners,hx migraines; c/o head pain 6/10, increasing nausea, mid abd pain; took zofran at home PTA; pt also states her eyes have been hurting intermittently for 4-5 weeks

## 2022-09-14 NOTE — ED Notes (Signed)
Phlebotomy to obtain labs. Patient refused IV. MD Nanavati aware.

## 2022-09-14 NOTE — ED Notes (Signed)
Awaiting patient from lobby 

## 2022-09-14 NOTE — ED Provider Notes (Signed)
[place] EMERGENCY DEPARTMENT AT [facility] Provider Note   CSN: [id number] Arrival date & time: 09/14/22  3329     History  No chief complaint on file.   Theresa Phillips is a 42 y.o. female.  HPI    42 year old patient comes in with chief complaint of abdominal pain, dark stools and generalized weakness/fatigue.  Patient also has secondary complaint of headaches that has been present for several months.  Patient has history of chronic migraines for which she sees neurologist.  She indicates that her current headache has been present now for several months.  She is getting Botox.  When the headache is severe, she will have sometimes weakness on the right side.  The symptoms of weakness are intermittent.  Headache is not positional, not worse when laying flat or at nighttime and patient denies any new vision changes.  This morning patient was having abdominal discomfort.  She thereafter went to the bathroom and had to strain.  However afterwards she had loose bowel movements which was dark in color.  She had another BM afterwards which was better.  Patient has generalized abdominal pain that is described as sharp pain.  No UTI-like symptoms.  Of note, patient has requested that we do not put an IV.  Home Medications Prior to Admission medications   Medication Sig Start Date End Date Taking? Authorizing Provider  phentermine (ADIPEX-P) 37.5 MG tablet Take 37.5 mg by mouth every morning. 09/06/22  Yes [provider]  amitriptyline (ELAVIL) 25 MG tablet  02/03/22   [provider]  betamethasone acetate-betamethasone sodium phosphate (CELESTONE) 6 (3-3) MG/ML injection Inject into the muscle. 01/26/20   [provider]  botulinum toxin Type A (BOTOX) 200 units injection Inject 155 units into the head and neck muscles every 90 days. 10/25/21   Ocie Doyne, MD  clindamycin (CLINDAGEL) 1 % gel as needed. 01/11/22   [provider]  clobetasol  ointment (TEMOVATE) 0.05 % Apply to affected area every night for 4 weeks, then every other day for 4 weeks and then twice a week for 4 weeks or until resolution. 11/14/21   Constant, Peggy, MD  clotrimazole-betamethasone (LOTRISONE) cream Apply topically 2 (two) times daily. 11/17/21   Constant, Peggy, MD  emollient (BIAFINE) cream Apply topically. 12/09/15   [provider]  ergocalciferol (VITAMIN D2) 1.25 MG (50000 UT) capsule Take 1 capsule by mouth once a week. 05/09/18   [provider]  famotidine (PEPCID) 20 MG tablet Take 20 mg by mouth 2 (two) times daily as needed. 10/02/21   [provider]  Fluticasone Propionate 93 MCG/ACT EXHU Place 1 spray into the nose in the morning and at bedtime. 03/09/22   Georganna Skeans, MD  Fremanezumab-vfrm 225 MG/1.5ML SOSY Inject into the skin. 03/22/22 03/22/23  [provider]  gabapentin (NEURONTIN) 800 MG tablet Take 1 tablet (800 mg total) by mouth 3 (three) times daily. 06/01/22   Monica Becton, MD  HYDROcodone Bitartrate ER [facility] ER) 20 MG T24A     [provider]  HYDROcodone-acetaminophen (NORCO/VICODIN) 5-325 MG tablet Take by mouth. 11/30/21   [provider]  hydrOXYzine (ATARAX) 25 MG tablet Take 25 mg by mouth 2 (two) times daily.    [provider]  hydrOXYzine (VISTARIL) 25 MG capsule Take by mouth. 03/07/22 03/07/23  [provider]  ibuprofen (ADVIL) 600 MG tablet Take 1 tablet (600 mg total) by mouth every 8 (eight) hours as needed. 10/12/21   Georgian Co  M, PA-C  ibuprofen (ADVIL) 800 MG tablet Take 1 tablet (800 mg total) by mouth every 8 (eight) hours as needed. 04/04/22   Georganna Skeans, MD  Iron, Ferrous Sulfate, 325 (65 Fe) MG TABS Take 325 mg by mouth daily. 03/09/22   Georganna Skeans, MD  levocetirizine (XYZAL) 5 MG tablet TAKE 1 TABLET (5 MG TOTAL) BY MOUTH DAILY. 05/08/22   Georganna Skeans, MD  loratadine (CLARITIN) 10 MG tablet Take 1 tablet (10 mg total)  by mouth daily. 03/09/22   Georganna Skeans, MD  lubiprostone (AMITIZA) 24 MCG capsule Take by mouth. 12/02/21   [provider]  metFORMIN (GLUCOPHAGE-XR) 500 MG 24 hr tablet TAKE 1 TABLET (500 MG TOTAL) BY MOUTH DAILY. 03/13/22   Georganna Skeans, MD  mupirocin ointment (BACTROBAN) 2 % Apply 1 Application topically 3 (three) times daily. 08/15/22   [provider]  neomycin-polymyxin-hydrocortisone (CORTISPORIN) 3.5-10000-1 OTIC suspension Apply 1-2 drops daily after soaking and cover with bandaid 11/30/21   McDonald, Rachelle Hora, DPM  norgestimate-ethinyl estradiol (ORTHO-CYCLEN) 0.25-35 MG-MCG tablet Take 1 tablet by mouth daily. 07/01/21   Constant, Peggy, MD  norgestimate-ethinyl estradiol (ORTHO-CYCLEN) 0.25-35 MG-MCG tablet Take 1 tablet by mouth daily. 07/10/22   Constant, Peggy, MD  nystatin-triamcinolone ointment (MYCOLOG) Apply 1 application  topically 2 (two) times daily. 07/01/21   Constant, Peggy, MD  olopatadine (PATANOL) 0.1 % ophthalmic solution PLACE ONE DROP INTO BOTH EYES 2 TIMES DAILY. 03/24/22   Georganna Skeans, MD  ondansetron (ZOFRAN) 8 MG tablet Take by mouth. 04/04/22   [provider]  ondansetron (ZOFRAN-ODT) 8 MG disintegrating tablet Take 1 tablet (8 mg total) by mouth every 8 (eight) hours as needed for nausea or vomiting. 11/08/21   Ocie Doyne, MD  pantoprazole (PROTONIX) 40 MG tablet Take by mouth. 11/12/19   [provider]  phentermine 15 MG capsule Take 15 mg by mouth every morning. 03/23/22   [provider]  predniSONE (DELTASONE) 10 MG tablet Take by mouth. 07/29/19   [provider]  predniSONE (DELTASONE) 50 MG tablet One tab PO daily for 5 days. 04/19/22   Monica Becton, MD  prochlorperazine (COMPAZINE) 10 MG tablet  09/12/19   [provider]  Rimegepant Sulfate 75 MG TBDP Take 75 mg by mouth as needed (migraine). Max dose 1 pill in 24 hours 11/07/21   Ocie Doyne, MD  rizatriptan (MAXALT-MLT) 10 MG  disintegrating tablet Take 1 tablet (10 mg total) by mouth as needed for migraine. May repeat in 2 hours if needed. Max dose 2 pills in 24 hours 11/15/21   Ocie Doyne, MD  sucralfate (CARAFATE) 1 g tablet Take by mouth. 12/30/21   [provider]  SUMAtriptan (IMITREX) 100 MG tablet Take 100 mg by mouth as directed. 01/12/20   [provider]  tiZANidine (ZANAFLEX) 4 MG tablet  06/23/21   [provider]  traMADol (ULTRAM) 50 MG tablet Take 50 mg by mouth 2 (two) times daily as needed.    [provider]  traZODone (DESYREL) 50 MG tablet Take 50 mg by mouth at bedtime as needed. 04/26/22   [provider]  TRULANCE 3 MG TABS Take by mouth.    [provider]      Allergies    Lanolin, Penicillins, Almond oil, Ca phosphate-cholecalciferol, Cheese, Guaifenesin, Methocarbamol, Naproxen, Omeprazole, Other, Peanut (diagnostic), Peanut allergen powder-dnfp, Peanut-containing drug products, Pork allergy, Pork-derived products, Topiramate, Clindamycin/lincomycin, Oxycodone, and Tylenol [acetaminophen]    Review of Systems   Review of  Systems  All other systems reviewed and are negative.   Physical Exam Updated Vital Signs BP (!) 149/96 (BP Location: Right Arm)   Pulse 88   Temp 98.7 F (37.1 C) (Oral)   Resp 17   SpO2 100%  Physical Exam Vitals and nursing note reviewed.  Constitutional:      Appearance: She is well-developed.  HENT:     Head: Atraumatic.  Eyes:     Extraocular Movements: Extraocular movements intact.     Pupils: Pupils are equal, round, and reactive to light.  Neck:     Comments: No meningismus Cardiovascular:     Rate and Rhythm: Normal rate.  Pulmonary:     Effort: Pulmonary effort is normal.  Abdominal:     Tenderness: There is abdominal tenderness. There is no guarding or rebound.  Musculoskeletal:     Cervical back: Normal range of motion and neck supple.  Skin:    General: Skin is warm and dry.   Neurological:     Mental Status: She is alert and oriented to person, place, and time.     ED Results / Procedures / Treatments   Labs (all labs ordered are listed, but only abnormal results are displayed) Labs Reviewed  COMPREHENSIVE METABOLIC PANEL - Abnormal; Notable for the following components:      Result Value   Calcium 8.7 (*)    Albumin 3.1 (*)    Total Bilirubin 0.2 (*)    All other components within normal limits  CBC - Abnormal; Notable for the following components:   MCH 25.8 (*)    RDW 15.7 (*)    All other components within normal limits  URINALYSIS, ROUTINE W REFLEX MICROSCOPIC - Abnormal; Notable for the following components:   Color, Urine STRAW (*)    Specific Gravity, Urine 1.004 (*)    All other components within normal limits  LIPASE, BLOOD  HCG, SERUM, QUALITATIVE    EKG None  Radiology CT ABDOMEN PELVIS WO CONTRAST  Result Date: 09/14/2022 CLINICAL DATA:  Abdominal pain and fatigue.  Dark stools. EXAM: CT ABDOMEN AND PELVIS WITHOUT CONTRAST TECHNIQUE: Multidetector CT imaging of the abdomen and pelvis was performed following the standard protocol without IV contrast. RADIATION DOSE REDUCTION: This exam was performed according to the departmental dose-optimization program which includes automated exposure control, adjustment of the mA and/or kV according to patient size and/or use of iterative reconstruction technique. COMPARISON:  CT abdomen pelvis dated March 19, 2019. FINDINGS: Lower chest: No acute abnormality. Hepatobiliary: Mild diffusely decreased liver density with unchanged more focal fat along the falciform ligament. Normal gallbladder. No biliary dilatation. Pancreas: Unremarkable. No pancreatic ductal dilatation or surrounding inflammatory changes. Spleen: Normal in size without focal abnormality. Adrenals/Urinary Tract: Adrenal glands are unremarkable. Kidneys are normal, without renal calculi, focal lesion, or hydronephrosis. Bladder is  unremarkable. Stomach/Bowel: Stomach is within normal limits. Appendix appears normal. No evidence of bowel wall thickening, distention, or inflammatory changes. Vascular/Lymphatic: No significant vascular findings are present. No enlarged abdominal or pelvic lymph nodes. Reproductive: Uterus and bilateral adnexa are unremarkable. Other: Tiny fat containing umbilical hernia. No free fluid or pneumoperitoneum. Musculoskeletal: No acute or significant osseous findings. IMPRESSION: 1. No acute intra-abdominal process. 2. Mild hepatic steatosis. Electronically Signed   By: Obie Dredge M.D.   On: 09/14/2022 13:21    Procedures Procedures    Medications Ordered in ED Medications  prochlorperazine (COMPAZINE) injection 10 mg (10 mg Intramuscular Given 09/14/22 1116)    ED Course/ Medical Decision  Making/ A&P                                 Medical Decision Making Amount and/or Complexity of Data Reviewed Labs: ordered. Radiology: ordered.  Risk Prescription drug management.   42 year old patient comes in with chief complaint of abdominal pain, dark stools, fatigue.  She also mentions her chronic daily headaches as part of her complaint, but admits that this is nothing new and where she would not have come to the ER for it.  For the nature of this visit, we have focused primarily on patient's abdominal pain issue.  On exam patient has generalized abdominal tenderness, without rebound or guarding.  She has had 2 bowel movements today, 1 with dark stool, 1 with normal stool.  They were both loose.  She has nausea without vomiting.  No history of abdominal surgery and no UTI-like symptoms.  Differential diagnosis considered for this includes colitis, lower GI bleed, diverticulitis, diverticular bleed.  Plan is to get basic labs, CT abdomen and pelvis ordered.  For the headache, I have reviewed care everywhere.  Patient follows up with outpatient neurology and it appears that she has  mentioned chronic daily headaches to them as well.  Although I considered abdominal migraine is a possibility, I think it is less likely in this patient with established and has stable migraine history.  I have also considered dural thrombosis, IIH in the differential.  Patient's headache is not positional.  She has no clotting risk factors.  Patient has however declined IV placement and therefore the CT scan of the abdomen will be without contrast and we will not get CT venogram.  Alternatively, patient had an MRI brain with and without contrast done in March of this year which was normal and reassuring.  Will give her IM Compazine and we have recommended continued follow-up up with her neurologist for additional workup including LP if needed.   2:26 PM Feels better. Stable for d/c.  The patient appears reasonably screened and/or stabilized for discharge and I doubt any other medical condition or other [facility] requiring further screening, evaluation, or treatment in the ED at this time prior to discharge.   Results from the ER workup discussed with the patient face to face and all questions answered to the best of my ability. The patient is safe for discharge with strict return precautions.   Final Clinical Impression(s) / ED Diagnoses Final diagnoses:  Generalized abdominal pain  Loose stools  Chronic migraine without aura with status migrainosus, not intractable    Rx / DC Orders ED Discharge Orders     None         Derwood Kaplan, MD 09/14/22 1427

## 2022-09-14 NOTE — ED Notes (Signed)
Phlebotomy at bedside.

## 2022-09-18 DIAGNOSIS — J358 Other chronic diseases of tonsils and adenoids: Secondary | ICD-10-CM | POA: Diagnosis not present

## 2022-09-18 DIAGNOSIS — E559 Vitamin D deficiency, unspecified: Secondary | ICD-10-CM | POA: Diagnosis not present

## 2022-09-18 DIAGNOSIS — R233 Spontaneous ecchymoses: Secondary | ICD-10-CM | POA: Diagnosis not present

## 2022-09-18 DIAGNOSIS — D539 Nutritional anemia, unspecified: Secondary | ICD-10-CM | POA: Diagnosis not present

## 2022-09-18 DIAGNOSIS — Z79899 Other long term (current) drug therapy: Secondary | ICD-10-CM | POA: Diagnosis not present

## 2022-09-18 DIAGNOSIS — R194 Change in bowel habit: Secondary | ICD-10-CM | POA: Diagnosis not present

## 2022-09-18 DIAGNOSIS — Z13220 Encounter for screening for lipoid disorders: Secondary | ICD-10-CM | POA: Diagnosis not present

## 2022-09-18 DIAGNOSIS — Z131 Encounter for screening for diabetes mellitus: Secondary | ICD-10-CM | POA: Diagnosis not present

## 2022-09-18 DIAGNOSIS — R202 Paresthesia of skin: Secondary | ICD-10-CM | POA: Diagnosis not present

## 2022-09-20 DIAGNOSIS — R233 Spontaneous ecchymoses: Secondary | ICD-10-CM | POA: Diagnosis not present

## 2022-09-21 ENCOUNTER — Other Ambulatory Visit: Payer: Self-pay | Admitting: Obstetrics and Gynecology

## 2022-09-21 ENCOUNTER — Encounter: Payer: Self-pay | Admitting: Obstetrics and Gynecology

## 2022-09-21 MED ORDER — MUPIROCIN 2 % EX OINT: 1.0000 | TOPICAL_OINTMENT | Freq: Three times a day (TID) | CUTANEOUS | 3 refills | Status: AC

## 2022-09-21 MED ORDER — NORETHINDRONE 0.35 MG PO TABS: 1.0000 | ORAL_TABLET | Freq: Every day | ORAL | 11 refills | Status: DC

## 2022-09-21 MED ORDER — NYSTATIN-TRIAMCINOLONE 100000-0.1 UNIT/GM-% EX OINT: 1.0000 | TOPICAL_OINTMENT | Freq: Two times a day (BID) | CUTANEOUS | 3 refills | Status: AC

## 2022-09-28 ENCOUNTER — Other Ambulatory Visit (INDEPENDENT_AMBULATORY_CARE_PROVIDER_SITE_OTHER): Payer: Medicaid Other

## 2022-09-28 ENCOUNTER — Ambulatory Visit (INDEPENDENT_AMBULATORY_CARE_PROVIDER_SITE_OTHER): Payer: Medicaid Other | Admitting: Sports Medicine

## 2022-09-28 DIAGNOSIS — G8929 Other chronic pain: Secondary | ICD-10-CM | POA: Diagnosis not present

## 2022-09-28 DIAGNOSIS — M25551 Pain in right hip: Secondary | ICD-10-CM | POA: Diagnosis not present

## 2022-09-28 NOTE — Assessment & Plan Note (Signed)
Theresa Phillips returns, she has chronic right anterior/lateral hip pain, she does also localize pain over the greater trochanteric bursa. She has had aggressive physical therapy, hip abductor strength improved but still had discomfort. MRI showed hip osteoarthritis with effusion with also trochanteric bursitis and abductor tendinitis. As her pain at the last visit was anterior and lateral we did a hip joint injection, she had partial relief, then return of pain, due to persistence of pain more in the lateral and posterior aspect we did a greater trochanteric bursa/hip abductor injection today with ultrasound guidance, return to see me in 6 weeks, if still with pain then I really do not have anything else to offer her.

## 2022-09-28 NOTE — Progress Notes (Signed)
    Procedures performed today:    Procedure: Real-time Ultrasound Guided injection of the right greater trochanteric bursa/hip abductor tendon sheath Device: Samsung HS60  Verbal informed consent obtained.  Time-out conducted.  Noted no overlying erythema, induration, or other signs of local infection.  Skin prepped in a sterile fashion.  Local anesthesia: Topical Ethyl chloride.  With sterile technique and under real time ultrasound guidance: Noted mild bursitis, 1 cc Kenalog 40, 2 cc lidocaine, 2 cc bupivacaine injected easily Completed without difficulty  Advised to call if fevers/chills, erythema, induration, drainage, or persistent bleeding.  Images permanently stored and available for review in PACS.  Impression: Technically successful ultrasound guided injection.  Independent interpretation of notes and tests performed by another provider:   None.  Brief History, Exam, Impression, and Recommendations:    Primary osteoarthritis of right hip, right hip trochanteric bursitis and abductor tendinitis Theresa Phillips returns, she has chronic right anterior/lateral hip pain, she does also localize pain over the greater trochanteric bursa. She has had aggressive physical therapy, hip abductor strength improved but still had discomfort. MRI showed hip osteoarthritis with effusion with also trochanteric bursitis and abductor tendinitis. As her pain at the last visit was anterior and lateral we did a hip joint injection, she had partial relief, then return of pain, due to persistence of pain more in the lateral and posterior aspect we did a greater trochanteric bursa/hip abductor injection today with ultrasound guidance, return to see me in 6 weeks, if still with pain then I really do not have anything else to offer her.    ____________________________________________ Ihor Austin. Benjamin Stain, M.D., ABFM., CAQSM., AME. Primary Care and Sports Medicine [facility]  Adjunct  Professor of Family Medicine  [employer]  Restaurant manager, fast food

## 2022-09-29 DIAGNOSIS — R569 Unspecified convulsions: Secondary | ICD-10-CM | POA: Insufficient documentation

## 2022-09-29 DIAGNOSIS — G4486 Cervicogenic headache: Secondary | ICD-10-CM | POA: Diagnosis not present

## 2022-09-29 DIAGNOSIS — G43719 Chronic migraine without aura, intractable, without status migrainosus: Secondary | ICD-10-CM | POA: Diagnosis not present

## 2022-09-29 DIAGNOSIS — G4733 Obstructive sleep apnea (adult) (pediatric): Secondary | ICD-10-CM | POA: Diagnosis not present

## 2022-09-29 DIAGNOSIS — G8929 Other chronic pain: Secondary | ICD-10-CM | POA: Diagnosis not present

## 2022-09-29 DIAGNOSIS — G4440 Drug-induced headache, not elsewhere classified, not intractable: Secondary | ICD-10-CM | POA: Diagnosis not present

## 2022-09-29 DIAGNOSIS — M25551 Pain in right hip: Secondary | ICD-10-CM | POA: Diagnosis not present

## 2022-09-29 DIAGNOSIS — S0300XA Dislocation of jaw, unspecified side, initial encounter: Secondary | ICD-10-CM | POA: Diagnosis not present

## 2022-09-29 MED ORDER — TRIAMCINOLONE ACETONIDE 40 MG/ML IJ SUSP
40.0000 mg | Freq: Once | INTRAMUSCULAR | Status: AC
Start: 2022-09-29 — End: 2022-09-29
  Administered 2022-09-29: 40 mg via INTRAMUSCULAR

## 2022-09-29 NOTE — Addendum Note (Signed)
Addended by: Carren Rang A on: 09/29/2022 01:24 PM   Modules accepted: Orders

## 2022-10-04 DIAGNOSIS — M542 Cervicalgia: Secondary | ICD-10-CM | POA: Diagnosis not present

## 2022-10-04 DIAGNOSIS — Z79899 Other long term (current) drug therapy: Secondary | ICD-10-CM | POA: Diagnosis not present

## 2022-10-04 DIAGNOSIS — R030 Elevated blood-pressure reading, without diagnosis of hypertension: Secondary | ICD-10-CM | POA: Diagnosis not present

## 2022-10-04 DIAGNOSIS — R635 Abnormal weight gain: Secondary | ICD-10-CM | POA: Diagnosis not present

## 2022-10-17 ENCOUNTER — Other Ambulatory Visit: Payer: Self-pay

## 2022-10-17 DIAGNOSIS — H5711 Ocular pain, right eye: Secondary | ICD-10-CM | POA: Diagnosis not present

## 2022-10-17 MED ORDER — CLOTRIMAZOLE-BETAMETHASONE 1-0.05 % EX CREA: TOPICAL_CREAM | Freq: Two times a day (BID) | CUTANEOUS | 3 refills | Status: DC

## 2022-10-18 ENCOUNTER — Other Ambulatory Visit: Payer: Self-pay

## 2022-10-18 MED ORDER — CLOTRIMAZOLE-BETAMETHASONE 1-0.05 % EX CREA: TOPICAL_CREAM | Freq: Two times a day (BID) | CUTANEOUS | 3 refills | Status: DC

## 2022-10-20 DIAGNOSIS — H5213 Myopia, bilateral: Secondary | ICD-10-CM | POA: Diagnosis not present

## 2022-11-01 DIAGNOSIS — Z5320 Procedure and treatment not carried out because of patient's decision for unspecified reasons: Secondary | ICD-10-CM | POA: Diagnosis not present

## 2022-11-01 DIAGNOSIS — R030 Elevated blood-pressure reading, without diagnosis of hypertension: Secondary | ICD-10-CM | POA: Diagnosis not present

## 2022-11-01 DIAGNOSIS — Z6841 Body Mass Index (BMI) 40.0 and over, adult: Secondary | ICD-10-CM | POA: Diagnosis not present

## 2022-11-01 DIAGNOSIS — M542 Cervicalgia: Secondary | ICD-10-CM | POA: Diagnosis not present

## 2022-11-01 DIAGNOSIS — Z79899 Other long term (current) drug therapy: Secondary | ICD-10-CM | POA: Diagnosis not present

## 2022-11-01 DIAGNOSIS — R7303 Prediabetes: Secondary | ICD-10-CM | POA: Diagnosis not present

## 2022-11-01 DIAGNOSIS — E7800 Pure hypercholesterolemia, unspecified: Secondary | ICD-10-CM | POA: Diagnosis not present

## 2022-11-09 ENCOUNTER — Ambulatory Visit (INDEPENDENT_AMBULATORY_CARE_PROVIDER_SITE_OTHER): Payer: Medicaid Other | Admitting: Sports Medicine

## 2022-11-09 ENCOUNTER — Other Ambulatory Visit: Payer: Self-pay | Admitting: Sports Medicine

## 2022-11-09 ENCOUNTER — Encounter: Payer: Self-pay | Admitting: Sports Medicine

## 2022-11-09 ENCOUNTER — Ambulatory Visit: Payer: Commercial Managed Care - HMO | Admitting: Adult Health

## 2022-11-09 DIAGNOSIS — M797 Fibromyalgia: Secondary | ICD-10-CM | POA: Diagnosis not present

## 2022-11-09 DIAGNOSIS — M25551 Pain in right hip: Secondary | ICD-10-CM | POA: Diagnosis not present

## 2022-11-09 DIAGNOSIS — M542 Cervicalgia: Secondary | ICD-10-CM | POA: Diagnosis not present

## 2022-11-09 DIAGNOSIS — G8929 Other chronic pain: Secondary | ICD-10-CM | POA: Diagnosis not present

## 2022-11-09 DIAGNOSIS — M5459 Other low back pain: Secondary | ICD-10-CM | POA: Diagnosis not present

## 2022-11-09 MED ORDER — IBUPROFEN 800 MG PO TABS
800.0000 mg | ORAL_TABLET | Freq: Three times a day (TID) | ORAL | 2 refills | Status: DC | PRN
Start: 2022-11-09 — End: 2023-12-14

## 2022-11-09 MED ORDER — SAVELLA 25 MG PO TABS
ORAL_TABLET | ORAL | 3 refills | Status: DC
Start: 2022-11-09 — End: 2023-03-15

## 2022-11-09 NOTE — Assessment & Plan Note (Addendum)
Gredmarie has a complex process, she has widespread aches and pains, paracervical and shoulder girdle, paralumbar and hip girdle. Her cervical spinal MRI was normal, she did not respond fully to gabapentin, trigger point injections were somewhat efficacious. She continues with tramadol and amitriptyline prescribed by an outside provider. She has not responded to duloxetine in the past. I did suggest trying Savella. I also explained to her that pain was often multifactorial and could have a degenerative component as well as a myofascial component. She understands that injections are meant to treat mostly the degenerative component and residual pain likely represented myofascial/fibromyalgia mediated pain, so we will be adding Savella. Return after 6 weeks of Savella treatment. If the insurance company does not approve the slow upward titration we can try the usual starter convenience pack. We will start somewhat of an off label extremely slow titration.

## 2022-11-09 NOTE — Progress Notes (Signed)
    Procedures performed today:    None.  Independent interpretation of notes and tests performed by another provider:   None.  Brief History, Exam, Impression, and Recommendations:    Chronic neck pain Known chronic neck pain, typically right trapezius over the shoulder, she had a negative cervical spine MRI in 2023. Gabapentin 800 mg 3 times daily resulted in persistent pain. We did trigger point injections in July and she did have some improvement. 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 She will continue gabapentin, amitriptyline, she does get tramadol from an outside provider. I did suggest the addition of Savella as duloxetine was not effective in the past. She can do ibuprofen as needed.  Fibromyalgia Theresa Phillips has a complex process, she has widespread aches and pains, paracervical and shoulder girdle, paralumbar and hip girdle. Her cervical spinal MRI was normal, she did not respond fully to gabapentin, trigger point injections were somewhat efficacious. She continues with tramadol and amitriptyline prescribed by an outside provider. She has not responded to duloxetine in the past. I did suggest trying Savella. I also explained to her that pain was often multifactorial and could have a degenerative component as well as a myofascial component. She understands that injections are meant to treat mostly the degenerative component and residual pain likely represented myofascial/fibromyalgia mediated pain, so we will be adding Savella. Return after 6 weeks of Savella treatment. If the insurance company does not approve the slow upward titration we can try the usual starter convenience pack. We will start somewhat of an off label extremely slow titration.  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 Laurian Brim, continue back pain treatment with Dr.  Laurian Brim.  Primary osteoarthritis of right hip, right hip trochanteric bursitis and abductor tendinitis Theresa Phillips also has chronic right sided hip pain, MRI showed hip osteoarthritis as well as trochanteric bursitis and abductor tendinitis. Hip joint injection did not provide good relief, we then did a hip abductor tendon sheath as well as greater trochanteric bursa injection with ultrasound guidance that resulted in mild relief. I explained to her that the bursitis/tendinitis component of her pain is managed with the injections and physical therapy, and that residual pain was likely related to the fibromyalgia/myofascial component. As above adding ibuprofen for as needed use and Savella for the fibromyalgia as she has failed duloxetine in the past. She does understand I have nothing else to offer her from an injection standpoint.    ____________________________________________ Ihor Austin. Benjamin Stain, M.D., ABFM., CAQSM., AME. Primary Care and Sports Medicine [facility]  Adjunct Professor of Family Medicine  [place] [employer]  Restaurant manager, fast food

## 2022-11-09 NOTE — Assessment & Plan Note (Signed)
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 Laurian Brim, continue back pain treatment with Dr. Laurian Brim.

## 2022-11-09 NOTE — Assessment & Plan Note (Addendum)
Arminda also has chronic right sided hip pain, MRI showed hip osteoarthritis as well as trochanteric bursitis and abductor tendinitis. Hip joint injection did not provide good relief, we then did a hip abductor tendon sheath as well as greater trochanteric bursa injection with ultrasound guidance that resulted in mild relief. I explained to her that the bursitis/tendinitis component of her pain is managed with the injections and physical therapy, and that residual pain was likely related to the fibromyalgia/myofascial component. As above adding ibuprofen for as needed use and Savella for the fibromyalgia as she has failed duloxetine in the past. She does understand I have nothing else to offer her from an injection standpoint.

## 2022-11-09 NOTE — Assessment & Plan Note (Signed)
Known chronic neck pain, typically right trapezius over the shoulder, she had a negative cervical spine MRI in 2023. Gabapentin 800 mg 3 times daily resulted in persistent pain. We did trigger point injections in July and she did have some improvement. 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 She will continue gabapentin, amitriptyline, she does get tramadol from an outside provider. I did suggest the addition of Savella as duloxetine was not effective in the past. She can do ibuprofen as needed.

## 2022-11-13 ENCOUNTER — Other Ambulatory Visit (HOSPITAL_COMMUNITY): Payer: Self-pay

## 2022-11-21 DIAGNOSIS — G43719 Chronic migraine without aura, intractable, without status migrainosus: Secondary | ICD-10-CM | POA: Diagnosis not present

## 2022-12-04 DIAGNOSIS — G43E01 Chronic migraine with aura, not intractable, with status migrainosus: Secondary | ICD-10-CM | POA: Diagnosis not present

## 2022-12-04 DIAGNOSIS — R7303 Prediabetes: Secondary | ICD-10-CM | POA: Diagnosis not present

## 2022-12-04 DIAGNOSIS — Z6841 Body Mass Index (BMI) 40.0 and over, adult: Secondary | ICD-10-CM | POA: Diagnosis not present

## 2022-12-04 DIAGNOSIS — Z5320 Procedure and treatment not carried out because of patient's decision for unspecified reasons: Secondary | ICD-10-CM | POA: Diagnosis not present

## 2022-12-04 DIAGNOSIS — M542 Cervicalgia: Secondary | ICD-10-CM | POA: Diagnosis not present

## 2022-12-04 DIAGNOSIS — E7800 Pure hypercholesterolemia, unspecified: Secondary | ICD-10-CM | POA: Diagnosis not present

## 2022-12-04 DIAGNOSIS — Z79899 Other long term (current) drug therapy: Secondary | ICD-10-CM | POA: Diagnosis not present

## 2022-12-27 DIAGNOSIS — E7800 Pure hypercholesterolemia, unspecified: Secondary | ICD-10-CM | POA: Diagnosis not present

## 2022-12-27 DIAGNOSIS — G43E01 Chronic migraine with aura, not intractable, with status migrainosus: Secondary | ICD-10-CM | POA: Diagnosis not present

## 2022-12-27 DIAGNOSIS — M25562 Pain in left knee: Secondary | ICD-10-CM | POA: Diagnosis not present

## 2022-12-27 DIAGNOSIS — M25561 Pain in right knee: Secondary | ICD-10-CM | POA: Diagnosis not present

## 2022-12-27 DIAGNOSIS — M542 Cervicalgia: Secondary | ICD-10-CM | POA: Diagnosis not present

## 2022-12-27 DIAGNOSIS — R030 Elevated blood-pressure reading, without diagnosis of hypertension: Secondary | ICD-10-CM | POA: Diagnosis not present

## 2022-12-27 DIAGNOSIS — Z6841 Body Mass Index (BMI) 40.0 and over, adult: Secondary | ICD-10-CM | POA: Diagnosis not present

## 2022-12-27 DIAGNOSIS — Z5320 Procedure and treatment not carried out because of patient's decision for unspecified reasons: Secondary | ICD-10-CM | POA: Diagnosis not present

## 2022-12-27 DIAGNOSIS — Z79899 Other long term (current) drug therapy: Secondary | ICD-10-CM | POA: Diagnosis not present

## 2022-12-27 DIAGNOSIS — R7303 Prediabetes: Secondary | ICD-10-CM | POA: Diagnosis not present

## 2022-12-28 DIAGNOSIS — Z6841 Body Mass Index (BMI) 40.0 and over, adult: Secondary | ICD-10-CM | POA: Diagnosis not present

## 2022-12-28 DIAGNOSIS — R7303 Prediabetes: Secondary | ICD-10-CM | POA: Diagnosis not present

## 2022-12-28 DIAGNOSIS — Z79899 Other long term (current) drug therapy: Secondary | ICD-10-CM | POA: Diagnosis not present

## 2022-12-28 DIAGNOSIS — E7800 Pure hypercholesterolemia, unspecified: Secondary | ICD-10-CM | POA: Diagnosis not present

## 2023-01-02 DIAGNOSIS — G43719 Chronic migraine without aura, intractable, without status migrainosus: Secondary | ICD-10-CM | POA: Diagnosis not present

## 2023-01-24 DIAGNOSIS — R519 Headache, unspecified: Secondary | ICD-10-CM | POA: Diagnosis not present

## 2023-01-25 ENCOUNTER — Encounter: Payer: Self-pay | Admitting: Emergency Medicine

## 2023-01-25 ENCOUNTER — Emergency Department
Admission: EM | Admit: 2023-01-25 | Discharge: 2023-01-25 | Disposition: A | Discharge status: Home / Self Care | Payer: Medicaid Other | Attending: Emergency Medicine | Admitting: Emergency Medicine

## 2023-01-25 ENCOUNTER — Other Ambulatory Visit: Payer: Self-pay

## 2023-01-25 DIAGNOSIS — G43709 Chronic migraine without aura, not intractable, without status migrainosus: Secondary | ICD-10-CM | POA: Diagnosis not present

## 2023-01-25 DIAGNOSIS — G43909 Migraine, unspecified, not intractable, without status migrainosus: Secondary | ICD-10-CM | POA: Diagnosis present

## 2023-01-25 LAB — CBC WITH DIFFERENTIAL/PLATELET
Abs Immature Granulocytes: 0.01 10*3/uL (ref 0.00–0.07)
Basophils Absolute: 0 10*3/uL (ref 0.0–0.1)
Basophils Relative: 0 %
Eosinophils Absolute: 0 10*3/uL (ref 0.0–0.5)
Eosinophils Relative: 0 %
HCT: 36.6 % (ref 36.0–46.0)
Hemoglobin: 12.4 g/dL (ref 12.0–15.0)
Immature Granulocytes: 0 %
Lymphocytes Relative: 28 %
Lymphs Abs: 1.3 10*3/uL (ref 0.7–4.0)
MCH: 27.6 pg (ref 26.0–34.0)
MCHC: 33.9 g/dL (ref 30.0–36.0)
MCV: 81.3 fL (ref 80.0–100.0)
Monocytes Absolute: 0.6 10*3/uL (ref 0.1–1.0)
Monocytes Relative: 12 %
Neutro Abs: 2.8 10*3/uL (ref 1.7–7.7)
Neutrophils Relative %: 60 %
Platelets: 281 10*3/uL (ref 150–400)
RBC: 4.5 MIL/uL (ref 3.87–5.11)
RDW: 14.6 % (ref 11.5–15.5)
WBC: 4.7 10*3/uL (ref 4.0–10.5)
nRBC: 0 % (ref 0.0–0.2)

## 2023-01-25 LAB — BASIC METABOLIC PANEL
Anion gap: 13 (ref 5–15)
BUN: 7 mg/dL (ref 6–20)
CO2: 24 mmol/L (ref 22–32)
Calcium: 8.7 mg/dL — ABNORMAL LOW (ref 8.9–10.3)
Chloride: 100 mmol/L (ref 98–111)
Creatinine, Ser: 0.8 mg/dL (ref 0.44–1.00)
GFR, Estimated: >60 mL/min (ref >60)
Glucose, Bld: 91 mg/dL (ref 70–99)
Potassium: 3.1 mmol/L — ABNORMAL LOW (ref 3.5–5.1)
Sodium: 137 mmol/L (ref 135–145)

## 2023-01-25 MED ORDER — KETOROLAC TROMETHAMINE 15 MG/ML IJ SOLN
15.0000 mg | Freq: Once | INTRAMUSCULAR | Status: AC
  Administered 2023-01-25: 15 mg via INTRAMUSCULAR
  Filled 2023-01-25: qty 1

## 2023-01-25 MED ORDER — METOCLOPRAMIDE HCL 5 MG/ML IJ SOLN
10.0000 mg | Freq: Once | INTRAMUSCULAR | Status: AC
  Administered 2023-01-25: 10 mg via INTRAVENOUS
  Filled 2023-01-25: qty 2

## 2023-01-25 MED ORDER — SODIUM CHLORIDE 0.9 % IV BOLUS
1000.0000 mL | Freq: Once | INTRAVENOUS | Status: AC
  Administered 2023-01-25: 1000 mL via INTRAVENOUS

## 2023-01-25 NOTE — ED Triage Notes (Signed)
 Patient to ED via POV for migraine x4 days. PT reports light sensitivity and N/V. PT states she is having pain in face, mouth, and ears.

## 2023-01-25 NOTE — ED Notes (Signed)
 See triage notes. Patient c/o chronic migraines since 2020. Patient states she is having additional pain on top of the normal migraine

## 2023-01-25 NOTE — ED Provider Notes (Signed)
 [facility] Provider Note    Event Date/Time   First MD Initiated Contact with Patient 01/25/23 1107     (approximate)   History   Migraine   HPI  Theresa Phillips is a 43 y.o. female with a past medical history of migraine headaches, fibromyalgia, chronic pain, trigeminal nerve disorder who presents today for evaluation of migraine headache.  Patient reports that her migraine headache feels the same as it always does but slightly worse over the past 4 days.  She reports that she has had a constant headache since February 2021, and her migraine headaches began when she was 43 years old.  She has seen a neurologist, is on multiple maintenance medications, reports that she has had several imaging modalities done.  She denies any new features to her headaches today.  She reports that she has light sensitivity, nausea, vomiting, and pain behind her eyes which is consistent with her previous migraine headaches.  She denies trauma.  She denies numbness, tingling, weakness.  She is requesting a migraine cocktail.  Patient Active Problem List   Diagnosis Date Noted   Fibromyalgia 11/09/2022   Chronic neck pain 04/19/2022   Primary osteoarthritis of right hip, right hip trochanteric bursitis and abductor tendinitis 04/19/2022   Patellofemoral syndrome, bilateral 04/19/2022   Bilateral ankle pain 04/19/2022   Sensory disorder of trigeminal nerve 01/19/2021   Recurrent boils 12/01/2020   Chronic nausea 05/13/2020   Acanthosis nigricans 03/05/2020   Constipation 03/05/2020   Irritable bowel syndrome 03/05/2020   Sciatica 03/05/2020   Facet arthropathy, lumbar 02/17/2020   Chronic migraine without aura, intractable, with status migrainosus 12/10/2019   Chronic daily headache 11/03/2019   Chronic pain syndrome 10/30/2019   Lumbar degenerative disc disease 10/30/2019   Lumbar facet joint pain 10/30/2019   Lumbar radiculopathy 10/30/2019   Carpal tunnel syndrome of left  wrist 04/04/2019   Carpal tunnel syndrome of right wrist 04/04/2019   Migraine 03/20/2019   Upper airway cough syndrome/ PNDS ? allergic rhinitis  03/01/2019   OSA on CPAP 03/01/2019   DOE (dyspnea on exertion) 02/28/2019   Tremor 02/26/2019   Bilateral carpal tunnel syndrome 02/13/2019   Radial styloid tenosynovitis of left hand 02/13/2019   Radial styloid tenosynovitis of right hand 02/13/2019   Obesity, Class III, BMI 40-49.9 (morbid obesity) (HCC) 04/07/2018   Morbid obesity with BMI of 45.0-49.9, adult (HCC) 04/07/2018   Cognitive complaints 05/30/2017   PCOS (polycystic ovarian syndrome) 12/09/2015   Numbness and tingling in left hand 01/12/2015   Bilateral lower extremity pain 08/07/2014   Sleep related rhythmic movement disorder 07/13/2014   Acute pain of right shoulder 06/29/2014   Midline low back pain without sciatica 06/29/2014   Multiple joint pain 06/29/2014   Chronic pain of both knees 06/29/2014   Orthostasis 11/27/2013   Gastritis, chronic 08/12/2013   Allergic rhinitis 01/27/2013   Vitamin D  deficiency 11/27/2012   Chest pain 09/12/2012          Physical Exam   Triage Vital Signs: ED Triage Vitals  Encounter Vitals Group     BP 01/25/23 1018 (!) 144/103     Systolic BP Percentile --      Diastolic BP Percentile --      Pulse Rate 01/25/23 1018 100     Resp 01/25/23 1018 18     Temp 01/25/23 1018 98.4 F (36.9 C)     Temp Source 01/25/23 1018 Oral     SpO2 01/25/23 1018 100 %  Weight 01/25/23 1016 262 lb (118.8 kg)     Height 01/25/23 1016 5' 5 (1.651 m)     Head Circumference --      Peak Flow --      Pain Score 01/25/23 1016 10     Pain Loc --      Pain Education --      Exclude from Growth Chart --     Most recent vital signs: Vitals:   01/25/23 1018  BP: (!) 144/103  Pulse: 100  Resp: 18  Temp: 98.4 F (36.9 C)  SpO2: 100%    Physical Exam Vitals and nursing note reviewed.  Constitutional:      General: Awake and alert. No  acute distress.  Sitting in a darkened room with sunglasses on    Appearance: Normal appearance. The patient is obese.  HENT:     Head: Normocephalic and atraumatic.     Mouth: Mucous membranes are moist.  Eyes:     General: PERRL. Normal EOMs        Right eye: No discharge.        Left eye: No discharge.     Conjunctiva/sclera: Conjunctivae normal.  Cardiovascular:     Rate and Rhythm: Normal rate and regular rhythm.     Pulses: Normal pulses.     Heart sounds: Normal heart sounds Pulmonary:     Effort: Pulmonary effort is normal. No respiratory distress.     Breath sounds: Normal breath sounds.  Abdominal:     Abdomen is soft. There is no abdominal tenderness. No rebound or guarding. No distention. Musculoskeletal:        General: No swelling. Normal range of motion.     Cervical back: Normal range of motion and neck supple.  Skin:    General: Skin is warm and dry.     Capillary Refill: Capillary refill takes less than 2 seconds.     Findings: No rash.  Neurological:     Mental Status: The patient is awake and alert.   Neurological: GCS 15 alert and oriented x3 Normal speech, no expressive or receptive aphasia or dysarthria Cranial nerves II through XII intact Normal visual fields 5 out of 5 strength in all 4 extremities with intact sensation throughout No extremity drift Normal finger-to-nose testing, no limb or truncal ataxia    ED Results / Procedures / Treatments   Labs (all labs ordered are listed, but only abnormal results are displayed) Labs Reviewed  BASIC METABOLIC PANEL - Abnormal; Notable for the following components:      Result Value   Potassium 3.1 (*)    Calcium 8.7 (*)    All other components within normal limits  CBC WITH DIFFERENTIAL/PLATELET     EKG     RADIOLOGY     PROCEDURES:  Critical Care performed:   Procedures   MEDICATIONS ORDERED IN ED: Medications  ketorolac  (TORADOL ) 15 MG/ML injection 15 mg (15 mg Intramuscular  Given 01/25/23 1232)  metoCLOPramide  (REGLAN ) injection 10 mg (10 mg Intravenous Given 01/25/23 1231)  sodium chloride  0.9 % bolus 1,000 mL (0 mLs Intravenous Stopped 01/25/23 1337)     IMPRESSION / MDM / ASSESSMENT AND PLAN / ED COURSE  I reviewed the triage vital signs and the nursing notes.   Differential diagnosis includes, but is not limited to, migraine headache, trigeminal neuralgia, tension headache, cluster headache.  Patient is awake and alert, hemodynamically stable and afebrile.  She has no focal neurological deficits.  She reports that she  has a long history of migraines and today's headache feels the same as her previous migraine headaches.  I reviewed the patient's chart.  Patient is followed by neurologist Dr. Vaidya.  Patient had an MRI brain with and without contrast earlier this year which was normal and reassuring.  Headache was gradual in onset, without history or physical exam findings to suggest encephalopathy; no altered mental status, fever or meningismus, vision changes, vomiting or focal neurological deficit and improved/resolved with treatment in the emergency department. Therefore, I have low suspicion for concerning process that would require urgent or emergent imaging or diagnostic/therapeutic procedural intervention such as lumbar puncture. Doubt meningitis as there is no fever, photophobia, neck symptoms, altered mental status. Additionally the patient is not known to be immunocompromised. No history of trauma, doubt subdural or epidural hematoma. No dizziness or other neurologic symptoms so cerebellar infarction or other hemorrhagic stroke are unlikely. Intracranial mass unlikely given that the headache is not getting progressively worse, is not worse in the morning, there are no other neurologic symptoms, and the neurologic exam is grossly normal. Unlikely to be giant cell arteritis as there is no tenderness over temporal artery or vision changes. Doubt CO toxicity as no  known exposure and no other family members have a headache. No neck pain and was not sudden onset or associated with movement of the neck and no dizziness,  doubt carotid artery dissection. No occipital tenderness so occipital neuralgia seems less likely. Return precautions discussed, patient to follow-up closely with outpatient provider/her neurologist.  Patient understands and agrees with plan.  She was discharged in stable condition.  Patient's presentation is most consistent with severe exacerbation of chronic illness.   Clinical Course as of 01/25/23 1706  Thu Jan 25, 2023  1314 Patient reports feeling significantly improved and would like to be discharged [JP]    Clinical Course User Index [JP] Rolando Hessling E, PA-C     FINAL CLINICAL IMPRESSION(S) / ED DIAGNOSES   Final diagnoses:  Chronic migraine without aura without status migrainosus, not intractable     Rx / DC Orders   ED Discharge Orders     None        Note:  This document was prepared using Dragon voice recognition software and may include unintentional dictation errors.   Princ[place] Blizzard E, PA-C 01/25/23 1706    Levander Slate, MD 01/26/23 [phone]

## 2023-01-25 NOTE — Discharge Instructions (Signed)
 Please follow-up with your neurologist.  Please return for any new, worsening, or changing symptoms or other concerns.  It was a pleasure caring for you today.

## 2023-02-07 DIAGNOSIS — G43711 Chronic migraine without aura, intractable, with status migrainosus: Secondary | ICD-10-CM | POA: Diagnosis not present

## 2023-02-12 ENCOUNTER — Telehealth: Payer: Medicaid Other | Admitting: Physician Assistant

## 2023-02-12 DIAGNOSIS — J069 Acute upper respiratory infection, unspecified: Secondary | ICD-10-CM

## 2023-02-12 MED ORDER — PROMETHAZINE-DM 6.25-15 MG/5ML PO SYRP
5.0000 mL | ORAL_SOLUTION | Freq: Four times a day (QID) | ORAL | 0 refills | Status: DC | PRN
Start: 2023-02-12 — End: 2023-03-15

## 2023-02-12 MED ORDER — IPRATROPIUM BROMIDE 0.03 % NA SOLN
2.0000 | Freq: Two times a day (BID) | NASAL | 0 refills | Status: AC
Start: 2023-02-12 — End: ?

## 2023-02-12 NOTE — Progress Notes (Signed)

## 2023-03-05 ENCOUNTER — Encounter: Payer: Self-pay | Admitting: Obstetrics and Gynecology

## 2023-03-07 DIAGNOSIS — G43711 Chronic migraine without aura, intractable, with status migrainosus: Secondary | ICD-10-CM | POA: Diagnosis not present

## 2023-03-07 DIAGNOSIS — G4486 Cervicogenic headache: Secondary | ICD-10-CM | POA: Diagnosis not present

## 2023-03-07 DIAGNOSIS — G500 Trigeminal neuralgia: Secondary | ICD-10-CM | POA: Diagnosis not present

## 2023-03-07 DIAGNOSIS — G4733 Obstructive sleep apnea (adult) (pediatric): Secondary | ICD-10-CM | POA: Diagnosis not present

## 2023-03-07 DIAGNOSIS — E663 Overweight: Secondary | ICD-10-CM | POA: Diagnosis not present

## 2023-03-14 ENCOUNTER — Telehealth: Payer: Medicaid Other | Admitting: Family Medicine

## 2023-03-14 ENCOUNTER — Encounter: Payer: Self-pay | Admitting: Family Medicine

## 2023-03-14 ENCOUNTER — Other Ambulatory Visit: Payer: Self-pay | Admitting: Family Medicine

## 2023-03-14 DIAGNOSIS — Z7251 High risk heterosexual behavior: Secondary | ICD-10-CM

## 2023-03-14 DIAGNOSIS — G5601 Carpal tunnel syndrome, right upper limb: Secondary | ICD-10-CM

## 2023-03-14 DIAGNOSIS — G894 Chronic pain syndrome: Secondary | ICD-10-CM | POA: Diagnosis not present

## 2023-03-14 DIAGNOSIS — E282 Polycystic ovarian syndrome: Secondary | ICD-10-CM | POA: Diagnosis not present

## 2023-03-14 DIAGNOSIS — G43711 Chronic migraine without aura, intractable, with status migrainosus: Secondary | ICD-10-CM | POA: Diagnosis not present

## 2023-03-14 DIAGNOSIS — G8929 Other chronic pain: Secondary | ICD-10-CM

## 2023-03-14 DIAGNOSIS — M797 Fibromyalgia: Secondary | ICD-10-CM | POA: Diagnosis not present

## 2023-03-14 DIAGNOSIS — H1013 Acute atopic conjunctivitis, bilateral: Secondary | ICD-10-CM | POA: Insufficient documentation

## 2023-03-14 DIAGNOSIS — J309 Allergic rhinitis, unspecified: Secondary | ICD-10-CM | POA: Insufficient documentation

## 2023-03-14 DIAGNOSIS — R251 Tremor, unspecified: Secondary | ICD-10-CM | POA: Diagnosis not present

## 2023-03-14 DIAGNOSIS — Z862 Personal history of diseases of the blood and blood-forming organs and certain disorders involving the immune mechanism: Secondary | ICD-10-CM

## 2023-03-14 DIAGNOSIS — G4733 Obstructive sleep apnea (adult) (pediatric): Secondary | ICD-10-CM | POA: Diagnosis not present

## 2023-03-14 DIAGNOSIS — M25551 Pain in right hip: Secondary | ICD-10-CM

## 2023-03-14 DIAGNOSIS — R7303 Prediabetes: Secondary | ICD-10-CM

## 2023-03-14 DIAGNOSIS — E559 Vitamin D deficiency, unspecified: Secondary | ICD-10-CM

## 2023-03-14 DIAGNOSIS — M5416 Radiculopathy, lumbar region: Secondary | ICD-10-CM

## 2023-03-14 DIAGNOSIS — M47816 Spondylosis without myelopathy or radiculopathy, lumbar region: Secondary | ICD-10-CM

## 2023-03-14 DIAGNOSIS — G509 Disorder of trigeminal nerve, unspecified: Secondary | ICD-10-CM

## 2023-03-14 DIAGNOSIS — I951 Orthostatic hypotension: Secondary | ICD-10-CM

## 2023-03-14 MED ORDER — AZELASTINE HCL 0.05 % OP SOLN: 2.0000 [drp] | Freq: Two times a day (BID) | OPHTHALMIC | 12 refills | Status: DC

## 2023-03-14 NOTE — Progress Notes (Unsigned)
 New Patient MyChart Video Visit    Virtual Visit via Video Note   This format is felt to be most appropriate for this patient at this time. Physical exam was limited by quality of the video and audio technology used for the visit.   Patient location: Patient's home address   Provider location: [facility]  [address]  [place], [place] [address]   I discussed the limitations of evaluation and management by telemedicine and the availability of in person appointments. The patient expressed understanding and agreed to proceed.  Patient: Theresa Phillips   DOB: September 28, 1980   43 y.o. Female  MRN: [id number] Visit Date: 03/14/2023  Today's healthcare provider: Ronnald Ramp, MD   No chief complaint on file.  Subjective    HPI   Discussed the use of AI scribe software for clinical note transcription with the patient, who gave verbal consent to proceed.  History of Present Illness           Pt is allergic to cromium and prior preferred eye drop has been discontinued ****   Past Medical History:  Diagnosis Date   Allergy    See list   Anxiety 05/2014   Chronic pain syndrome    Clotting disorder (HCC)    Jan/Feb 23   GERD (gastroesophageal reflux disease)    Migraines    Neuromuscular disorder (HCC)    PCOS (polycystic ovarian syndrome)    PTSD (post-traumatic stress disorder)    Seizures (HCC)    Sinusitis    Sleep apnea    7 years ago, CPAP   Vitamin D deficiency     Medications: Outpatient Medications Prior to Visit  Medication Sig   amitriptyline (ELAVIL) 25 MG tablet    betamethasone acetate-betamethasone sodium phosphate (CELESTONE) 6 (3-3) MG/ML injection Inject into the muscle.   botulinum toxin Type A (BOTOX) 200 units injection Inject 155 units into the head and neck muscles every 90 days.   clindamycin (CLINDAGEL) 1 % gel as needed.   clobetasol ointment (TEMOVATE) 0.05 % Apply to affected area every night for 4  weeks, then every other day for 4 weeks and then twice a week for 4 weeks or until resolution.   clotrimazole-betamethasone (LOTRISONE) cream Apply topically 2 (two) times daily.   emollient (BIAFINE) cream Apply topically.   ergocalciferol (VITAMIN D2) 1.25 MG (50000 UT) capsule Take 1 capsule by mouth once a week.   famotidine (PEPCID) 20 MG tablet Take 20 mg by mouth 2 (two) times daily as needed.   Fluticasone Propionate 93 MCG/ACT EXHU Place 1 spray into the nose in the morning and at bedtime.   Fremanezumab-vfrm 225 MG/1.5ML SOSY Inject into the skin.   gabapentin (NEURONTIN) 800 MG tablet Take 1 tablet (800 mg total) by mouth 3 (three) times daily.   HYDROcodone Bitartrate ER (HYSINGLA ER) 20 MG T24A    HYDROcodone-acetaminophen (NORCO/VICODIN) 5-325 MG tablet Take by mouth.   hydrOXYzine (ATARAX) 25 MG tablet Take 25 mg by mouth 2 (two) times daily.   ibuprofen (ADVIL) 800 MG tablet Take 1 tablet (800 mg total) by mouth every 8 (eight) hours as needed.   ipratropium (ATROVENT) 0.03 % nasal spray Place 2 sprays into both nostrils every 12 (twelve) hours.   Iron, Ferrous Sulfate, 325 (65 Fe) MG TABS Take 325 mg by mouth daily.   levocetirizine (XYZAL) 5 MG tablet TAKE 1 TABLET (5 MG TOTAL) BY MOUTH DAILY.   loratadine (CLARITIN) 10 MG tablet Take  1 tablet (10 mg total) by mouth daily.   lubiprostone (AMITIZA) 24 MCG capsule Take by mouth.   metFORMIN (GLUCOPHAGE-XR) 500 MG 24 hr tablet TAKE 1 TABLET (500 MG TOTAL) BY MOUTH DAILY.   Milnacipran HCl (SAVELLA) 25 MG TABS 12.5 mg daily for 1 week then 25 mg daily   mupirocin ointment (BACTROBAN) 2 % Apply 1 Application topically 3 (three) times daily.   neomycin-polymyxin-hydrocortisone (CORTISPORIN) 3.5-10000-1 OTIC suspension Apply 1-2 drops daily after soaking and cover with bandaid   norethindrone (MICRONOR) 0.35 MG tablet Take 1 tablet (0.35 mg total) by mouth daily.   norgestimate-ethinyl estradiol (ORTHO-CYCLEN) 0.25-35 MG-MCG tablet  Take 1 tablet by mouth daily.   norgestimate-ethinyl estradiol (ORTHO-CYCLEN) 0.25-35 MG-MCG tablet Take 1 tablet by mouth daily.   nystatin-triamcinolone ointment (MYCOLOG) Apply 1 Application topically 2 (two) times daily.   olopatadine (PATANOL) 0.1 % ophthalmic solution PLACE ONE DROP INTO BOTH EYES 2 TIMES DAILY.   ondansetron (ZOFRAN) 8 MG tablet Take by mouth.   ondansetron (ZOFRAN-ODT) 8 MG disintegrating tablet Take 1 tablet (8 mg total) by mouth every 8 (eight) hours as needed for nausea or vomiting.   pantoprazole (PROTONIX) 40 MG tablet Take by mouth.   phentermine (ADIPEX-P) 37.5 MG tablet Take 37.5 mg by mouth every morning.   phentermine 15 MG capsule Take 15 mg by mouth every morning.   predniSONE (DELTASONE) 10 MG tablet Take by mouth.   predniSONE (DELTASONE) 50 MG tablet One tab PO daily for 5 days.   prochlorperazine (COMPAZINE) 10 MG tablet    promethazine-dextromethorphan (PROMETHAZINE-DM) 6.25-15 MG/5ML syrup Take 5 mLs by mouth 4 (four) times daily as needed.   Rimegepant Sulfate 75 MG TBDP Take 75 mg by mouth as needed (migraine). Max dose 1 pill in 24 hours   rizatriptan (MAXALT-MLT) 10 MG disintegrating tablet Take 1 tablet (10 mg total) by mouth as needed for migraine. May repeat in 2 hours if needed. Max dose 2 pills in 24 hours   sucralfate (CARAFATE) 1 g tablet Take by mouth.   SUMAtriptan (IMITREX) 100 MG tablet Take 100 mg by mouth as directed.   tiZANidine (ZANAFLEX) 4 MG tablet    traMADol (ULTRAM) 50 MG tablet Take 50 mg by mouth 2 (two) times daily as needed.   traZODone (DESYREL) 50 MG tablet Take 50 mg by mouth at bedtime as needed.   TRULANCE 3 MG TABS Take by mouth.   No facility-administered medications prior to visit.    Review of Systems  {Insert previous labs (optional):23779} {See past labs  Heme  Chem  Endocrine  Serology  Results Review (optional):1}   Objective    There were no vitals taken for this visit.  {Insert last BP/Wt  (optional):23777}{See vitals history (optional):1}    Physical Exam     Assessment & Plan     Problem List Items Addressed This Visit       Cardiovascular and Mediastinum   Orthostasis   Relevant Orders   Comprehensive metabolic panel   [id number]   Chronic migraine without aura, intractable, with status migrainosus - Primary     Respiratory   OSA on CPAP   Allergic rhinitis   Allergic conjunctivitis and rhinitis, bilateral   Relevant Medications   azelastine (OPTIVAR) 0.05 % ophthalmic solution   Other Relevant Orders   Ambulatory referral to Allergy     Endocrine   PCOS (polycystic ovarian syndrome)     Nervous and Auditory   Sensory disorder of trigeminal nerve  Lumbar radiculopathy   Carpal tunnel syndrome of right wrist   Relevant Orders   Ambulatory referral to Pain Clinic     Musculoskeletal and Integument   Facet arthropathy, lumbar     Other   Vitamin D deficiency   Relevant Orders   VITAMIN D 25 Hydroxy (Vit-D Deficiency, Fractures)   Tremor   Primary osteoarthritis of right hip, right hip trochanteric bursitis and abductor tendinitis   Relevant Orders   Ambulatory referral to Pain Clinic   Comprehensive metabolic panel   Obesity, Class III, BMI 40-49.9 (morbid obesity) (HCC)   Fibromyalgia   Chronic pain syndrome   Relevant Orders   Ambulatory referral to Pain Clinic   Comprehensive metabolic panel   Other Visit Diagnoses       Hypocalcemia       Relevant Orders   Comprehensive metabolic panel     Prediabetes       Relevant Orders   Hemoglobin A1c     History of anemia       Relevant Orders   Iron, TIBC and Ferritin Panel                     No follow-ups on file.     I discussed the assessment and treatment plan with the patient. The patient was provided an opportunity to ask questions and all were answered. The patient agreed with the plan and demonstrated an understanding of the instructions.   The patient was  advised to call back or seek an in-person evaluation if the symptoms worsen or if the condition fails to improve as anticipated.  I provided 65 minutes of non-face-to-face time during this encounter.  Total time spent on today's visit was 65 minutes, including both face-to-face time and reviewing outside provder clinic notes***from ***, chart (labs and imaging), discussing labs and goals, discussing further work-up, treatment options, referrals to specialist if needed, reviewing outside records of pertinent, answering patient's questions, and coordinating care.    Ronnald Ramp, MD [facility] [phone] (phone) [phone] (fax)  [facility]

## 2023-03-14 NOTE — Patient Instructions (Signed)
  YOUR PLAN:  -CHRONIC MIGRAINE: Chronic migraines are severe headaches that occur frequently. You should continue taking Nurtec every other day, amitriptyline 50 mg daily, gabapentin 800 mg three times daily, and carbamazepine. Follow up with your neurologist, Dr. Noland Fordyce, or another consistent neurologist after July.  -CHRONIC PAIN: Chronic pain is long-lasting pain that can result from injuries or conditions like arthritis. You should continue taking gabapentin, tramadol, and ibuprofen. You will be referred to a pain management clinic within the [facility] system.  -ALLERGIC RHINITIS: Allergic rhinitis is an allergic reaction that causes sneezing, itching, and watery eyes. You should use azelastine hydrochloride eye drops 0.05% as needed and Flonase nasal spray. You will be referred to allergy and immunology for evaluation and potential allergy shots.  -POLYCYSTIC OVARY SYNDROME (PCOS): PCOS is a hormonal disorder causing irregular menstrual cycles. You should continue taking norethindrone 0.35 mg to regulate your menstrual cycle.  -SLEEP APNEA: Sleep apnea is a condition where breathing repeatedly stops and starts during sleep. You should continue using your CPAP therapy and follow up with your sleep specialist as needed.  -VITAMIN D DEFICIENCY: Vitamin D deficiency means you have lower than normal levels of vitamin D. A vitamin D level test will be ordered to assess your current status.  -GENERAL HEALTH MAINTENANCE: Given your family history of diabetes and past borderline diabetes, a complete metabolic panel, ferritin, iron panel, thyroid function tests, and HbA1c will be ordered to monitor your overall health.  INSTRUCTIONS:  Please schedule a follow-up appointment on March 29, 2023, at 10:40 AM. Additionally, have your labs drawn at [facility] lab prior to the follow-up appointment.  You can visit the lab without an appointment Mon-Fri between 8A-11:30A or 1P-4:30P.

## 2023-03-15 ENCOUNTER — Ambulatory Visit: Payer: Medicaid Other | Admitting: Family Medicine

## 2023-03-15 DIAGNOSIS — M5441 Lumbago with sciatica, right side: Secondary | ICD-10-CM | POA: Insufficient documentation

## 2023-03-15 MED ORDER — AMITRIPTYLINE HCL 50 MG PO TABS
50.0000 mg | ORAL_TABLET | Freq: Every day | ORAL | Status: AC
Start: 2023-03-15 — End: ?

## 2023-03-15 NOTE — Telephone Encounter (Signed)
 Pharmacy comment: Alternative Requested:NOT COVERED.

## 2023-03-20 ENCOUNTER — Other Ambulatory Visit: Payer: Self-pay | Admitting: Family Medicine

## 2023-03-20 ENCOUNTER — Encounter: Payer: Self-pay | Admitting: Family Medicine

## 2023-03-20 DIAGNOSIS — Z114 Encounter for screening for human immunodeficiency virus [HIV]: Secondary | ICD-10-CM

## 2023-03-20 DIAGNOSIS — G43711 Chronic migraine without aura, intractable, with status migrainosus: Secondary | ICD-10-CM | POA: Diagnosis not present

## 2023-03-20 DIAGNOSIS — Z7251 High risk heterosexual behavior: Secondary | ICD-10-CM

## 2023-03-20 NOTE — Progress Notes (Signed)
 HIV,RPR,HEPC Screenings added for previously ordered labs

## 2023-03-21 ENCOUNTER — Telehealth: Payer: Self-pay | Admitting: *Deleted

## 2023-03-21 ENCOUNTER — Other Ambulatory Visit: Payer: Self-pay | Admitting: Family Medicine

## 2023-03-21 ENCOUNTER — Encounter: Payer: Self-pay | Admitting: Family Medicine

## 2023-03-21 DIAGNOSIS — Z862 Personal history of diseases of the blood and blood-forming organs and certain disorders involving the immune mechanism: Secondary | ICD-10-CM | POA: Diagnosis not present

## 2023-03-21 DIAGNOSIS — G894 Chronic pain syndrome: Secondary | ICD-10-CM | POA: Diagnosis not present

## 2023-03-21 DIAGNOSIS — G8929 Other chronic pain: Secondary | ICD-10-CM | POA: Diagnosis not present

## 2023-03-21 DIAGNOSIS — T7421XA Adult sexual abuse, confirmed, initial encounter: Secondary | ICD-10-CM

## 2023-03-21 DIAGNOSIS — E559 Vitamin D deficiency, unspecified: Secondary | ICD-10-CM | POA: Diagnosis not present

## 2023-03-21 DIAGNOSIS — R7303 Prediabetes: Secondary | ICD-10-CM | POA: Diagnosis not present

## 2023-03-21 DIAGNOSIS — I951 Orthostatic hypotension: Secondary | ICD-10-CM | POA: Diagnosis not present

## 2023-03-21 DIAGNOSIS — M25551 Pain in right hip: Secondary | ICD-10-CM | POA: Diagnosis not present

## 2023-03-21 NOTE — Progress Notes (Signed)
 Complex Care Management Note  Care Guide Note 03/21/2023 Name: Theresa Phillips MRN: [id number] DOB: 11-May-1980  Theresa Phillips is a 43 y.o. year old female who sees Simmons-Robinson, Tawanna Cooler, MD for primary care. I reached out to [employer] by phone today to offer complex care management services.  [name] was given information about Complex Care Management services today including:   The Complex Care Management services include support from the care team which includes your Nurse Care Manager, Clinical Social Worker, or Pharmacist.  The Complex Care Management team is here to help remove barriers to the health concerns and goals most important to you. Complex Care Management services are voluntary, and the patient may decline or stop services at any time by request to their care team member.   Complex Care Management Consent Status: Patient agreed to services and verbal consent obtained.   Follow up plan:  Telephone appointment with complex care management team member scheduled for:  03/23/2023  Encounter Outcome:  Patient Scheduled  Burman Nieves, CMA, Care Guide [facility]  [facility], [facility] Guide Direct Dial: [phone]  Fax: [phone] Website: [email or web address]

## 2023-03-22 ENCOUNTER — Encounter: Payer: Self-pay | Admitting: Obstetrics and Gynecology

## 2023-03-22 ENCOUNTER — Other Ambulatory Visit (HOSPITAL_COMMUNITY)
Admission: RE | Admit: 2023-03-22 | Discharge: 2023-03-22 | Disposition: A | Discharge status: Home / Self Care | Payer: Medicaid Other | Source: Ambulatory Visit | Attending: Obstetrics and Gynecology | Admitting: Obstetrics and Gynecology

## 2023-03-22 ENCOUNTER — Ambulatory Visit (INDEPENDENT_AMBULATORY_CARE_PROVIDER_SITE_OTHER): Payer: Medicaid Other | Admitting: Obstetrics and Gynecology

## 2023-03-22 ENCOUNTER — Telehealth: Payer: Self-pay | Admitting: Pharmacy Technician

## 2023-03-22 ENCOUNTER — Other Ambulatory Visit (HOSPITAL_COMMUNITY): Payer: Self-pay

## 2023-03-22 ENCOUNTER — Encounter: Payer: Self-pay | Admitting: Family Medicine

## 2023-03-22 VITALS — BP 123/78 | HR 93 | Ht 65.0 in | Wt 261.0 lb

## 2023-03-22 DIAGNOSIS — N761 Subacute and chronic vaginitis: Secondary | ICD-10-CM

## 2023-03-22 LAB — HIV ANTIBODY (ROUTINE TESTING W REFLEX): HIV Screen 4th Generation wRfx: NONREACTIVE

## 2023-03-22 LAB — COMPREHENSIVE METABOLIC PANEL
ALT: 14 IU/L (ref 0–32)
AST: 17 IU/L (ref 0–40)
Albumin: 3.9 g/dL (ref 3.9–4.9)
Alkaline Phosphatase: 137 IU/L — ABNORMAL HIGH (ref 44–121)
BUN/Creatinine Ratio: 8 — ABNORMAL LOW (ref 9–23)
BUN: 6 mg/dL (ref 6–24)
Bilirubin Total: <0.2 mg/dL (ref 0.0–1.2)
CO2: 19 mmol/L — ABNORMAL LOW (ref 20–29)
Calcium: 8.9 mg/dL (ref 8.7–10.2)
Chloride: 104 mmol/L (ref 96–106)
Creatinine, Ser: 0.77 mg/dL (ref 0.57–1.00)
Globulin, Total: 2.8 g/dL (ref 1.5–4.5)
Glucose: 78 mg/dL (ref 70–99)
Potassium: 4.1 mmol/L (ref 3.5–5.2)
Sodium: 142 mmol/L (ref 134–144)
Total Protein: 6.7 g/dL (ref 6.0–8.5)
eGFR: 97 mL/min/{1.73_m2} (ref >59)

## 2023-03-22 LAB — HEPATITIS C ANTIBODY: Hep C Virus Ab: NONREACTIVE

## 2023-03-22 LAB — HEMOGLOBIN A1C
Est. average glucose Bld gHb Est-mCnc: 111 mg/dL
Hgb A1c MFr Bld: 5.5 % (ref 4.8–5.6)

## 2023-03-22 LAB — IRON,TIBC AND FERRITIN PANEL
Ferritin: 27 ng/mL (ref 15–150)
Iron Saturation: 24 % (ref 15–55)
Iron: 77 ug/dL (ref 27–159)
Total Iron Binding Capacity: 319 ug/dL (ref 250–450)
UIBC: 242 ug/dL (ref 131–425)

## 2023-03-22 LAB — RPR: RPR Ser Ql: NONREACTIVE

## 2023-03-22 LAB — TSH+T4F+T3FREE
Free T4: 0.56 ng/dL — ABNORMAL LOW (ref 0.82–1.77)
T3, Free: 2.3 pg/mL (ref 2.0–4.4)
TSH: 5.34 u[IU]/mL — ABNORMAL HIGH (ref 0.450–4.500)

## 2023-03-22 LAB — VITAMIN D 25 HYDROXY (VIT D DEFICIENCY, FRACTURES): Vit D, 25-Hydroxy: 20.5 ng/mL — ABNORMAL LOW (ref 30.0–100.0)

## 2023-03-22 MED ORDER — CLINDAMYCIN PHOSPHATE 1 % EX GEL: Freq: Two times a day (BID) | CUTANEOUS | 3 refills | Status: AC | PRN

## 2023-03-22 NOTE — Telephone Encounter (Signed)
 PA request has been  RECEIVED . New Encounter will be created for follow up. For additional info see Pharmacy Prior Auth telephone encounter from 03/22/2023.

## 2023-03-22 NOTE — Progress Notes (Signed)
 43 y.o. GYN presents for vaginal discomfort, itching in groin and bumps x 3+ months.

## 2023-03-22 NOTE — Telephone Encounter (Signed)
 Pharmacy Patient Advocate Encounter  Received notification from [facility] MEDICAID that Prior Authorization for AZELASTINE 0.05% OPHTHALMIC SOLUTION has been APPROVED from 03/22/2023 to 03/21/2024. Ran test claim, Copay is $4.00. This test claim was processed through [facility]- copay amounts may vary at other pharmacies due to pharmacy/plan contracts, or as the patient moves through the different stages of their insurance plan.   PA #/Case ID/Reference #: [id number]

## 2023-03-22 NOTE — Telephone Encounter (Signed)
 Pharmacy Patient Advocate Encounter   Received notification from RX Request Messages that prior authorization for AZELASTINE 0.05% OPHTHALMIC SOLUTION is required/requested.   Insurance verification completed.   The patient is insured through [facility] MEDICAID .   Per test claim: PA required; PA submitted to above mentioned insurance via CoverMyMeds Key/confirmation #/EOC [id number] Status is pending
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
## 2023-03-23 ENCOUNTER — Ambulatory Visit: Payer: Self-pay | Admitting: *Deleted

## 2023-03-23 ENCOUNTER — Other Ambulatory Visit (HOSPITAL_COMMUNITY): Payer: Self-pay
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
## 2023-03-25 LAB — CERVICOVAGINAL ANCILLARY ONLY
Bacterial Vaginitis (gardnerella): NEGATIVE
Candida Glabrata: NEGATIVE
Candida Vaginitis: NEGATIVE
Chlamydia: NEGATIVE
Comment: NEGATIVE
Comment: NEGATIVE
Comment: NEGATIVE
Comment: NEGATIVE
Comment: NEGATIVE
Comment: NORMAL
Neisseria Gonorrhea: NEGATIVE
Trichomonas: NEGATIVE

## 2023-03-25 NOTE — Patient Instructions (Signed)
 Visit Information  Thank you for taking time to visit with me today. Please don't hesitate to contact me if I can be of assistance to you.   Following are the goals we discussed today:   Goals Addressed             This Visit's Progress    care corrdination activities        EMOTIONAL / MENTAL HEALTH SUPPORT Keep all upcoming appointment discussed today Continue with compliance of taking medication prescribed by Doctor Self Support options  (please call 911 in the event that your safety is in jeopardy, please continue to consider ongoing mental health support)         Our next appointment is by telephone on 03/26/23 at 1:30pm  Please call the care guide team at [phone] if you need to cancel or reschedule your appointment.   If you are experiencing a Mental Health or Behavioral Health Crisis or need someone to talk to, please call 911   Patient verbalizes understanding of instructions and care plan provided today and agrees to view in MyChart. Active MyChart status and patient understanding of how to access instructions and care plan via MyChart confirmed with patient.     Telephone follow up appointment with care management team member scheduled for: 03/26/23  Verna Czech, LCSW [place]  [facility], [facility] Licensed Clinical Social Worker Care Coordinator  Direct Dial: [phone]

## 2023-03-26 ENCOUNTER — Other Ambulatory Visit: Payer: Self-pay | Admitting: Family Medicine

## 2023-03-26 ENCOUNTER — Ambulatory Visit: Payer: Self-pay | Admitting: *Deleted
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
## 2023-03-26 NOTE — Patient Instructions (Signed)
 Visit Information  Thank you for taking time to visit with me today. Please don't hesitate to contact me if I can be of assistance to you.   Following are the goals we discussed today:   Goals Addressed             This Visit's Progress    care corrdination activities        EMOTIONAL / MENTAL HEALTH SUPPORT Keep all upcoming appointment discussed today Continue with compliance of taking medication prescribed by Doctor Self Support options: Continue with coping strategies discussed today-consider follow up with the following for ongoing support: [facility] [address]   [place], [place] [address] [phone] Suicide and Crisis lifeline:[address] [facility] [phone]          If you are experiencing a Mental Health or Behavioral Health Crisis or need someone to talk to, please call the Suicide and Crisis Lifeline: 59   Patient verbalizes understanding of instructions and care plan provided today and agrees to view in MyChart. Active MyChart status and patient understanding of how to access instructions and care plan via MyChart confirmed with patient.     No further follow up required: patient to call this social worker with any additional community resource needs.   Demica Zook, LCSW [place]  [facility], [facility] Licensed Clinical Social Worker Care Coordinator  Direct Dial: [phone]

## 2023-03-28 ENCOUNTER — Encounter: Payer: Self-pay | Admitting: Internal Medicine

## 2023-03-29 ENCOUNTER — Encounter: Payer: Self-pay | Admitting: Family Medicine

## 2023-03-29 ENCOUNTER — Ambulatory Visit: Payer: Medicaid Other | Admitting: Family Medicine

## 2023-03-29 VITALS — BP 132/92 | HR 108 | Ht 65.0 in | Wt 260.0 lb

## 2023-03-29 DIAGNOSIS — M51362 Other intervertebral disc degeneration, lumbar region with discogenic back pain and lower extremity pain: Secondary | ICD-10-CM

## 2023-03-29 DIAGNOSIS — G8929 Other chronic pain: Secondary | ICD-10-CM | POA: Diagnosis not present

## 2023-03-29 DIAGNOSIS — M5416 Radiculopathy, lumbar region: Secondary | ICD-10-CM | POA: Diagnosis not present

## 2023-03-29 DIAGNOSIS — M47816 Spondylosis without myelopathy or radiculopathy, lumbar region: Secondary | ICD-10-CM

## 2023-03-29 DIAGNOSIS — E559 Vitamin D deficiency, unspecified: Secondary | ICD-10-CM

## 2023-03-29 DIAGNOSIS — N393 Stress incontinence (female) (male): Secondary | ICD-10-CM | POA: Diagnosis not present

## 2023-03-29 DIAGNOSIS — M797 Fibromyalgia: Secondary | ICD-10-CM | POA: Diagnosis not present

## 2023-03-29 DIAGNOSIS — M5431 Sciatica, right side: Secondary | ICD-10-CM

## 2023-03-29 DIAGNOSIS — G5603 Carpal tunnel syndrome, bilateral upper limbs: Secondary | ICD-10-CM

## 2023-03-29 DIAGNOSIS — M654 Radial styloid tenosynovitis [de Quervain]: Secondary | ICD-10-CM

## 2023-03-29 DIAGNOSIS — K581 Irritable bowel syndrome with constipation: Secondary | ICD-10-CM

## 2023-03-29 MED ORDER — VITAMIN D (ERGOCALCIFEROL) 1.25 MG (50000 UNIT) PO CAPS: 50000.0000 [IU] | ORAL_CAPSULE | ORAL | 1 refills | Status: DC
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
## 2023-03-29 NOTE — Telephone Encounter (Signed)
 Requested medication (s) are due for refill today: Yes  Requested medication (s) are on the active medication list: Yes  Last refill:  03/14/23  Future visit scheduled: Yes  Notes to clinic:  Pharmacy request alternative     Requested Prescriptions  Pending Prescriptions Disp Refills   azelastine (OPTIVAR) 0.05 % ophthalmic solution [Pharmacy Med Name: AZELASTINE HCL 0.05% DROPS] 6 mL 12    Sig: PLACE 2 DROPS INTO BOTH EYES 2 (TWO) TIMES DAILY.     Ophthalmology:  Quincy Carnes - 03/29/2023  9:40 AM      Passed - Valid encounter within last 12 months    Recent Outpatient Visits           4 months ago Chronic neck pain   [place] [facility], Ihor Austin, MD   6 months ago Chronic right hip pain   [place] [facility] at [facility], Ihor Austin, MD   7 months ago Chronic right hip pain   [facility] at [facility], Ihor Austin, MD   8 months ago Chronic right hip pain   [facility] at [facility], Ihor Austin, MD   9 months ago Annual physical exam   [place] [facility], MD       Future Appointments             Today Simmons-Robinson, Tawanna Cooler, MD [facility], [facility]

## 2023-03-29 NOTE — Telephone Encounter (Signed)
 Please see the message below and advise. The Rx that has been sent is not covered under the insurance. Here is a list of medication that is  *cromolyn sodium drops (generic for Crolom) Alocril Drops *olopatadine drops (generic for Pataday, Patanol) Alomide Drops *olopatadine drops (generic for Pataday, Patanol) (OTC)

## 2023-04-03 DIAGNOSIS — I10 Essential (primary) hypertension: Secondary | ICD-10-CM | POA: Diagnosis not present

## 2023-04-03 DIAGNOSIS — R32 Unspecified urinary incontinence: Secondary | ICD-10-CM | POA: Diagnosis not present

## 2023-04-04 ENCOUNTER — Other Ambulatory Visit: Payer: Self-pay

## 2023-04-04 ENCOUNTER — Ambulatory Visit (INDEPENDENT_AMBULATORY_CARE_PROVIDER_SITE_OTHER): Payer: Commercial Managed Care - HMO | Admitting: Internal Medicine

## 2023-04-04 ENCOUNTER — Encounter: Payer: Self-pay | Admitting: Internal Medicine

## 2023-04-04 VITALS — BP 122/78 | HR 94 | Temp 98.2°F | Wt 263.2 lb

## 2023-04-04 DIAGNOSIS — K9049 Malabsorption due to intolerance, not elsewhere classified: Secondary | ICD-10-CM

## 2023-04-04 DIAGNOSIS — J343 Hypertrophy of nasal turbinates: Secondary | ICD-10-CM | POA: Diagnosis not present

## 2023-04-04 DIAGNOSIS — J3089 Other allergic rhinitis: Secondary | ICD-10-CM | POA: Diagnosis not present

## 2023-04-04 NOTE — Progress Notes (Signed)
 NEW PATIENT  Date of Service/Encounter:  04/04/23  Consult requested by: Ronnald Ramp, MD   Subjective:   Theresa Phillips (DOB: [birth year]) is a 43 y.o. female who presents to the clinic on 04/04/2023 with a chief complaint of Allergic Rhinitis  (Allergic to pollen. Sneezing,itching eyes and nose) and Food Allergy .    History obtained from: chart review and patient.   Food Reactions:  Nuts cause migraines Turmeric makes her break out; no hives.  Mango has random skin itching  Never required Epipen.    Rhinitis:  Started many years ago. Symptoms include: nasal congestion, rhinorrhea, post nasal drainage, sneezing, and itchy eyes, puffy eyes, feels so congested and has lots of drainage that she can't breathe well  Occurs year-round but worse in Fall  Potential triggers: not sure  Treatments tried:  Xyzal 5mg  daily Flonase daily Ipratropium PRN  Azelastine eye drops   Previous allergy testing: no History of sinus surgery: no Nonallergic triggers: none     Reviewed:  03/29/2023: seen by PCP for IBS, chronic migraines, sciatica. Referred to main management.   03/14/2023: seen by Dr Sharol Harness for allergic rhinoconjunctivitis.  On Optivar eye drops, Xyzal, Flonase.  Referred to Allergy.     Past Medical History: Past Medical History:  Diagnosis Date   Allergy    See list   Anxiety 05/2014   Chronic pain syndrome    Clotting disorder (HCC)    Jan/Feb 23   GERD (gastroesophageal reflux disease)    Migraines    Neuromuscular disorder (HCC)    PCOS (polycystic ovarian syndrome)    PTSD (post-traumatic stress disorder)    Seizures (HCC)    Sinusitis    Sleep apnea    7 years ago, CPAP   Vitamin D deficiency    Past Surgical History: Past Surgical History:  Procedure Laterality Date   EXPLORATORY LAPAROTOMY     WISDOM TOOTH EXTRACTION      Family History: Family History  Problem Relation Age of Onset   Allergic rhinitis Mother    Early death Mother     Bipolar disorder Father    Allergic rhinitis Brother    Diabetes Maternal Aunt    Breast cancer Paternal Aunt    Diabetes Maternal Grandfather     Social History:  Flooring in bedroom: laminate Pets: none Tobacco use/exposure: none Job: disabled   Medication List:  Allergies as of 04/04/2023       Reactions   Lanolin Itching, Other (See Comments)   Penicillins Other (See Comments), Rash   Almond Oil Other (See Comments)   Ca Phosphate-cholecalciferol Other (See Comments)   Cheese Other (See Comments)   Guaifenesin Other (See Comments)   She is allergic to the capsule not the medication     Methocarbamol Other (See Comments)   Naproxen Other (See Comments)   Precipitates migraines but other NSAIDS ok   Omeprazole Other (See Comments)   headache   Other Other (See Comments)   Peanut (diagnostic) Other (See Comments)   Peanut Allergen Powder-dnfp Other (See Comments)   Peanut-containing Drug Products Other (See Comments)   Pork Allergy Other (See Comments)   Pork-derived Products Other (See Comments)   She is allergic to all capsule    Topiramate Other (See Comments)   Tingling in hands and feet   Carbamazepine Itching   Just the chewable tablets   Clindamycin/lincomycin Itching   Oxycodone Itching   Tylenol [acetaminophen] Other (See Comments)   Gives me "migraines".  States  she can take it mixed with hydrocodone.          Medication List        Accurate as of April 04, 2023 11:17 AM. If you have any questions, ask your nurse or doctor.          amitriptyline 50 MG tablet Commonly known as: ELAVIL Take 1 tablet (50 mg total) by mouth at bedtime.   azelastine 0.05 % ophthalmic solution Commonly known as: OPTIVAR PLACE 2 DROPS INTO BOTH EYES 2 (TWO) TIMES DAILY.   Botox 200 units injection Generic drug: botulinum toxin Type A Inject 155 units into the head and neck muscles every 90 days.   clindamycin 1 % gel Commonly known as: CLINDAGEL Apply  topically 2 (two) times daily as needed.   clobetasol ointment 0.05 % Commonly known as: TEMOVATE Apply to affected area every night for 4 weeks, then every other day for 4 weeks and then twice a week for 4 weeks or until resolution.   clotrimazole-betamethasone cream Commonly known as: LOTRISONE Apply topically 2 (two) times daily.   Fluticasone Propionate 93 MCG/ACT Exhu Place 1 spray into the nose in the morning and at bedtime.   gabapentin 800 MG tablet Commonly known as: Neurontin Take 1 tablet (800 mg total) by mouth 3 (three) times daily.   ibuprofen 800 MG tablet Commonly known as: ADVIL Take 1 tablet (800 mg total) by mouth every 8 (eight) hours as needed.   ipratropium 0.03 % nasal spray Commonly known as: ATROVENT Place 2 sprays into both nostrils every 12 (twelve) hours.   Iron (Ferrous Sulfate) 325 (65 Fe) MG Tabs Take 325 mg by mouth daily.   levocetirizine 5 MG tablet Commonly known as: XYZAL TAKE 1 TABLET (5 MG TOTAL) BY MOUTH DAILY.   mupirocin ointment 2 % Commonly known as: BACTROBAN Apply 1 Application topically 3 (three) times daily.   norethindrone 0.35 MG tablet Commonly known as: MICRONOR Take 1 tablet (0.35 mg total) by mouth daily.   nystatin-triamcinolone ointment Commonly known as: MYCOLOG Apply 1 Application topically 2 (two) times daily.   ondansetron 8 MG tablet Commonly known as: ZOFRAN Take by mouth.   pantoprazole 40 MG tablet Commonly known as: PROTONIX Take by mouth.   rizatriptan 10 MG disintegrating tablet Commonly known as: MAXALT-MLT Take 1 tablet (10 mg total) by mouth as needed for migraine. May repeat in 2 hours if needed. Max dose 2 pills in 24 hours   tiZANidine 4 MG tablet Commonly known as: ZANAFLEX   traMADol 50 MG tablet Commonly known as: ULTRAM Take 50 mg by mouth 2 (two) times daily as needed.   Trulance 3 MG Tabs Generic drug: Plecanatide Take by mouth.   Vitamin D (Ergocalciferol) 1.25 MG (50000  UNIT) Caps capsule Commonly known as: DRISDOL Take 1 capsule (50,000 Units total) by mouth every 7 (seven) days.         REVIEW OF SYSTEMS: Pertinent positives and negatives discussed in HPI.   Objective:   Physical Exam: BP 122/78 (Cuff Size: Large)   Pulse 94   Temp 98.2 F (36.8 C) (Temporal)   Wt 263 lb 3.2 oz (119.4 kg)   SpO2 97%   BMI 43.80 kg/m  Body mass index is 43.8 kg/m. GEN: alert, well developed HEENT: clear conjunctiva,  nose with + mild inferior turbinate hypertrophy, pink nasal mucosa, slight clear rhinorrhea, no cobblestoning HEART: regular rate and rhythm, no murmur LUNGS: clear to auscultation bilaterally, no coughing, unlabored respiration ABDOMEN:  soft, non distended  SKIN: no rashes or lesions  Assessment:   1. Other allergic rhinitis   2. Nasal turbinate hypertrophy   3. Food intolerance     Plan/Recommendations:  Other Allergic Rhinitis: - Due to turbinate hypertrophy, seasonal flare ups and unresponsive to over the counter meds, will perform skin testing to identify aeroallergen triggers.   - Use nasal saline rinses before nose sprays such as with Neilmed Sinus Rinse.  Use distilled water.   - Use Flonase 2 sprays each nostril daily. Aim upward and outward. - Use Xyzal 5 mg daily. Okay to take second dose if needed.  - For eyes, use Olopatadine or Ketotifen or Azelastine 1 eye drop up to twice daily as needed for itchy, watery eyes.  Available over the counter, if not covered by insurance.   Food Intolerance - Nuts with migraines, turmeric causes break outs, mango causes random skin itching  - Discussed these are food intolerances rather than allergies; no further testing needed.  - Continue avoidance or eat in small amounts as tolerated.   Hold all anti-histamines (Xyzal, Azelastine eye drops, Allegra, Zyrtec, Claritin, Benadryl, Pepcid) 3 days prior to next visit.  Follow up: 10 AM on 3/19 for skin testing 1-55, no ID  Alesia Morin,  MD [facility]

## 2023-04-04 NOTE — Patient Instructions (Addendum)
 Other Allergic Rhinitis: - Use nasal saline rinses before nose sprays such as with Neilmed Sinus Rinse as needed.  Use distilled water.   - Use Flonase 2 sprays each nostril daily. Aim upward and outward. - Use Xyzal 5 mg daily. Okay to take second dose if needed.  - For eyes, use Olopatadine or Ketotifen or Azelastine 1 eye drop up to twice daily as needed for itchy, watery eyes.  Available over the counter, if not covered by insurance.    Food Intolerance - Nuts with migraines, turmeric causes break outs, mango causes random skin itching  - Continue avoidance or eat in small amounts as tolerated.   Hold all anti-histamines (Xyzal, Azelastine eye drops, Allegra, Zyrtec, Claritin, Benadryl, Pepcid) 3 days prior to next visit.  Follow up: 10 AM on 3/19 for skin testing 1-55

## 2023-04-05 ENCOUNTER — Ambulatory Visit: Payer: Self-pay | Admitting: Family Medicine

## 2023-04-09 ENCOUNTER — Encounter: Payer: Self-pay | Admitting: Family Medicine

## 2023-04-09 DIAGNOSIS — M51362 Other intervertebral disc degeneration, lumbar region with discogenic back pain and lower extremity pain: Secondary | ICD-10-CM

## 2023-04-09 DIAGNOSIS — M5416 Radiculopathy, lumbar region: Secondary | ICD-10-CM

## 2023-04-09 DIAGNOSIS — M47816 Spondylosis without myelopathy or radiculopathy, lumbar region: Secondary | ICD-10-CM

## 2023-04-09 DIAGNOSIS — M2550 Pain in unspecified joint: Secondary | ICD-10-CM

## 2023-04-09 DIAGNOSIS — G5603 Carpal tunnel syndrome, bilateral upper limbs: Secondary | ICD-10-CM

## 2023-04-09 DIAGNOSIS — M5431 Sciatica, right side: Secondary | ICD-10-CM

## 2023-04-09 DIAGNOSIS — M25511 Pain in right shoulder: Secondary | ICD-10-CM

## 2023-04-09 DIAGNOSIS — M222X1 Patellofemoral disorders, right knee: Secondary | ICD-10-CM

## 2023-04-09 DIAGNOSIS — M797 Fibromyalgia: Secondary | ICD-10-CM

## 2023-04-09 DIAGNOSIS — G8929 Other chronic pain: Secondary | ICD-10-CM

## 2023-04-09 DIAGNOSIS — M654 Radial styloid tenosynovitis [de Quervain]: Secondary | ICD-10-CM

## 2023-04-11 ENCOUNTER — Ambulatory Visit (INDEPENDENT_AMBULATORY_CARE_PROVIDER_SITE_OTHER): Admitting: Internal Medicine

## 2023-04-11 DIAGNOSIS — H1013 Acute atopic conjunctivitis, bilateral: Secondary | ICD-10-CM

## 2023-04-11 DIAGNOSIS — J301 Allergic rhinitis due to pollen: Secondary | ICD-10-CM | POA: Diagnosis not present

## 2023-04-11 DIAGNOSIS — J309 Allergic rhinitis, unspecified: Secondary | ICD-10-CM

## 2023-04-11 MED ORDER — AZELASTINE HCL 0.05 % OP SOLN: 1.0000 [drp] | Freq: Two times a day (BID) | OPHTHALMIC | 5 refills | Status: DC | PRN

## 2023-04-11 MED ORDER — AZELASTINE HCL 0.1 % NA SOLN: 2.0000 | Freq: Two times a day (BID) | NASAL | 5 refills | Status: DC | PRN

## 2023-04-11 MED ORDER — LEVOCETIRIZINE DIHYDROCHLORIDE 5 MG PO TABS: 5.0000 mg | ORAL_TABLET | Freq: Every day | ORAL | 1 refills | Status: DC

## 2023-04-11 MED ORDER — FLUTICASONE PROPIONATE 50 MCG/ACT NA SUSP: 2.0000 | Freq: Every day | NASAL | 5 refills | Status: DC

## 2023-04-11 NOTE — Addendum Note (Signed)
 Addended byAlesia Morin on: 04/11/2023 10:51 AM   Modules accepted: Orders

## 2023-04-11 NOTE — Patient Instructions (Addendum)
 Allergic Rhinitis: - SPT 03/2023: positive to trees, grasses, weeds  - Use nasal saline rinses before nose sprays such as with Neilmed Sinus Rinse as needed.  Use distilled water.   - Use Flonase 2 sprays each nostril daily. Aim upward and outward. - Use Azelastine 2 sprays each nostril twice daily as needed for congestion, drainage, runny nose, sneezing. Aim upward and outward. - Use Xyzal 5 mg daily. Okay to take second dose if needed.  - For eyes, use Olopatadine or Ketotifen or Azelastine 1 eye drop up to twice daily as needed for itchy, watery eyes.  Available over the counter, if not covered by insurance.  - Consider allergy shots as long term control of your symptoms by teaching your immune system to be more tolerant of your allergy triggers    ALLERGEN AVOIDANCE MEASURES  Pollen Avoidance Pollen levels are highest during the mid-day and afternoon.  Consider this when planning outdoor activities. Avoid being outside when the grass is being mowed, or wear a mask if the pollen-allergic person must be the one to mow the grass. Keep the windows closed to keep pollen outside of the home. Use an air conditioner to filter the air. Take a shower, wash hair, and change clothing after working or playing outdoors during pollen season.

## 2023-04-11 NOTE — Progress Notes (Signed)
 FOLLOW UP Date of Service/Encounter:  04/11/23   Subjective:  Theresa Phillips (DOB: 1980/10/20) is a 43 y.o. female who [facility] for follow up for skin testing.   History obtained from: chart review and patient.  Anti histamines held.   Past Medical History: Past Medical History:  Diagnosis Date   Allergy    See list   Anxiety 05/2014   Chronic pain syndrome    Clotting disorder (HCC)    Jan/Feb 23   GERD (gastroesophageal reflux disease)    Migraines    Neuromuscular disorder (HCC)    PCOS (polycystic ovarian syndrome)    PTSD (post-traumatic stress disorder)    Seizures (HCC)    Sinusitis    Sleep apnea    7 years ago, CPAP   Vitamin D deficiency     Objective:  There were no vitals taken for this visit. There is no height or weight on file to calculate BMI. Physical Exam: GEN: alert, well developed HEENT: clear conjunctiva, MMM LUNGS: unlabored respiration  Skin Testing:  Skin prick testing was placed, which includes aeroallergens/foods, histamine control, and saline control.  Verbal consent was obtained prior to placing test.  Patient tolerated procedure well.  Allergy testing results were read and interpreted by myself, documented by clinical staff. Adequate positive and negative control.  Positive results to:  Results discussed with patient/family.  Airborne Adult Perc - 04/11/23 1011     Time Antigen Placed 1011    Allergen Manufacturer Greer    Location Back    Number of Test 55    Panel 1 Select    2. Control-Histamine 3+    3. Bahia 3+    4. [place] 3+    5. Johnson 3+    6. [place] Blue 3+    7. Meadow Fescue 3+    8. Perennial Rye 3+    9. Timothy 3+    10. Ragweed Mix Negative    11. Cocklebur Negative    12. Plantain,  English Negative    13. Baccharis Negative    14. Dog Fennel Negative    15. Russian Thistle Negative    16. Lamb's Quarters 3+    17. Sheep Sorrell 2+    18. Rough Pigweed  Negative    19. Marsh Elder, Rough Negative    20. Mugwort, Common Negative    21. Box, Elder Negative    22. Cedar, red 3+    23. Sweet Gum Negative    24. Pecan Pollen Negative    25. Pine Mix Negative    26. Walnut, Black Pollen Negative    27. Red Mulberry Negative    28. Ash Mix Negative    29. Birch Mix Negative    30. Beech American 3+    31. Cottonwood, Guinea-Bissau Negative    32. Hickory, White Negative    33. Maple Mix Negative    34. Oak, [place] Mix 3+    35. Sycamore Eastern Negative    36. Alternaria Alternata Negative    37. Cladosporium Herbarum Negative    38. Aspergillus Mix Negative    39. Penicillium Mix Negative    40. Bipolaris Sorokiniana (Helminthosporium) Negative    41. Drechslera Spicifera (Curvularia) Negative    42. Mucor Plumbeus Negative    43. Fusarium Moniliforme Negative    44. Aureobasidium Pullulans (pullulara) Negative    45. Rhizopus Oryzae Negative    46. Botrytis Cinera Negative  47. Epicoccum Nigrum Negative    48. Phoma Betae Negative    49. Dust Mite Mix Negative    50. Cat Hair 10,000 BAU/ml Negative    51.  Dog Epithelia Negative    52. Mixed Feathers Negative    53. Horse Epithelia Negative    54. Cockroach, German Negative    55. Tobacco Leaf Negative              Assessment:   1. Seasonal allergic rhinitis due to pollen     Plan/Recommendations:  Allergic Rhinitis: - Due to turbinate hypertrophy, seasonal flare ups and unresponsive to over the counter meds, will perform skin testing to identify aeroallergen triggers.   - SPT 03/2023: positive to trees, grasses, weeds  - Use nasal saline rinses before nose sprays such as with Neilmed Sinus Rinse as needed.  Use distilled water.   - Use Flonase 2 sprays each nostril daily. Aim upward and outward. - Use Azelastine 2 sprays each nostril twice daily as needed for congestion, drainage, runny nose, sneezing. Aim upward and outward. - Use Xyzal 5 mg daily. Okay to take  second dose if needed.  - For eyes, use Olopatadine or Ketotifen or Azelastine 1 eye drop up to twice daily as needed for itchy, watery eyes.  Available over the counter, if not covered by insurance.  - Consider allergy shots as long term control of your symptoms by teaching your immune system to be more tolerant of your allergy triggers    Return in about 3 months (around 07/12/2023).  Alesia Morin, MD [facility]

## 2023-04-20 ENCOUNTER — Ambulatory Visit: Admitting: Internal Medicine

## 2023-04-26 DIAGNOSIS — G43711 Chronic migraine without aura, intractable, with status migrainosus: Secondary | ICD-10-CM | POA: Diagnosis not present

## 2023-05-02 ENCOUNTER — Encounter: Payer: Self-pay | Admitting: Internal Medicine

## 2023-05-03 MED ORDER — LEVOCETIRIZINE DIHYDROCHLORIDE 5 MG PO TABS: 5.0000 mg | ORAL_TABLET | Freq: Every day | ORAL | 0 refills | Status: DC

## 2023-05-10 ENCOUNTER — Ambulatory Visit: Admitting: Family Medicine

## 2023-05-24 DIAGNOSIS — G4486 Cervicogenic headache: Secondary | ICD-10-CM | POA: Diagnosis not present

## 2023-05-24 DIAGNOSIS — G4733 Obstructive sleep apnea (adult) (pediatric): Secondary | ICD-10-CM | POA: Diagnosis not present

## 2023-05-24 DIAGNOSIS — G43711 Chronic migraine without aura, intractable, with status migrainosus: Secondary | ICD-10-CM | POA: Diagnosis not present

## 2023-05-24 DIAGNOSIS — E663 Overweight: Secondary | ICD-10-CM | POA: Diagnosis not present

## 2023-05-29 ENCOUNTER — Encounter: Payer: Self-pay | Admitting: Family Medicine

## 2023-05-29 ENCOUNTER — Other Ambulatory Visit: Payer: Self-pay | Admitting: Family Medicine

## 2023-05-29 ENCOUNTER — Telehealth: Payer: Self-pay | Admitting: Family Medicine

## 2023-05-29 DIAGNOSIS — M25551 Pain in right hip: Secondary | ICD-10-CM

## 2023-05-29 DIAGNOSIS — Z1231 Encounter for screening mammogram for malignant neoplasm of breast: Secondary | ICD-10-CM

## 2023-05-29 NOTE — Telephone Encounter (Signed)
 Please see the pt request below

## 2023-05-29 NOTE — Telephone Encounter (Signed)
 Copied from CRM [id number]. Topic: Referral - Question >> May 29, 2023  1:35 PM Lizabeth Riggs wrote: Reason for CRM:  Physical Therapy and Sai would like for Dr. Branson Calandra to delete these from the referral  M54.16 (ICD-10-CM) - Lumbar radiculopathy G56.03 (ICD-10-CM) - Bilateral carpal tunnel syndrome M65.4 (ICD-10-CM) - Radial styloid tenosynovitis of right hand M65.4 (ICD-10-CM) - Radial styloid tenosynovitis of left hand M22.2X1,M22.2X2 (ICD-10-CM) - Patellofemoral syndrome, bilateral M51.362 (ICD-10-CM) - Degeneration of intervertebral disc of lumbar region with discogenic back pain and lower extremity pain M47.816 (ICD-10-CM) - Facet arthropathy, lumbar M25.50 (ICD-10-CM) - Multiple joint pain M79.7 (ICD-10-CM) - Fibromyalgia  Please Add Right Hip Pain Please correct and resend in referral. Thanks

## 2023-05-31 NOTE — Telephone Encounter (Signed)
Please see the message below and advise.

## 2023-06-01 MED ORDER — PREDNISONE 10 MG PO TABS: ORAL_TABLET | ORAL | 0 refills | Status: DC

## 2023-06-01 NOTE — Addendum Note (Signed)
 Addended by: Rochester Chuck on: 06/01/2023 05:02 PM   Modules accepted: Orders

## 2023-06-01 NOTE — Telephone Encounter (Signed)
 New referral submitted for right hip pain

## 2023-06-05 ENCOUNTER — Ambulatory Visit: Admitting: Internal Medicine

## 2023-06-05 ENCOUNTER — Ambulatory Visit: Attending: Family Medicine | Admitting: Physical Therapy

## 2023-06-05 DIAGNOSIS — M25551 Pain in right hip: Secondary | ICD-10-CM | POA: Diagnosis present

## 2023-06-05 DIAGNOSIS — M222X2 Patellofemoral disorders, left knee: Secondary | ICD-10-CM | POA: Insufficient documentation

## 2023-06-05 DIAGNOSIS — M47816 Spondylosis without myelopathy or radiculopathy, lumbar region: Secondary | ICD-10-CM | POA: Diagnosis not present

## 2023-06-05 DIAGNOSIS — G8929 Other chronic pain: Secondary | ICD-10-CM | POA: Insufficient documentation

## 2023-06-05 DIAGNOSIS — M542 Cervicalgia: Secondary | ICD-10-CM | POA: Diagnosis not present

## 2023-06-05 DIAGNOSIS — M222X1 Patellofemoral disorders, right knee: Secondary | ICD-10-CM | POA: Diagnosis not present

## 2023-06-05 DIAGNOSIS — M5431 Sciatica, right side: Secondary | ICD-10-CM | POA: Diagnosis not present

## 2023-06-05 DIAGNOSIS — M797 Fibromyalgia: Secondary | ICD-10-CM | POA: Insufficient documentation

## 2023-06-05 DIAGNOSIS — M25562 Pain in left knee: Secondary | ICD-10-CM | POA: Diagnosis not present

## 2023-06-05 DIAGNOSIS — M2550 Pain in unspecified joint: Secondary | ICD-10-CM | POA: Insufficient documentation

## 2023-06-05 DIAGNOSIS — G5603 Carpal tunnel syndrome, bilateral upper limbs: Secondary | ICD-10-CM | POA: Diagnosis not present

## 2023-06-05 DIAGNOSIS — R262 Difficulty in walking, not elsewhere classified: Secondary | ICD-10-CM | POA: Diagnosis present

## 2023-06-05 DIAGNOSIS — M25561 Pain in right knee: Secondary | ICD-10-CM | POA: Diagnosis not present

## 2023-06-05 DIAGNOSIS — M5416 Radiculopathy, lumbar region: Secondary | ICD-10-CM | POA: Insufficient documentation

## 2023-06-05 DIAGNOSIS — M5441 Lumbago with sciatica, right side: Secondary | ICD-10-CM | POA: Diagnosis not present

## 2023-06-05 DIAGNOSIS — M654 Radial styloid tenosynovitis [de Quervain]: Secondary | ICD-10-CM | POA: Diagnosis not present

## 2023-06-05 DIAGNOSIS — M25511 Pain in right shoulder: Secondary | ICD-10-CM | POA: Insufficient documentation

## 2023-06-05 DIAGNOSIS — M51362 Other intervertebral disc degeneration, lumbar region with discogenic back pain and lower extremity pain: Secondary | ICD-10-CM | POA: Insufficient documentation
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
## 2023-06-07 ENCOUNTER — Ambulatory Visit: Admitting: Physical Therapy

## 2023-06-07 ENCOUNTER — Encounter: Payer: Self-pay | Admitting: Physical Therapy

## 2023-06-07 DIAGNOSIS — R262 Difficulty in walking, not elsewhere classified: Secondary | ICD-10-CM

## 2023-06-07 DIAGNOSIS — M25551 Pain in right hip: Secondary | ICD-10-CM

## 2023-06-10 DIAGNOSIS — I10 Essential (primary) hypertension: Secondary | ICD-10-CM | POA: Diagnosis not present

## 2023-06-10 DIAGNOSIS — R32 Unspecified urinary incontinence: Secondary | ICD-10-CM | POA: Diagnosis not present

## 2023-06-11 ENCOUNTER — Ambulatory Visit

## 2023-06-11 DIAGNOSIS — M25551 Pain in right hip: Secondary | ICD-10-CM

## 2023-06-11 DIAGNOSIS — R262 Difficulty in walking, not elsewhere classified: Secondary | ICD-10-CM
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
## 2023-06-12 NOTE — Patient Instructions (Incomplete)
 Allergic Rhinitis: - SPT 03/2023: positive to trees, grasses, weeds  - Use nasal saline rinses before nose sprays such as with Neilmed Sinus Rinse as needed.  Use distilled water.   - Use Flonase 2 sprays each nostril daily. Aim upward and outward. - Use Azelastine 2 sprays each nostril twice daily as needed for congestion, drainage, runny nose, sneezing. Aim upward and outward. - Use Xyzal 5 mg daily. Okay to take second dose if needed.  - For eyes, use Olopatadine or Ketotifen or Azelastine 1 eye drop up to twice daily as needed for itchy, watery eyes.  Available over the counter, if not covered by insurance.  - Consider allergy shots as long term control of your symptoms by teaching your immune system to be more tolerant of your allergy triggers    ALLERGEN AVOIDANCE MEASURES  Pollen Avoidance Pollen levels are highest during the mid-day and afternoon.  Consider this when planning outdoor activities. Avoid being outside when the grass is being mowed, or wear a mask if the pollen-allergic person must be the one to mow the grass. Keep the windows closed to keep pollen outside of the home. Use an air conditioner to filter the air. Take a shower, wash hair, and change clothing after working or playing outdoors during pollen season.

## 2023-06-13 ENCOUNTER — Encounter: Admitting: Physical Therapy

## 2023-06-13 ENCOUNTER — Ambulatory Visit: Admitting: Family

## 2023-06-13 DIAGNOSIS — G4733 Obstructive sleep apnea (adult) (pediatric): Secondary | ICD-10-CM | POA: Diagnosis not present

## 2023-06-13 DIAGNOSIS — H9313 Tinnitus, bilateral: Secondary | ICD-10-CM | POA: Diagnosis not present

## 2023-06-13 DIAGNOSIS — G4486 Cervicogenic headache: Secondary | ICD-10-CM | POA: Diagnosis not present

## 2023-06-13 DIAGNOSIS — G43711 Chronic migraine without aura, intractable, with status migrainosus: Secondary | ICD-10-CM | POA: Diagnosis not present

## 2023-06-13 DIAGNOSIS — G4440 Drug-induced headache, not elsewhere classified, not intractable: Secondary | ICD-10-CM | POA: Diagnosis not present

## 2023-06-13 DIAGNOSIS — G500 Trigeminal neuralgia: Secondary | ICD-10-CM | POA: Diagnosis not present

## 2023-06-14 ENCOUNTER — Ambulatory Visit
Admission: RE | Admit: 2023-06-14 | Discharge: 2023-06-14 | Disposition: A | Discharge status: Home / Self Care | Source: Ambulatory Visit | Attending: Family Medicine | Admitting: Family Medicine

## 2023-06-14 DIAGNOSIS — Z1231 Encounter for screening mammogram for malignant neoplasm of breast: Secondary | ICD-10-CM

## 2023-06-15 DIAGNOSIS — Z3202 Encounter for pregnancy test, result negative: Secondary | ICD-10-CM | POA: Diagnosis not present

## 2023-06-15 DIAGNOSIS — M25562 Pain in left knee: Secondary | ICD-10-CM | POA: Diagnosis not present

## 2023-06-15 DIAGNOSIS — M25572 Pain in left ankle and joints of left foot: Secondary | ICD-10-CM | POA: Diagnosis not present

## 2023-06-19 ENCOUNTER — Ambulatory Visit: Payer: Self-pay | Admitting: Physical Therapy

## 2023-06-19 DIAGNOSIS — M25551 Pain in right hip: Secondary | ICD-10-CM | POA: Diagnosis not present

## 2023-06-19 DIAGNOSIS — R262 Difficulty in walking, not elsewhere classified: Secondary | ICD-10-CM
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
## 2023-06-21 ENCOUNTER — Ambulatory Visit: Admitting: Physical Therapy

## 2023-06-21 ENCOUNTER — Ambulatory Visit: Payer: Self-pay | Admitting: Physician Assistant

## 2023-06-21 DIAGNOSIS — M25551 Pain in right hip: Secondary | ICD-10-CM

## 2023-06-21 DIAGNOSIS — R262 Difficulty in walking, not elsewhere classified: Secondary | ICD-10-CM
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
## 2023-06-25 ENCOUNTER — Encounter: Admitting: Physical Therapy

## 2023-06-26 ENCOUNTER — Telehealth (INDEPENDENT_AMBULATORY_CARE_PROVIDER_SITE_OTHER): Admitting: Family Medicine

## 2023-06-26 ENCOUNTER — Ambulatory Visit: Payer: Self-pay

## 2023-06-26 ENCOUNTER — Encounter: Payer: Self-pay | Admitting: Family Medicine

## 2023-06-26 DIAGNOSIS — W19XXXA Unspecified fall, initial encounter: Secondary | ICD-10-CM

## 2023-06-26 DIAGNOSIS — S8000XA Contusion of unspecified knee, initial encounter: Secondary | ICD-10-CM | POA: Diagnosis not present

## 2023-06-26 DIAGNOSIS — R569 Unspecified convulsions: Secondary | ICD-10-CM

## 2023-06-26 DIAGNOSIS — R296 Repeated falls: Secondary | ICD-10-CM

## 2023-06-26 DIAGNOSIS — R2689 Other abnormalities of gait and mobility: Secondary | ICD-10-CM | POA: Diagnosis not present

## 2023-06-26 DIAGNOSIS — R110 Nausea: Secondary | ICD-10-CM | POA: Diagnosis not present

## 2023-06-26 DIAGNOSIS — S80219A Abrasion, unspecified knee, initial encounter: Secondary | ICD-10-CM

## 2023-06-26 DIAGNOSIS — G43711 Chronic migraine without aura, intractable, with status migrainosus: Secondary | ICD-10-CM

## 2023-06-26 DIAGNOSIS — S40019A Contusion of unspecified shoulder, initial encounter: Secondary | ICD-10-CM | POA: Diagnosis not present

## 2023-06-26 DIAGNOSIS — S40219A Abrasion of unspecified shoulder, initial encounter: Secondary | ICD-10-CM

## 2023-06-26 MED ORDER — PROMETHAZINE HCL 25 MG PO TABS: 25.0000 mg | ORAL_TABLET | Freq: Four times a day (QID) | ORAL | 0 refills | Status: AC | PRN
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
## 2023-06-26 NOTE — Progress Notes (Addendum)
 MyChart Video Visit    Virtual Visit via Video Note   This format is felt to be most appropriate for this patient at this time. Physical exam was limited by quality of the video and audio technology used for the visit.   Patient location: Patient's home address   Provider location: [facility]  [address]  [place], [place] [address]   I discussed the limitations of evaluation and management by telemedicine and the availability of in person appointments. The patient expressed understanding and agreed to proceed.  Patient: Theresa Phillips   DOB: 01/04/1981   43 y.o. Female  MRN: [id number] Visit Date: 06/26/2023  Today's healthcare provider: Mimi Alt, MD   No chief complaint on file.  Subjective    HPI   Discussed the use of AI scribe software for clinical note transcription with the patient, who gave verbal consent to proceed.  History of Present Illness Theresa Phillips is a 43 year old female with migraines who presents with severe migraine and vomiting.  She is experiencing a severe migraine accompanied by nausea and vomiting, which has prevented her from leaving her home. Her migraines sometimes cause extreme nausea, vomiting, and a sensation of being off balance, making it difficult for her to drive. She attempted to drive during a similar episode a few weeks ago but had to return home due to her symptoms.  She follows with neurology and allergy /immunology for her migraines and is currently on several medications including Elavil  50 mg at bedtime, Botox  injections, Zofran  8 mg as needed, and Zanaflex 4 mg. During this episode, she has taken Dextran and Rizatriptan , which have helped reduce her symptoms after sleeping. She also uses Depakote 250 mg for migraine pain in her eye and takes carbamazepine and gabapentin  for nerve pain, alternating every two hours.  She reports a fall approximately ten days ago, which led her to seek urgent  care. She describes feeling off balance and nauseous, similar to her current state, and fell while getting off her aunt's food truck. She sustained bruises and scrapes on her knee and shoulder but did not break any bones. She is undergoing physical therapy and reports working on strengthening her core and hips.  Her nausea was initially at a level of ten out of ten but has decreased to a six or seven after taking Zofran . She has prednisone  available, which was previously prescribed by her neurologist.      Past Medical History:  Diagnosis Date   Allergy     See list   Anxiety 05/2014   Chronic pain syndrome    Clotting disorder (HCC)    Jan/Feb 23   GERD (gastroesophageal reflux disease)    Migraines    Neuromuscular disorder (HCC)    PCOS (polycystic ovarian syndrome)    PTSD (post-traumatic stress disorder)    Seizures (HCC)    Sinusitis    Sleep apnea    7 years ago, CPAP   Vitamin D  deficiency     Medications: Outpatient Medications Prior to Visit  Medication Sig   divalproex (DEPAKOTE) 250 MG DR tablet Take 250 mg by mouth 2 (two) times daily as needed.   amitriptyline  (ELAVIL ) 50 MG tablet Take 1 tablet (50 mg total) by mouth at bedtime.   azelastine  (ASTELIN ) 0.1 % nasal spray Place 2 sprays into both nostrils 2 (two) times daily as needed for allergies. Use in each nostril as directed   azelastine  (OPTIVAR ) 0.05 % ophthalmic solution Place 1  drop into both eyes 2 (two) times daily as needed (itchy watery eyes).   botulinum toxin Type A  (BOTOX ) 200 units injection Inject 155 units into the head and neck muscles every 90 days.   clindamycin  (CLINDAGEL) 1 % gel Apply topically 2 (two) times daily as needed.   clobetasol  ointment (TEMOVATE ) 0.05 % Apply to affected area every night for 4 weeks, then every other day for 4 weeks and then twice a week for 4 weeks or until resolution.   clotrimazole -betamethasone  (LOTRISONE ) cream Apply topically 2 (two) times daily.    fluticasone  (FLONASE ) 50 MCG/ACT nasal spray Place 2 sprays into both nostrils daily.   Fluticasone  Propionate 93 MCG/ACT EXHU Place 1 spray into the nose in the morning and at bedtime.   gabapentin  (NEURONTIN ) 800 MG tablet Take 1 tablet (800 mg total) by mouth 3 (three) times daily.   ibuprofen  (ADVIL ) 800 MG tablet Take 1 tablet (800 mg total) by mouth every 8 (eight) hours as needed.   ipratropium (ATROVENT ) 0.03 % nasal spray Place 2 sprays into both nostrils every 12 (twelve) hours.   Iron , Ferrous Sulfate , 325 (65 Fe) MG TABS Take 325 mg by mouth daily.   levocetirizine (XYZAL ) 5 MG tablet Take 1 tablet (5 mg total) by mouth daily. Can take an additional tablet if needed.   mupirocin  ointment (BACTROBAN ) 2 % Apply 1 Application topically 3 (three) times daily.   norethindrone  (MICRONOR ) 0.35 MG tablet Take 1 tablet (0.35 mg total) by mouth daily.   nystatin -triamcinolone  ointment (MYCOLOG) Apply 1 Application topically 2 (two) times daily.   ondansetron  (ZOFRAN ) 8 MG tablet Take by mouth.   pantoprazole (PROTONIX) 40 MG tablet Take by mouth.   predniSONE  (DELTASONE ) 10 MG tablet Take one tablet (10mg ) twice daily for six days days, then one tablet (10mg ) once daily for six days, then STOP.   rizatriptan  (MAXALT -MLT) 10 MG disintegrating tablet Take 1 tablet (10 mg total) by mouth as needed for migraine. May repeat in 2 hours if needed. Max dose 2 pills in 24 hours   tiZANidine (ZANAFLEX) 4 MG tablet    traMADol  (ULTRAM ) 50 MG tablet Take 50 mg by mouth 2 (two) times daily as needed.   TRULANCE 3 MG TABS Take by mouth.   Vitamin D , Ergocalciferol , (DRISDOL ) 1.25 MG (50000 UNIT) CAPS capsule Take 1 capsule (50,000 Units total) by mouth every 7 (seven) days.   No facility-administered medications prior to visit.    Review of Systems  Last metabolic panel Lab Results  Component Value Date   GLUCOSE 78 03/21/2023   NA 142 03/21/2023   K 4.1 03/21/2023   CL 104 03/21/2023   CO2 19 (L)  03/21/2023   BUN 6 03/21/2023   CREATININE 0.77 03/21/2023   EGFR 97 03/21/2023   CALCIUM 8.9 03/21/2023   PROT 6.7 03/21/2023   ALBUMIN 3.9 03/21/2023   LABGLOB 2.8 03/21/2023   AGRATIO 1.4 07/28/2021   BILITOT <0.2 03/21/2023   ALKPHOS 137 (H) 03/21/2023   AST 17 03/21/2023   ALT 14 03/21/2023   ANIONGAP 13 01/25/2023   Last lipids Lab Results  Component Value Date   CHOL CANCELED 06/14/2022   HDL CANCELED 06/14/2022   LDLCALC 102 (H) 07/28/2021   TRIG CANCELED 06/14/2022   CHOLHDL 3.1 07/28/2021   Last hemoglobin A1c Lab Results  Component Value Date   HGBA1C 5.5 03/21/2023   Last thyroid  functions Lab Results  Component Value Date   TSH 5.340 (H) 03/21/2023  Objective    LMP 05/26/2023   BP Readings from Last 3 Encounters:  04/04/23 122/78  03/29/23 (!) 132/92  03/22/23 123/78   Wt Readings from Last 3 Encounters:  04/04/23 263 lb 3.2 oz (119.4 kg)  03/29/23 260 lb (117.9 kg)  03/22/23 261 lb (118.4 kg)        Physical Exam Constitutional:      General: She is not in acute distress.    Appearance: Normal appearance. She is ill-appearing.  Neurological:     Mental Status: She is alert.  Psychiatric:        Attention and Perception: Attention normal.        Mood and Affect: Mood normal. Mood is not anxious or depressed. Affect is not tearful.        Speech: Speech normal.        Behavior: Behavior normal. Behavior is cooperative.     Comments: Normal eye contact         Assessment & Plan     Problem List Items Addressed This Visit       Cardiovascular and Mediastinum   Chronic migraine without aura, intractable, with status migrainosus - Primary   Relevant Medications   divalproex (DEPAKOTE) 250 MG DR tablet     Other   Seizure (HCC)   Relevant Medications   divalproex (DEPAKOTE) 250 MG DR tablet   Other Visit Diagnoses       Frequent falls         Balance problems         Nausea            Assessment &  Plan Migraine with nausea and vomiting Experiencing a severe migraine with associated nausea and vomiting, preventing her from leaving home. She follows with neurology and allergy /immunology for migraines and is on a regimen including Elavil , Botox  injections, Zofran , Zanaflex, and Depakote. Rizatriptan  and Odexatron have reduced symptom severity. Nausea decreased from 10/10 to 6-7/10 after Zofran . Discussed potential use of Phenergan  and Ativan for symptom management. Prednisone  is available and has been used in the past for migraines. Neurology follow-up scheduled for tomorrow. - Prescribe Phenergan  25 mg for nausea, to be taken every 6 hours as needed. - Instruct to take 20 mg of prednisone  today to help alleviate symptoms. - Continue Zofran  8 mg as needed for nausea. - Follow up with neurology as scheduled.  Fall with knee and shoulder injury Experienced a fall approximately ten days ago, resulting in a bruised and scraped knee and shoulder. Reports feeling off balance and nauseous at the time of the fall, similar to current symptoms. Attending physical therapy to strengthen core and hips to prevent future falls. Injuries assessed as mild and healing well, with no significant hematoma observed. - Continue physical therapy to strengthen core and hips.  Follow-up Scheduled for a follow-up appointment in 22 days for annual physical and thyroid  lab review. Neurology appointment scheduled for tomorrow for further migraine evaluation. - Attend follow-up appointment in 22 days for annual physical and thyroid  lab review. - Follow up with neurology appointment tomorrow.   Following close of visit, patient sent MyChart message requesting XL size for incontinence supplies stating that 2X was no longer keeping her secure, she still suffers from urinary incontinence and benefits from supplies provided by [facility]   No follow-ups on file.     I discussed the assessment and treatment plan with  the patient. The patient was provided an opportunity to ask questions and all were  answered. The patient agreed with the plan and demonstrated an understanding of the instructions.   The patient was advised to call back or seek an in-person evaluation if the symptoms worsen or if the condition fails to improve as anticipated.  I provided 25 minutes of non-face-to-face time during this encounter.   Mimi Alt, MD [facility] [phone] (phone) [phone] (fax)  [facility]

## 2023-06-27 ENCOUNTER — Ambulatory Visit: Admitting: Physical Therapy

## 2023-06-27 DIAGNOSIS — G43711 Chronic migraine without aura, intractable, with status migrainosus: Secondary | ICD-10-CM | POA: Diagnosis not present

## 2023-06-27 DIAGNOSIS — G4733 Obstructive sleep apnea (adult) (pediatric): Secondary | ICD-10-CM | POA: Diagnosis not present

## 2023-06-27 NOTE — Patient Instructions (Incomplete)
 Allergic rhinitis Continue allergen avoidance measures directed toward grass pollen, weed pollen, and tree pollen as listed below Continue Xyzal  5 mg once a day if needed for runny nose or itch you may take an additional dose of Xyzal  5 mg once a day if needed for breakthrough symptoms Continue Flonase  2 sprays in each nostril once a day if needed for stuffy nose.  In the right nostril, point the applicator out toward the right ear. In the left nostril, point the applicator out toward the left ear Consider saline nasal rinses as needed for nasal symptoms. Use this before any medicated nasal sprays for best result Consider allergen immunotherapy if your symptoms are not well-controlled with the treatment plan as listed above  Allergic conjunctivitis Some over the counter eye drops include Pataday  one drop in each eye once a day as needed for red, itchy eyes OR Zaditor one drop in each eye twice a day as needed for red itchy eyes. Avoid eye drops that say red eye relief as they may contain medications that dry out your eyes.   Food intolerance Continue to avoid foods that cause bothersome symptoms including nuts, turmeric, and mango  Call the clinic if this treatment plan is not working well for you  Follow up in *** or sooner if needed.  Reducing Pollen Exposure The American Academy of Allergy , Asthma and Immunology suggests the following steps to reduce your exposure to pollen during allergy  seasons. Do not hang sheets or clothing out to dry; pollen may collect on these items. Do not mow lawns or spend time around freshly cut grass; mowing stirs up pollen. Keep windows closed at night.  Keep car windows closed while driving. Minimize morning activities outdoors, a time when pollen counts are usually at their highest. Stay indoors as much as possible when pollen counts or humidity is high and on windy days when pollen tends to remain in the air longer. Use air conditioning when possible.   Many air conditioners have filters that trap the pollen spores. Use a HEPA room air filter to remove pollen form the indoor air you breathe.

## 2023-06-27 NOTE — Progress Notes (Signed)
   [address] [place] [address] Dept: [phone]  FOLLOW UP NOTE  Patient ID: Theresa Phillips, female    DOB: 1980-11-14  Age: 43 y.o. MRN: [id number] Date of Office Visit: 06/28/2023  Assessment  Chief Complaint: No chief complaint on file.  HPI Theresa Phillips is a 43 year old female who presents to the clinic for follow-up visit.  She was last seen in this clinic on 04/11/2023 by Dr. Lydia Sams for evaluation of allergic rhinitis and food intolerance.  Her last environmental allergy  skin testing was on 04/11/2023 and was positive to tree pollen, grass pollen, and weed pollen.  Discussed the use of AI scribe software for clinical note transcription with the patient, who gave verbal consent to proceed.  History of Present Illness      Drug Allergies:  Allergies  Allergen Reactions   Lanolin Itching and Other (See Comments)   Penicillins Other (See Comments) and Rash   Almond Oil Other (See Comments)   Ca Phosphate-Cholecalciferol Other (See Comments)   Cheese Other (See Comments)   Guaifenesin Other (See Comments)    She is allergic to the capsule not the medication     Methocarbamol Other (See Comments)   Naproxen Other (See Comments)    Precipitates migraines but other NSAIDS ok   Omeprazole Other (See Comments)    headache   Other Other (See Comments)   Peanut (Diagnostic) Other (See Comments)   Peanut Allergen Powder-Dnfp Other (See Comments)   Peanut-Containing Drug Products Other (See Comments)   Pork Allergy  Other (See Comments)   Pork-Derived Products Other (See Comments)    She is allergic to all capsule    Topiramate Other (See Comments)    Tingling in hands and feet   Carbamazepine Itching    Just the chewable tablets   Clindamycin /Lincomycin Itching   Oxycodone Itching   Tylenol [Acetaminophen] Other (See Comments)    Gives me "migraines".  States she can take it mixed with hydrocodone .      Physical Exam: LMP 05/26/2023    Physical  Exam  Diagnostics:    Assessment and Plan: No diagnosis found.  No orders of the defined types were placed in this encounter.   There are no Patient Instructions on file for this visit.  No follow-ups on file.    Thank you for the opportunity to care for this patient.  Please do not hesitate to contact me with questions.  Marinus Sic, FNP [facility]

## 2023-06-28 ENCOUNTER — Ambulatory Visit (INDEPENDENT_AMBULATORY_CARE_PROVIDER_SITE_OTHER): Admitting: Family Medicine

## 2023-06-28 ENCOUNTER — Other Ambulatory Visit: Payer: Self-pay

## 2023-06-28 ENCOUNTER — Encounter: Payer: Self-pay | Admitting: Family Medicine

## 2023-06-28 VITALS — BP 110/78 | HR 91 | Temp 98.1°F | Resp 16

## 2023-06-28 DIAGNOSIS — J301 Allergic rhinitis due to pollen: Secondary | ICD-10-CM | POA: Diagnosis not present

## 2023-06-28 DIAGNOSIS — K9049 Malabsorption due to intolerance, not elsewhere classified: Secondary | ICD-10-CM

## 2023-06-28 DIAGNOSIS — H1013 Acute atopic conjunctivitis, bilateral: Secondary | ICD-10-CM

## 2023-06-28 MED ORDER — FLUTICASONE PROPIONATE 93 MCG/ACT NA EXHU: 1.0000 | INHALANT_SUSPENSION | Freq: Two times a day (BID) | NASAL | 5 refills | Status: DC

## 2023-06-28 MED ORDER — LEVOCETIRIZINE DIHYDROCHLORIDE 5 MG PO TABS: 5.0000 mg | ORAL_TABLET | Freq: Every day | ORAL | 0 refills | Status: DC

## 2023-06-28 MED ORDER — AZELASTINE HCL 0.05 % OP SOLN: 1.0000 [drp] | Freq: Two times a day (BID) | OPHTHALMIC | 5 refills | Status: DC | PRN

## 2023-07-02 ENCOUNTER — Encounter: Payer: Self-pay | Admitting: Family Medicine

## 2023-07-02 ENCOUNTER — Encounter: Payer: Self-pay | Admitting: Obstetrics and Gynecology

## 2023-07-02 ENCOUNTER — Ambulatory Visit: Attending: Family Medicine

## 2023-07-02 DIAGNOSIS — M25551 Pain in right hip: Secondary | ICD-10-CM | POA: Insufficient documentation

## 2023-07-02 DIAGNOSIS — R262 Difficulty in walking, not elsewhere classified: Secondary | ICD-10-CM | POA: Insufficient documentation
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
## 2023-07-04 ENCOUNTER — Ambulatory Visit

## 2023-07-04 DIAGNOSIS — M25551 Pain in right hip: Secondary | ICD-10-CM

## 2023-07-04 DIAGNOSIS — R262 Difficulty in walking, not elsewhere classified: Secondary | ICD-10-CM
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
## 2023-07-05 DIAGNOSIS — I10 Essential (primary) hypertension: Secondary | ICD-10-CM | POA: Diagnosis not present

## 2023-07-05 DIAGNOSIS — N319 Neuromuscular dysfunction of bladder, unspecified: Secondary | ICD-10-CM | POA: Diagnosis not present

## 2023-07-05 DIAGNOSIS — R32 Unspecified urinary incontinence: Secondary | ICD-10-CM | POA: Diagnosis not present

## 2023-07-05 NOTE — Telephone Encounter (Signed)
 Called and spoke to Kaka from [employer], per the pt and provider the pt undergarment size has been update to an XL. Shyanne stated she would fax over the new order form today

## 2023-07-09 ENCOUNTER — Ambulatory Visit: Admitting: Physical Therapy

## 2023-07-09 DIAGNOSIS — M25551 Pain in right hip: Secondary | ICD-10-CM | POA: Diagnosis not present

## 2023-07-09 DIAGNOSIS — R262 Difficulty in walking, not elsewhere classified: Secondary | ICD-10-CM
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
## 2023-07-11 ENCOUNTER — Ambulatory Visit: Admitting: Physical Therapy

## 2023-07-11 ENCOUNTER — Encounter: Payer: Self-pay | Admitting: Physical Therapy

## 2023-07-11 DIAGNOSIS — M25551 Pain in right hip: Secondary | ICD-10-CM

## 2023-07-11 DIAGNOSIS — R262 Difficulty in walking, not elsewhere classified: Secondary | ICD-10-CM
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
## 2023-07-13 DIAGNOSIS — M25561 Pain in right knee: Secondary | ICD-10-CM | POA: Diagnosis not present

## 2023-07-13 DIAGNOSIS — G8929 Other chronic pain: Secondary | ICD-10-CM | POA: Diagnosis not present

## 2023-07-13 DIAGNOSIS — M25562 Pain in left knee: Secondary | ICD-10-CM | POA: Diagnosis not present

## 2023-07-16 ENCOUNTER — Encounter: Payer: Self-pay | Admitting: Obstetrics and Gynecology

## 2023-07-16 ENCOUNTER — Ambulatory Visit: Admitting: Physical Therapy

## 2023-07-16 ENCOUNTER — Ambulatory Visit: Admitting: Obstetrics and Gynecology

## 2023-07-16 VITALS — BP 111/78 | HR 101 | Ht 63.0 in | Wt 266.0 lb

## 2023-07-16 DIAGNOSIS — Z01419 Encounter for gynecological examination (general) (routine) without abnormal findings: Secondary | ICD-10-CM | POA: Diagnosis not present

## 2023-07-16 DIAGNOSIS — Z1331 Encounter for screening for depression: Secondary | ICD-10-CM

## 2023-07-16 MED ORDER — NORETHINDRONE 0.35 MG PO TABS: 1.0000 | ORAL_TABLET | Freq: Every day | ORAL | 11 refills | Status: AC

## 2023-07-16 NOTE — Progress Notes (Signed)
 43 y.o. GYN presents for AEX/PAP.  Last Mammogram 06/14/23  Negative

## 2023-07-16 NOTE — Progress Notes (Signed)
  History:  Ms. Theresa Phillips is a 43 y.o. G0P0000 with LMO 07/14/23 and BMI 47 who presents to clinic today for annual wellness exam. Patient reports a monthly cycle light in flow without dysmenorrhea  On interview, patient reports that she is doing well. She denies any vaginal bleeding, vaginal pain, or discharge. She endorses occasional left-sided pelvic pain that radiates towards her groin. Pain seems to occur 1 week prior to onset of menses and does not happen every month. She denies being sexually active currently. She reports no other concerns today. Patient is no longer trying to conceive at this time. She denies abnormal discharge or urinary incontinence. Patient is being followed by a neurologist for chronic migraines  The following portions of the patient's history were reviewed and updated as appropriate: allergies, current medications, family history, past medical history, social history, past surgical history and problem list.  Review of Systems:  ROS See pertinent in HPI. All other systems reviewed and non contributory   Objective:  Physical Exam BP 111/78   Pulse (!) 101   Ht 5' 3 (1.6 m)   Wt 266 lb (120.7 kg)   LMP 07/14/2023 (Exact Date)   BMI 47.12 kg/m  GENERAL: Well-developed, well-nourished female in no acute distress.  HEENT: Normocephalic, atraumatic. Sclerae anicteric.  NECK: Supple. Normal thyroid .  LUNGS: Clear to auscultation bilaterally.  HEART: Regular rate and rhythm. BREASTS: Symmetric in size. No palpable masses or lymphadenopathy, skin changes, or nipple drainage. ABDOMEN: Soft, nontender, nondistended. No organomegaly. PELVIC: Normal external female genitalia. Vagina is pink and rugated.  Normal discharge. Normal appearing cervix. Uterus is normal in size. No adnexal mass or tenderness. Bimanual exam limited secondary to body habitus. Chaperone present during the pelvic exam EXTREMITIES: No cyanosis, clubbing, or edema, 2+ distal pulses.   Labs and  Imaging No results found for this or any previous visit (from the past 24 hours).  No results found.  Health Maintenance Due  Topic Date Due   HPV VACCINES (1 - Risk 3-dose SCDM series) Never done   COVID-19 Vaccine (4 - 2024-25 season) 09/24/2022    Labs, imaging and previous visits in Epic and Care Everywhere reviewed  Assessment & Plan:  1. Women's annual routine gynecological examination [Z01.419] (Primary) - Healthy female exam - Pap smear collected today - Mammogram completed 05/25 - Patient had a colonoscopy and endoscopy 2 years ago - Patient scheduled for health maintenance labs this week with PCP - Patient will be contacted with abnormal results  Approximately 20 minutes of total time was spent with this patient on 07/16/23.  Return in about 1 year (around 07/15/2024).  Alger Gong, MD 07/16/2023 4:56 PM

## 2023-07-18 ENCOUNTER — Encounter: Admitting: Family Medicine

## 2023-07-18 ENCOUNTER — Encounter: Payer: Self-pay | Admitting: Family Medicine

## 2023-07-18 ENCOUNTER — Encounter: Admitting: Physical Therapy

## 2023-07-18 ENCOUNTER — Telehealth: Payer: Self-pay | Admitting: Family Medicine

## 2023-07-18 DIAGNOSIS — K581 Irritable bowel syndrome with constipation: Secondary | ICD-10-CM

## 2023-07-18 DIAGNOSIS — Z13220 Encounter for screening for lipoid disorders: Secondary | ICD-10-CM

## 2023-07-18 DIAGNOSIS — Z131 Encounter for screening for diabetes mellitus: Secondary | ICD-10-CM

## 2023-07-18 DIAGNOSIS — Z0000 Encounter for general adult medical examination without abnormal findings: Secondary | ICD-10-CM | POA: Insufficient documentation

## 2023-07-18 DIAGNOSIS — K2950 Unspecified chronic gastritis without bleeding: Secondary | ICD-10-CM

## 2023-07-18 DIAGNOSIS — E66813 Obesity, class 3: Secondary | ICD-10-CM

## 2023-07-18 DIAGNOSIS — Z130 Encounter for screening for diseases of the blood and blood-forming organs and certain disorders involving the immune mechanism: Secondary | ICD-10-CM

## 2023-07-18 DIAGNOSIS — E559 Vitamin D deficiency, unspecified: Secondary | ICD-10-CM

## 2023-07-18 NOTE — Patient Instructions (Incomplete)
 It was a pleasure to see you today!  Thank you for choosing [facility] for your primary care.   Today you were seen for your annual physical  Please review the attached information regarding helpful preventive health topics.   To keep you healthy, please keep in mind the following health maintenance items that you are due for:   Health Maintenance Due  Topic Date Due   Hepatitis B Vaccines (1 of 3 - 19+ 3-dose series) Never done   HPV VACCINES (1 - Risk 3-dose SCDM series) Never done   COVID-19 Vaccine (4 - 2024-25 season) 09/24/2022     Best Wishes,   Dr. Lang

## 2023-07-18 NOTE — Telephone Encounter (Signed)
 Patient came in for appt today that was scheduled at 8:40am however after checking her in it was brought to my attention by that MA that she was 14 mins late which is past BFP 10 min grace period. I called the patient back to the window and explained that to her and she stated that I was yelling and being rude but I stated very calmly that I was simply making sure I was projecting so she could understand what I was saying thru the glass at the front desk. I told her I could reschedule her but she stated she had called ahead and spoke with someone because she overslept and that she was getting off the hwy and she pulled in the parking lot at 8:50 she did not remember the name of who she spoke with though and then came in and up the elevator which I saw her downstairs catching the elevator as I had grabbed something to drink at the vending machine which there was nobody currently at my window. She also got upset because I called her Ms. Towana and she stated she is a Dr. I asked her would she like for me to change that in the system and she stated yes so I made the adjustment and told her have a good day nicely as she walked away.
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
## 2023-07-18 NOTE — Assessment & Plan Note (Deleted)
 Chronic conditions are stable  Patient was counseled on benefits of regular physical activity with goal of 150 minutes of moderate to vigurous intensity 4 days per week  Patient was counseled to consume well balanced diet of fruits, vegetables, limited saturated fats and limited sugary foods and beverages with emphasis on consuming 6-8 glasses of water daily  Screening recommended today: A1c, lipids,CMP,CBC  Colon cancer screening: recommended starting 2026 after 45th birthday   Cervical CA screening: UTD collected by GYN 07/16/23   Mammogram: completd 05/2023    Lung CA screening CT: N/A    Vaccines recommended today: COVID,Hep B vaccine, HPV

## 2023-07-19 ENCOUNTER — Ambulatory Visit: Admitting: Physical Therapy

## 2023-07-19 DIAGNOSIS — M25551 Pain in right hip: Secondary | ICD-10-CM

## 2023-07-19 DIAGNOSIS — R262 Difficulty in walking, not elsewhere classified: Secondary | ICD-10-CM
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
## 2023-07-20 DIAGNOSIS — I10 Essential (primary) hypertension: Secondary | ICD-10-CM | POA: Diagnosis not present

## 2023-07-20 DIAGNOSIS — R32 Unspecified urinary incontinence: Secondary | ICD-10-CM | POA: Diagnosis not present

## 2023-07-20 DIAGNOSIS — N319 Neuromuscular dysfunction of bladder, unspecified: Secondary | ICD-10-CM | POA: Diagnosis not present

## 2023-07-23 ENCOUNTER — Ambulatory Visit: Payer: Self-pay | Admitting: Obstetrics & Gynecology

## 2023-07-23 LAB — CYTOLOGY - PAP
Adequacy: ABNORMAL
Comment: NEGATIVE

## 2023-07-24 ENCOUNTER — Ambulatory Visit: Admitting: Physical Therapy

## 2023-07-24 ENCOUNTER — Encounter: Payer: Self-pay | Admitting: Family Medicine

## 2023-07-24 DIAGNOSIS — R600 Localized edema: Secondary | ICD-10-CM

## 2023-07-25 DIAGNOSIS — G43711 Chronic migraine without aura, intractable, with status migrainosus: Secondary | ICD-10-CM | POA: Diagnosis not present

## 2023-07-26 ENCOUNTER — Ambulatory Visit: Admitting: Physical Therapy

## 2023-07-26 DIAGNOSIS — Z131 Encounter for screening for diabetes mellitus: Secondary | ICD-10-CM | POA: Diagnosis not present

## 2023-07-26 DIAGNOSIS — K2950 Unspecified chronic gastritis without bleeding: Secondary | ICD-10-CM | POA: Diagnosis not present

## 2023-07-26 DIAGNOSIS — E559 Vitamin D deficiency, unspecified: Secondary | ICD-10-CM | POA: Diagnosis not present

## 2023-07-26 DIAGNOSIS — K581 Irritable bowel syndrome with constipation: Secondary | ICD-10-CM | POA: Diagnosis not present

## 2023-07-26 DIAGNOSIS — E66813 Obesity, class 3: Secondary | ICD-10-CM | POA: Diagnosis not present

## 2023-07-26 DIAGNOSIS — Z13220 Encounter for screening for lipoid disorders: Secondary | ICD-10-CM | POA: Diagnosis not present

## 2023-07-26 DIAGNOSIS — Z130 Encounter for screening for diseases of the blood and blood-forming organs and certain disorders involving the immune mechanism: Secondary | ICD-10-CM | POA: Diagnosis not present

## 2023-07-27 LAB — TSH+T4F+T3FREE
Free T4: 0.76 ng/dL — ABNORMAL LOW (ref 0.82–1.77)
T3, Free: 2.7 pg/mL (ref 2.0–4.4)
TSH: 4.34 u[IU]/mL (ref 0.450–4.500)

## 2023-07-27 LAB — CMP14+EGFR
ALT: 21 IU/L (ref 0–32)
AST: 23 IU/L (ref 0–40)
Albumin: 3.9 g/dL (ref 3.9–4.9)
Alkaline Phosphatase: 105 IU/L (ref 44–121)
BUN/Creatinine Ratio: 11 (ref 9–23)
BUN: 8 mg/dL (ref 6–24)
Bilirubin Total: <0.2 mg/dL (ref 0.0–1.2)
CO2: 18 mmol/L — ABNORMAL LOW (ref 20–29)
Calcium: 9 mg/dL (ref 8.7–10.2)
Chloride: 104 mmol/L (ref 96–106)
Creatinine, Ser: 0.71 mg/dL (ref 0.57–1.00)
Globulin, Total: 2.5 g/dL (ref 1.5–4.5)
Glucose: 101 mg/dL — ABNORMAL HIGH (ref 70–99)
Potassium: 3.7 mmol/L (ref 3.5–5.2)
Sodium: 137 mmol/L (ref 134–144)
Total Protein: 6.4 g/dL (ref 6.0–8.5)
eGFR: 107 mL/min/1.73 (ref >59)

## 2023-07-27 LAB — LIPID PANEL
Chol/HDL Ratio: 3.7 ratio (ref 0.0–4.4)
Cholesterol, Total: 228 mg/dL — ABNORMAL HIGH (ref 100–199)
HDL: 62 mg/dL (ref >39)
LDL Chol Calc (NIH): 147 mg/dL — ABNORMAL HIGH (ref 0–99)
Triglycerides: 107 mg/dL (ref 0–149)
VLDL Cholesterol Cal: 19 mg/dL (ref 5–40)

## 2023-07-27 LAB — CBC
Hematocrit: 38.6 % (ref 34.0–46.6)
Hemoglobin: 12.7 g/dL (ref 11.1–15.9)
MCH: 29.1 pg (ref 26.6–33.0)
MCHC: 32.9 g/dL (ref 31.5–35.7)
MCV: 89 fL (ref 79–97)
Platelets: 269 x10E3/uL (ref 150–450)
RBC: 4.36 x10E6/uL (ref 3.77–5.28)
RDW: 13.8 % (ref 11.7–15.4)
WBC: 4.7 x10E3/uL (ref 3.4–10.8)

## 2023-07-27 LAB — VITAMIN D 25 HYDROXY (VIT D DEFICIENCY, FRACTURES): Vit D, 25-Hydroxy: 27.6 ng/mL — ABNORMAL LOW (ref 30.0–100.0)

## 2023-07-27 LAB — HEMOGLOBIN A1C
Est. average glucose Bld gHb Est-mCnc: 108 mg/dL
Hgb A1c MFr Bld: 5.4 % (ref 4.8–5.6)

## 2023-07-30 ENCOUNTER — Ambulatory Visit: Admitting: Physical Therapy

## 2023-07-30 NOTE — Addendum Note (Signed)
 Addended by: IVA MARTY SALTNESS on: 07/30/2023 04:32 PM   Modules accepted: Orders

## 2023-08-02 ENCOUNTER — Encounter: Admitting: Physical Therapy

## 2023-08-06 ENCOUNTER — Encounter: Admitting: Physical Therapy

## 2023-08-06 MED ORDER — PREDNISONE 10 MG PO TABS: ORAL_TABLET | ORAL | 0 refills | Status: AC

## 2023-08-06 NOTE — Addendum Note (Signed)
 Addended by: IVA MARTY SALTNESS on: 08/06/2023 04:52 PM   Modules accepted: Orders

## 2023-08-07 DIAGNOSIS — G4733 Obstructive sleep apnea (adult) (pediatric): Secondary | ICD-10-CM | POA: Diagnosis not present

## 2023-08-07 DIAGNOSIS — H9312 Tinnitus, left ear: Secondary | ICD-10-CM | POA: Diagnosis not present

## 2023-08-07 DIAGNOSIS — G43E01 Chronic migraine with aura, not intractable, with status migrainosus: Secondary | ICD-10-CM | POA: Diagnosis not present

## 2023-08-08 ENCOUNTER — Ambulatory Visit (INDEPENDENT_AMBULATORY_CARE_PROVIDER_SITE_OTHER): Admitting: Family Medicine

## 2023-08-08 ENCOUNTER — Encounter: Payer: Self-pay | Admitting: Family Medicine

## 2023-08-08 ENCOUNTER — Ambulatory Visit: Payer: Self-pay | Admitting: Family Medicine

## 2023-08-08 VITALS — BP 121/87 | HR 104 | Ht 63.0 in | Wt 265.0 lb

## 2023-08-08 DIAGNOSIS — Z0000 Encounter for general adult medical examination without abnormal findings: Secondary | ICD-10-CM

## 2023-08-08 DIAGNOSIS — R600 Localized edema: Secondary | ICD-10-CM | POA: Diagnosis not present

## 2023-08-08 DIAGNOSIS — G43711 Chronic migraine without aura, intractable, with status migrainosus: Secondary | ICD-10-CM | POA: Diagnosis not present

## 2023-08-08 MED ORDER — PROMETHAZINE HCL 25 MG PO TABS: 25.0000 mg | ORAL_TABLET | Freq: Three times a day (TID) | ORAL | 1 refills | Status: AC | PRN
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
## 2023-08-08 NOTE — Patient Instructions (Signed)
 It was a pleasure to see you today!  Thank you for choosing [facility] for your primary care.   Today you were seen for your annual physical  Please review the attached information regarding helpful preventive health topics.   To keep you healthy, please keep in mind the following health maintenance items that you are due for:   Health Maintenance Due  Topic Date Due   Hepatitis B Vaccines (1 of 3 - 19+ 3-dose series) Never done   HPV VACCINES (1 - Risk 3-dose SCDM series) Never done   COVID-19 Vaccine (4 - 2024-25 season) 09/24/2022     Best Wishes,   Dr. Lang

## 2023-08-09 ENCOUNTER — Encounter: Admitting: Physical Therapy

## 2023-08-13 ENCOUNTER — Encounter: Admitting: Physical Therapy

## 2023-08-15 LAB — C3 AND C4
Complement C3, Serum: 159 mg/dL (ref 82–167)
Complement C4, Serum: 35 mg/dL (ref 12–38)

## 2023-08-15 LAB — C1 ESTERASE INHIBITOR: C1INH SerPl-mCnc: 31 mg/dL (ref 21–39)

## 2023-08-15 LAB — COMPLEMENT COMPONENT C1Q: Complement C1Q: 17.7 mg/dL (ref 10.3–20.5)

## 2023-08-15 LAB — C1 ESTERASE INHIBITOR, FUNCTIONAL: C1INH Functional/C1INH Total MFr SerPl: 99 %{normal}

## 2023-08-16 ENCOUNTER — Encounter: Admitting: Physical Therapy

## 2023-08-17 ENCOUNTER — Other Ambulatory Visit (HOSPITAL_COMMUNITY)
Admission: RE | Admit: 2023-08-17 | Discharge: 2023-08-17 | Disposition: A | Discharge status: Home / Self Care | Source: Ambulatory Visit | Attending: Obstetrics and Gynecology | Admitting: Obstetrics and Gynecology

## 2023-08-17 ENCOUNTER — Encounter: Payer: Self-pay | Admitting: Obstetrics and Gynecology

## 2023-08-17 ENCOUNTER — Ambulatory Visit: Admitting: Obstetrics and Gynecology

## 2023-08-17 VITALS — BP 126/87 | HR 111 | Wt 265.0 lb

## 2023-08-17 DIAGNOSIS — Z124 Encounter for screening for malignant neoplasm of cervix: Secondary | ICD-10-CM | POA: Diagnosis not present

## 2023-08-17 NOTE — Progress Notes (Signed)
 43 yo P0 here for repeat pap smear as previous one in June was unsatisfactory. Patient is without any new complaints  Blood pressure 126/87, pulse (!) 111, weight 265 lb (120.2 kg), last menstrual period 08/05/2023. GENERAL: Well-developed, well-nourished female in no acute distress.  PELVIC: Normal external female genitalia. Vagina is pink and rugated.  Normal discharge. Normal appearing cervix. Uterus is normal in size. No adnexal mass or tenderness. Chaperone present during the pelvic exam EXTREMITIES: No cyanosis, clubbing, or edema, 2+ distal pulses.  A/P 43 yo here for repeat pap smear - Pap smear collected - Patient will be contacted with abnormal results

## 2023-08-20 ENCOUNTER — Encounter: Admitting: Physical Therapy

## 2023-08-21 ENCOUNTER — Ambulatory Visit: Payer: Self-pay | Admitting: Allergy & Immunology

## 2023-08-22 ENCOUNTER — Encounter: Admitting: Physical Therapy

## 2023-08-24 LAB — CYTOLOGY - PAP
Adequacy: ABSENT
Comment: NEGATIVE
Diagnosis: NEGATIVE
Diagnosis: REACTIVE
High risk HPV: NEGATIVE

## 2023-09-04 ENCOUNTER — Telehealth: Payer: Self-pay

## 2023-09-04 MED ORDER — AZELASTINE HCL 0.05 % OP SOLN: OPHTHALMIC | 5 refills | Status: DC

## 2023-09-04 NOTE — Telephone Encounter (Signed)
 Patient called in  - DOB/DPR/Pharmacy verified - requested medication refill on Azelastine  (Optivar ) 0.05% due to how often she's using drops.  Patient advised will try sending in new Rx with increased quantity to see if insurance will approve.  Patient verbalized understanding to all, no further questions.

## 2023-09-08 ENCOUNTER — Other Ambulatory Visit: Payer: Self-pay | Admitting: Family Medicine

## 2023-09-08 DIAGNOSIS — E559 Vitamin D deficiency, unspecified: Secondary | ICD-10-CM

## 2023-09-10 ENCOUNTER — Ambulatory Visit: Payer: Self-pay | Admitting: Family Medicine

## 2023-09-10 ENCOUNTER — Ambulatory Visit (INDEPENDENT_AMBULATORY_CARE_PROVIDER_SITE_OTHER): Admitting: Family Medicine

## 2023-09-10 ENCOUNTER — Encounter: Payer: Self-pay | Admitting: Family Medicine

## 2023-09-10 ENCOUNTER — Ambulatory Visit
Admission: RE | Admit: 2023-09-10 | Discharge: 2023-09-10 | Disposition: A | Discharge status: Home / Self Care | Source: Ambulatory Visit | Attending: Family Medicine

## 2023-09-10 ENCOUNTER — Ambulatory Visit
Admission: RE | Admit: 2023-09-10 | Discharge: 2023-09-10 | Disposition: A | Discharge status: Home / Self Care | Attending: Family Medicine | Admitting: Family Medicine

## 2023-09-10 VITALS — BP 128/87 | HR 96 | Resp 16 | Ht 63.0 in | Wt 261.9 lb

## 2023-09-10 DIAGNOSIS — M5416 Radiculopathy, lumbar region: Secondary | ICD-10-CM | POA: Diagnosis not present

## 2023-09-10 DIAGNOSIS — W19XXXA Unspecified fall, initial encounter: Secondary | ICD-10-CM

## 2023-09-10 DIAGNOSIS — G8929 Other chronic pain: Secondary | ICD-10-CM | POA: Insufficient documentation

## 2023-09-10 DIAGNOSIS — M79604 Pain in right leg: Secondary | ICD-10-CM

## 2023-09-10 DIAGNOSIS — R296 Repeated falls: Secondary | ICD-10-CM | POA: Diagnosis not present

## 2023-09-10 DIAGNOSIS — M222X2 Patellofemoral disorders, left knee: Secondary | ICD-10-CM

## 2023-09-10 DIAGNOSIS — M5412 Radiculopathy, cervical region: Secondary | ICD-10-CM | POA: Diagnosis not present

## 2023-09-10 DIAGNOSIS — M79605 Pain in left leg: Secondary | ICD-10-CM | POA: Diagnosis not present

## 2023-09-10 DIAGNOSIS — M25562 Pain in left knee: Secondary | ICD-10-CM | POA: Insufficient documentation

## 2023-09-10 DIAGNOSIS — S40212A Abrasion of left shoulder, initial encounter: Secondary | ICD-10-CM

## 2023-09-10 DIAGNOSIS — Z9181 History of falling: Secondary | ICD-10-CM | POA: Diagnosis not present

## 2023-09-10 DIAGNOSIS — M79621 Pain in right upper arm: Secondary | ICD-10-CM | POA: Insufficient documentation

## 2023-09-10 DIAGNOSIS — M79601 Pain in right arm: Secondary | ICD-10-CM

## 2023-09-10 DIAGNOSIS — M25511 Pain in right shoulder: Secondary | ICD-10-CM | POA: Insufficient documentation

## 2023-09-10 DIAGNOSIS — M25561 Pain in right knee: Secondary | ICD-10-CM | POA: Insufficient documentation

## 2023-09-10 DIAGNOSIS — M222X1 Patellofemoral disorders, right knee: Secondary | ICD-10-CM | POA: Diagnosis not present

## 2023-09-10 NOTE — Patient Instructions (Signed)
 Please report to [facility] located at:  [address]  [place], [place] [address]  You do not need an appointment to have xrays completed.   Our office will follow up with  results once available.
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
## 2023-09-11 ENCOUNTER — Other Ambulatory Visit: Payer: Self-pay | Admitting: Family Medicine

## 2023-09-11 DIAGNOSIS — M51362 Other intervertebral disc degeneration, lumbar region with discogenic back pain and lower extremity pain: Secondary | ICD-10-CM

## 2023-09-11 DIAGNOSIS — M47816 Spondylosis without myelopathy or radiculopathy, lumbar region: Secondary | ICD-10-CM

## 2023-09-11 DIAGNOSIS — M222X1 Patellofemoral disorders, right knee: Secondary | ICD-10-CM

## 2023-09-11 DIAGNOSIS — M5431 Sciatica, right side: Secondary | ICD-10-CM

## 2023-09-11 DIAGNOSIS — M5416 Radiculopathy, lumbar region: Secondary | ICD-10-CM

## 2023-09-11 DIAGNOSIS — G8929 Other chronic pain: Secondary | ICD-10-CM

## 2023-09-11 MED ORDER — TRAMADOL HCL 50 MG PO TABS: 50.0000 mg | ORAL_TABLET | Freq: Three times a day (TID) | ORAL | 1 refills | Status: AC | PRN

## 2023-09-12 ENCOUNTER — Encounter: Payer: Self-pay | Admitting: Obstetrics and Gynecology

## 2023-09-13 ENCOUNTER — Other Ambulatory Visit: Payer: Self-pay | Admitting: Obstetrics and Gynecology

## 2023-09-13 MED ORDER — CLOTRIMAZOLE-BETAMETHASONE 1-0.05 % EX CREA: TOPICAL_CREAM | Freq: Two times a day (BID) | CUTANEOUS | 3 refills | Status: AC

## 2023-09-13 MED ORDER — NYSTATIN 100000 UNIT/GM EX OINT
1.0000 | TOPICAL_OINTMENT | Freq: Two times a day (BID) | CUTANEOUS | 3 refills | Status: AC
Start: 2023-09-13 — End: ?

## 2023-09-22 ENCOUNTER — Other Ambulatory Visit: Payer: Self-pay | Admitting: Family Medicine

## 2023-09-25 ENCOUNTER — Encounter: Payer: Self-pay | Admitting: Sports Medicine

## 2023-10-05 DIAGNOSIS — N319 Neuromuscular dysfunction of bladder, unspecified: Secondary | ICD-10-CM | POA: Diagnosis not present

## 2023-10-05 DIAGNOSIS — R32 Unspecified urinary incontinence: Secondary | ICD-10-CM | POA: Diagnosis not present

## 2023-10-05 DIAGNOSIS — I10 Essential (primary) hypertension: Secondary | ICD-10-CM | POA: Diagnosis not present

## 2023-10-08 DIAGNOSIS — G4733 Obstructive sleep apnea (adult) (pediatric): Secondary | ICD-10-CM | POA: Diagnosis not present

## 2023-10-08 DIAGNOSIS — H93A3 Pulsatile tinnitus, bilateral: Secondary | ICD-10-CM | POA: Diagnosis not present

## 2023-10-08 DIAGNOSIS — M26609 Unspecified temporomandibular joint disorder, unspecified side: Secondary | ICD-10-CM | POA: Diagnosis not present

## 2023-10-08 DIAGNOSIS — G43E11 Chronic migraine with aura, intractable, with status migrainosus: Secondary | ICD-10-CM | POA: Diagnosis not present

## 2023-10-08 DIAGNOSIS — M7918 Myalgia, other site: Secondary | ICD-10-CM | POA: Diagnosis not present

## 2023-10-08 DIAGNOSIS — M542 Cervicalgia: Secondary | ICD-10-CM | POA: Diagnosis not present

## 2023-10-08 DIAGNOSIS — G8929 Other chronic pain: Secondary | ICD-10-CM | POA: Diagnosis not present

## 2023-10-08 DIAGNOSIS — Z79899 Other long term (current) drug therapy: Secondary | ICD-10-CM | POA: Diagnosis not present

## 2023-10-08 DIAGNOSIS — G4486 Cervicogenic headache: Secondary | ICD-10-CM | POA: Diagnosis not present

## 2023-10-09 ENCOUNTER — Encounter: Payer: Self-pay | Admitting: Family Medicine

## 2023-10-26 DIAGNOSIS — G43E11 Chronic migraine with aura, intractable, with status migrainosus: Secondary | ICD-10-CM | POA: Diagnosis not present

## 2023-11-06 ENCOUNTER — Telehealth: Payer: Self-pay

## 2023-11-06 ENCOUNTER — Encounter: Payer: Self-pay | Admitting: Internal Medicine

## 2023-11-06 ENCOUNTER — Other Ambulatory Visit (HOSPITAL_COMMUNITY): Payer: Self-pay

## 2023-11-06 MED ORDER — LEVOCETIRIZINE DIHYDROCHLORIDE 5 MG PO TABS: 5.0000 mg | ORAL_TABLET | Freq: Every day | ORAL | 5 refills | Status: DC

## 2023-11-06 NOTE — Telephone Encounter (Signed)
 Pt requesting prior authorization to be renewed for Levocetirizine. States it needs to be done every 6 months.

## 2023-11-06 NOTE — Addendum Note (Signed)
 Addended by: Maize Brittingham E on: 11/06/2023 04:40 PM   Modules accepted: Orders

## 2023-11-30 ENCOUNTER — Telehealth: Payer: Self-pay

## 2023-11-30 DIAGNOSIS — M222X2 Patellofemoral disorders, left knee: Secondary | ICD-10-CM

## 2023-11-30 DIAGNOSIS — M51362 Other intervertebral disc degeneration, lumbar region with discogenic back pain and lower extremity pain: Secondary | ICD-10-CM

## 2023-11-30 DIAGNOSIS — M5416 Radiculopathy, lumbar region: Secondary | ICD-10-CM

## 2023-11-30 DIAGNOSIS — M5459 Other low back pain: Secondary | ICD-10-CM

## 2023-11-30 DIAGNOSIS — G8929 Other chronic pain: Secondary | ICD-10-CM

## 2023-11-30 DIAGNOSIS — M25511 Pain in right shoulder: Secondary | ICD-10-CM

## 2023-11-30 DIAGNOSIS — M79604 Pain in right leg: Secondary | ICD-10-CM

## 2023-11-30 DIAGNOSIS — G5603 Carpal tunnel syndrome, bilateral upper limbs: Secondary | ICD-10-CM

## 2023-11-30 DIAGNOSIS — M2550 Pain in unspecified joint: Secondary | ICD-10-CM

## 2023-11-30 NOTE — Telephone Encounter (Signed)
 Copied from CRM #[id number]. Topic: Referral - Request for Referral >> Nov 30, 2023 10:31 AM [name] wrote: Did the patient discuss referral with their provider in the last year? Yes (If No - schedule appointment) (If Yes - send message)  Appointment offered? No  Type of order/referral and detailed reason for visit: Theresa Phillips's Orthopedic doctor left the practice.  Preference of office, provider, location: She wants one in [place], [place]. She does not know any in this area.   If referral order, have you been seen by this specialty before? Yes (If Yes, this issue or another issue? When? Where?[facility]; Outpatient  Can we respond through MyChart? Yes

## 2023-11-30 NOTE — Telephone Encounter (Signed)
 Requesting referral to establish with new orthopedic provider

## 2023-11-30 NOTE — Telephone Encounter (Signed)
 Patient advised, will reach out if she does not hear from them within 2 weeks.

## 2023-12-06 ENCOUNTER — Other Ambulatory Visit: Payer: Self-pay | Admitting: Family Medicine
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
## 2023-12-10 ENCOUNTER — Ambulatory Visit

## 2023-12-10 DIAGNOSIS — S46811A Strain of other muscles, fascia and tendons at shoulder and upper arm level, right arm, initial encounter: Secondary | ICD-10-CM | POA: Diagnosis not present

## 2023-12-10 DIAGNOSIS — M25511 Pain in right shoulder: Secondary | ICD-10-CM | POA: Diagnosis not present

## 2023-12-10 DIAGNOSIS — M5010 Cervical disc disorder with radiculopathy, unspecified cervical region: Secondary | ICD-10-CM

## 2023-12-10 DIAGNOSIS — M7701 Medial epicondylitis, right elbow: Secondary | ICD-10-CM | POA: Diagnosis not present

## 2023-12-10 DIAGNOSIS — G8929 Other chronic pain: Secondary | ICD-10-CM

## 2023-12-10 NOTE — Patient Instructions (Signed)

## 2023-12-13 ENCOUNTER — Encounter: Payer: Self-pay | Admitting: Family Medicine

## 2023-12-13 DIAGNOSIS — G8929 Other chronic pain: Secondary | ICD-10-CM

## 2023-12-14 MED ORDER — IBUPROFEN 800 MG PO TABS: 800.0000 mg | ORAL_TABLET | Freq: Three times a day (TID) | ORAL | 2 refills | Status: AC | PRN

## 2023-12-19 ENCOUNTER — Other Ambulatory Visit: Payer: Self-pay | Admitting: Family Medicine

## 2023-12-19 ENCOUNTER — Encounter: Payer: Self-pay | Admitting: Family Medicine

## 2023-12-19 MED ORDER — BENZONATATE 200 MG PO CAPS: 200.0000 mg | ORAL_CAPSULE | Freq: Two times a day (BID) | ORAL | 2 refills | Status: DC | PRN

## 2023-12-21 ENCOUNTER — Other Ambulatory Visit: Payer: Self-pay | Admitting: Family Medicine

## 2023-12-21 ENCOUNTER — Telehealth: Payer: Self-pay | Admitting: Family Medicine

## 2023-12-21 MED ORDER — PROMETHAZINE-DM 6.25-15 MG/5ML PO SYRP: 5.0000 mL | ORAL_SOLUTION | Freq: Four times a day (QID) | ORAL | 0 refills | Status: DC | PRN

## 2023-12-21 NOTE — Telephone Encounter (Signed)
 Received call from nursing line requesting a PA for cough medication.  Request from [employer]. Call back # [phone] with PA request for [phone] for tessalon  perles 200mg  BID PRN prescription.  Insurance information only lists Medicaid for primary and secondary.   Will submit a different prescription for patient's cough.

## 2023-12-26 ENCOUNTER — Ambulatory Visit: Admitting: Internal Medicine

## 2023-12-28 ENCOUNTER — Ambulatory Visit: Admitting: Internal Medicine

## 2023-12-28 VITALS — BP 130/78 | HR 101 | Temp 98.2°F | Resp 20

## 2023-12-28 DIAGNOSIS — J302 Other seasonal allergic rhinitis: Secondary | ICD-10-CM | POA: Diagnosis not present

## 2023-12-28 DIAGNOSIS — J3089 Other allergic rhinitis: Secondary | ICD-10-CM

## 2023-12-28 MED ORDER — AZELASTINE HCL 0.1 % NA SOLN: 2.0000 | Freq: Two times a day (BID) | NASAL | 5 refills | Status: AC | PRN

## 2023-12-28 MED ORDER — FLUTICASONE PROPIONATE 50 MCG/ACT NA SUSP: 2.0000 | Freq: Every day | NASAL | 5 refills | Status: AC

## 2023-12-28 MED ORDER — AZELASTINE HCL 0.05 % OP SOLN: OPHTHALMIC | 5 refills | Status: AC

## 2023-12-28 MED ORDER — LEVOCETIRIZINE DIHYDROCHLORIDE 5 MG PO TABS: 5.0000 mg | ORAL_TABLET | Freq: Every day | ORAL | 5 refills | Status: AC

## 2023-12-28 NOTE — Patient Instructions (Addendum)
 Allergic Rhinitis: - SPT 03/2023: positive to trees, grasses, weeds  - Use nasal saline rinses before nose sprays such as with Neilmed Sinus Rinse as needed.  Use distilled water.   - Use Flonase  2 sprays each nostril daily. Aim upward and outward. - Use Azelastine  2 sprays each nostril twice daily as needed for congestion, drainage, runny nose, sneezing. Aim upward and outward. - Use Xyzal  5 mg daily. Okay to take second dose if needed.  - For eyes, use Olopatadine  or Ketotifen or Azelastine  1 eye drop up to twice daily as needed for itchy, watery eyes.  Available over the counter, if not covered by insurance.  - For dry eyes, use lubricating eye drops over the counter.   - Consider allergy  shots as long term control of your symptoms by teaching your immune system to be more tolerant of your allergy  triggers.    ALLERGEN AVOIDANCE MEASURES  Pollen Avoidance Pollen levels are highest during the mid-day and afternoon.  Consider this when planning outdoor activities. Avoid being outside when the grass is being mowed, or wear a mask if the pollen-allergic person must be the one to mow the grass. Keep the windows closed to keep pollen outside of the home. Use an air conditioner to filter the air. Take a shower, wash hair, and change clothing after working or playing outdoors during pollen season.

## 2023-12-28 NOTE — Progress Notes (Signed)
 FOLLOW UP Date of Service/Encounter:  12/28/23   Subjective:  Theresa Phillips (DOB: [birth year]) is a 43 y.o. female who [facility] for follow up for allergic rhinitis.   History obtained from: chart review and patient. Last seen 06/28/2023 with Theresa Phillips for controlled allergic rhinitis on Flonase , Azelastine , Xyzal ; does note some ocular symptoms, started on Optivar  eye drops.   Doing okay.  Notes improvement with medications but some runny nose and drainage.  Using Flonase  and Xyzal  daily which do help.  Rarely uses Azelastine .  Also notes prior episodes of puffy eyelids.  Sometimes also trouble with itchy eyes and using allergy  eye drops. Recently seen by eye doctor and also has issues with dry eyes.    Past Medical History: Past Medical History:  Diagnosis Date   Allergy     See list   Annual physical exam 07/18/2023   Anxiety 05/2014   Chronic pain syndrome    Clotting disorder    Jan/Feb 23   GERD (gastroesophageal reflux disease)    Migraines    Neuromuscular disorder (HCC)    PCOS (polycystic ovarian syndrome)    PTSD (post-traumatic stress disorder)    Seizures (HCC)    Sinusitis    Sleep apnea    7 years ago, CPAP   Vitamin D  deficiency     Objective:  BP 130/78 (BP Location: Right Arm, Patient Position: Sitting, Cuff Size: Large)   Pulse (!) 101   Temp 98.2 F (36.8 C) (Temporal)   Resp 20   SpO2 95%  There is no height or weight on file to calculate BMI. Physical Exam: GEN: alert, well developed HEENT: clear conjunctiva, nose with mild inferior turbinate hypertrophy, pink nasal mucosa, slight clear rhinorrhea, no cobblestoning HEART: regular rate and rhythm, no murmur LUNGS: clear to auscultation bilaterally, no coughing, unlabored respiration SKIN: no rashes or lesions  Assessment:   1. Seasonal and perennial allergic rhinitis     Plan/Recommendations:   Allergic Rhinitis: - Controlled  - SPT 03/2023:  positive to trees, grasses, weeds  - Use nasal saline rinses before nose sprays such as with Neilmed Sinus Rinse as needed.  Use distilled water.   - Use Flonase  2 sprays each nostril daily. Aim upward and outward. - Use Azelastine  2 sprays each nostril twice daily as needed for congestion, drainage, runny nose, sneezing. Aim upward and outward. - Use Xyzal  5 mg daily. Okay to take second dose if needed.  - For eyes, use Olopatadine  or Ketotifen or Azelastine  1 eye drop up to twice daily as needed for itchy, watery eyes.  Available over the counter, if not covered by insurance.  - For dry eyes, use lubricating eye drops over the counter.   - Consider allergy  shots as long term control of your symptoms by teaching your immune system to be more tolerant of your allergy  triggers.    ALLERGEN AVOIDANCE MEASURES  Pollen Avoidance Pollen levels are highest during the mid-day and afternoon.  Consider this when planning outdoor activities. Avoid being outside when the grass is being mowed, or wear a mask if the pollen-allergic person must be the one to mow the grass. Keep the windows closed to keep pollen outside of the home. Use an air conditioner to filter the air. Take a shower, wash hair, and change clothing after working or playing outdoors during pollen season.        Return in about 6 months (around 06/27/2024).  Theresa Blanch, MD  [facility] 

## 2023-12-30 DIAGNOSIS — G894 Chronic pain syndrome: Secondary | ICD-10-CM | POA: Insufficient documentation

## 2023-12-30 DIAGNOSIS — Z79899 Other long term (current) drug therapy: Secondary | ICD-10-CM | POA: Insufficient documentation

## 2023-12-30 DIAGNOSIS — M899 Disorder of bone, unspecified: Secondary | ICD-10-CM | POA: Insufficient documentation

## 2023-12-30 DIAGNOSIS — Z789 Other specified health status: Secondary | ICD-10-CM | POA: Insufficient documentation

## 2023-12-30 DIAGNOSIS — G43009 Migraine without aura, not intractable, without status migrainosus: Secondary | ICD-10-CM | POA: Insufficient documentation

## 2023-12-30 NOTE — Progress Notes (Unsigned)
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 06/29/2014   Migraine 03/20/2019   Chronic migraine without aura, intractable, with status migrainosus 12/10/2019   Multiple joint pain 06/29/2014   Numbness and tingling in left hand 01/12/2015   Obesity, Class III, BMI 40-49.9 (morbid obesity) (HCC) 04/07/2018   Orthostasis 11/27/2013   Chronic pain of both knees 06/29/2014   PCOS (polycystic ovarian syndrome) 12/09/2015   Sleep related rhythmic movement disorder 07/13/2014   Tremor 02/26/2019   Vitamin D  deficiency 11/27/2012   Acanthosis nigricans 03/05/2020   Bilateral carpal tunnel syndrome 02/13/2019   Bilateral lower  extremity pain 08/07/2014   Chronic nausea 05/13/2020   Constipation 03/05/2020   Irritable bowel syndrome 03/05/2020   Sciatica 03/05/2020   Radial styloid tenosynovitis of left hand 02/13/2019   Radial styloid tenosynovitis of right hand 02/13/2019   Recurrent boils 12/01/2020   Sensory disorder of trigeminal nerve 01/19/2021   Carpal tunnel syndrome of left wrist 04/04/2019   Carpal tunnel syndrome of right wrist 04/04/2019   Morbid obesity with BMI of 45.0-49.9, adult (HCC) 04/07/2018   Facet arthropathy, lumbar 02/17/2020   Chronic neck pain 04/19/2022   Primary osteoarthritis of right hip, right hip trochanteric bursitis and abductor tendinitis 04/19/2022   Patellofemoral syndrome, bilateral 04/19/2022   Bilateral ankle pain 04/19/2022   Fibromyalgia 11/09/2022   Allergic conjunctivitis and rhinitis, bilateral 03/14/2023   Seizure (HCC) 09/29/2022   Lumbago with sciatica,  right side 03/15/2023   Stress incontinence of urine 03/29/2023   Annual physical exam 07/18/2023   Migraine without aura and without status migrainosus, not intractable 12/30/2023   Morbid obesity with body mass index (BMI) of 40.0 to 44.9 in adult [facility]) 06/27/2023   Chronic pain syndrome 12/30/2023   Pharmacologic therapy 12/30/2023   Disorder of skeletal system 12/30/2023   Problems influencing health status 12/30/2023   Resolved Ambulatory Problems    Diagnosis Date Noted   Morbid obesity due to excess calories c/b OSA 03/01/2019   Neck pain 01/12/2015   Past Medical History:  Diagnosis Date   Allergy     Anxiety 05/2014   Clotting disorder    GERD (gastroesophageal reflux disease)    Migraines    Neuromuscular disorder (HCC)    PTSD (post-traumatic stress disorder)    Seizures (HCC)    Sinusitis    Sleep apnea    Constitutional Exam  General appearance: Well nourished, well developed, and well hydrated. In no apparent acute distress There were no vitals filed for this visit. BMI  Assessment: Estimated body mass index is 46.39 kg/m as calculated from the following:   Height as of 09/10/23: 5' 3 (1.6 m).   Weight as of 09/10/23: 261 lb 14.4 oz (118.8 kg).  BMI interpretation table: BMI level Category Range association with higher incidence of chronic pain  <18 kg/m2 Underweight   18.5-24.9 kg/m2 Ideal body weight   25-29.9 kg/m2 Overweight Increased incidence by 20%  30-34.9 kg/m2 Obese (Class I) Increased incidence by 68%  35-39.9 kg/m2 Severe obesity (Class II) Increased incidence by 136%  >40 kg/m2 Extreme obesity (Class III) Increased incidence by 254%   Patient's current BMI Ideal Body weight  There is no height or weight on file to calculate BMI. Patient weight not recorded   BMI Readings from Last 4 Encounters:  09/10/23 46.39 kg/m  08/17/23 46.94 kg/m  08/08/23 46.94 kg/m  07/16/23 47.12 kg/m   Wt Readings from Last 4 Encounters:  09/10/23 261 lb 14.4 oz (118.8 kg)  08/17/23 265 lb (120.2 kg)  08/08/23 265 lb (120.2 kg)  07/16/23 266 lb (120.7 kg)    Psych/Mental status: Alert, oriented x 3 (person, place, & time)       Eyes: PERLA Respiratory: No evidence of acute respiratory distress  Assessment  Primary Diagnosis & Pertinent Problem List: The primary encounter diagnosis was Chronic pain syndrome. Diagnoses of Pharmacologic therapy, Disorder of skeletal system, and Problems influencing health status were also pertinent to this visit.  Visit Diagnosis (New problems to examiner): 1. Chronic pain syndrome   2. Pharmacologic therapy   3. Disorder of skeletal system   4. Problems influencing health status    Plan of Care (Initial workup plan)  Note: Dr. Towana was reminded that as per protocol, today's visit has been an evaluation only. We have not taken over the patient's controlled substance management.  Problem-specific plan: Assessment and Plan            Lab Orders  No laboratory test(s) ordered today   Imaging Orders  No  imaging studies ordered today   Referral Orders  No referral(s) requested today   Procedure Orders    No procedure(s) ordered today   Pharmacotherapy (current): Medications ordered:  No orders of the defined types were placed in this encounter.  Medications administered during this visit: Dr. Graeme Towana had no medications administered during this visit.   Analgesic Pharmacotherapy:  Opioid Analgesics: For patients currently taking  or requesting to take opioid analgesics, in accordance with [place]  Medical Board Guidelines, we will assess their risks and indications for the use of these substances. After completing our evaluation, we may offer recommendations, but we no longer take patients for medication management. The prescribing physician will ultimately decide, based on his/her training and level of comfort whether to adopt any of the recommendations, including whether or not to prescribe such medicines.  Membrane stabilizer: To be determined at a later time  Muscle relaxant: To be determined at a later time  NSAID: To be determined at a later time  Other analgesic(s): To be determined at a later time   Interventional management options: Dr. Towana was informed that there is no guarantee that she would be a candidate for interventional therapies. The decision will be based on the results of diagnostic studies, as well as Dr. Thressa risk profile.  Procedure(s) under consideration:  Pending results of ordered studies     Interventional Therapies  Risk Factors  Considerations  Medical Comorbidities:     Planned  Pending:      Under consideration:   Pending   Completed: (Analgesic benefit)1  None at this time   Therapeutic  Palliative (PRN) options:   None established   Completed by other providers:   None reported  1(Analgesic benefit): Expressed in percentage (%). (Local anesthetic[LA] +/- sedation  L.A.Local Anesthetic  Steroid benefit  Ongoing  benefit)   Provider-requested follow-up: No follow-ups on file.  Future Appointments  Date Time Provider Department Center  12/31/2023  2:00 PM Tanya Glisson, MD [facility] None  01/09/2024  1:00 PM Theotis Toribio PARAS, PT [facility] None  01/21/2024  1:45 PM Theotis Toribio PARAS, PT [facility] None  01/28/2024  1:45 PM [facility] PT SUB THERAPIST 1 [facility] None  01/30/2024  3:15 PM [facility] PT SUB THERAPIST 1 [facility] None  02/04/2024 10:15 AM Gust Molly, DO [facility] None  02/04/2024  2:30 PM [facility] PT SUB THERAPIST 1 [facility] None  02/06/2024  2:30 PM [facility] PT SUB THERAPIST 1 [facility] None  02/11/2024  2:30 PM [facility] PT SUB THERAPIST 1 [facility] None  02/14/2024  2:30 PM [facility] PT SUB THERAPIST 1 [facility] None  02/19/2024  3:15 PM [facility] PT SUB THERAPIST 1 [facility] None  02/21/2024  2:30 PM [facility] PT SUB THERAPIST 1 [facility] None  06/30/2024 11:10 AM Ambs, Arlean HERO, FNP [facility] None  08/07/2024  3:00 PM Simmons-Robinson, Rockie, MD [facility]   I discussed the assessment and treatment plan with the patient. The patient was provided an opportunity to ask questions and all were answered. The patient agreed with the plan and demonstrated an understanding of the instructions.  Patient advised to call back or seek an in-person evaluation if the symptoms or condition worsens.  Duration of encounter: *** minutes.  Total time on encounter, as per AMA guidelines included both the face-to-face and non-face-to-face time personally spent by the physician and/or other qualified health care professional(s) on the day of the encounter (includes time in activities that require the physician or other qualified health care professional and does not include time in activities normally performed by clinical staff). Physician's time may include the following activities when performed: Preparing to see the patient (e.g., pre-charting review of records, searching for previously ordered imaging, lab work, and  nerve conduction tests) Review of prior analgesic pharmacotherapies. Reviewing PMP Interpreting ordered tests (e.g., lab work, imaging, nerve conduction tests) Performing post-procedure evaluations, including interpretation of diagnostic procedures Obtaining and/or reviewing separately  obtained history Performing a medically appropriate examination and/or evaluation Counseling and educating the patient/family/caregiver Ordering medications, tests, or procedures Referring and communicating with other health care professionals (when not separately reported) Documenting clinical information in the electronic or other health record Independently interpreting results (not separately reported) and communicating results to the patient/ family/caregiver Care coordination (not separately reported)  Note by: Eric DELENA Como, MD (TTS and AI technology used. I apologize for any typographical errors that were not detected and corrected.) Date: 12/31/2023; Time: 6:54 PM

## 2023-12-30 NOTE — Patient Instructions (Signed)

## 2023-12-31 ENCOUNTER — Encounter: Payer: Self-pay | Admitting: Pain Medicine

## 2023-12-31 ENCOUNTER — Ambulatory Visit: Attending: Pain Medicine | Admitting: Pain Medicine

## 2023-12-31 VITALS — BP 130/86 | HR 93 | Temp 98.4°F | Resp 16 | Ht 63.0 in | Wt 274.0 lb

## 2023-12-31 DIAGNOSIS — G894 Chronic pain syndrome: Secondary | ICD-10-CM

## 2023-12-31 DIAGNOSIS — M79604 Pain in right leg: Secondary | ICD-10-CM | POA: Diagnosis not present

## 2023-12-31 DIAGNOSIS — G8929 Other chronic pain: Secondary | ICD-10-CM | POA: Insufficient documentation

## 2023-12-31 DIAGNOSIS — M7061 Trochanteric bursitis, right hip: Secondary | ICD-10-CM | POA: Diagnosis not present

## 2023-12-31 DIAGNOSIS — M150 Primary generalized (osteo)arthritis: Secondary | ICD-10-CM | POA: Insufficient documentation

## 2023-12-31 DIAGNOSIS — M170 Bilateral primary osteoarthritis of knee: Secondary | ICD-10-CM | POA: Insufficient documentation

## 2023-12-31 DIAGNOSIS — Z79899 Other long term (current) drug therapy: Secondary | ICD-10-CM

## 2023-12-31 DIAGNOSIS — M76891 Other specified enthesopathies of right lower limb, excluding foot: Secondary | ICD-10-CM | POA: Diagnosis not present

## 2023-12-31 DIAGNOSIS — Z789 Other specified health status: Secondary | ICD-10-CM | POA: Diagnosis not present

## 2023-12-31 DIAGNOSIS — M899 Disorder of bone, unspecified: Secondary | ICD-10-CM | POA: Diagnosis not present

## 2023-12-31 DIAGNOSIS — M25519 Pain in unspecified shoulder: Secondary | ICD-10-CM | POA: Insufficient documentation

## 2023-12-31 DIAGNOSIS — M79601 Pain in right arm: Secondary | ICD-10-CM | POA: Diagnosis not present

## 2023-12-31 DIAGNOSIS — M79605 Pain in left leg: Secondary | ICD-10-CM | POA: Diagnosis not present

## 2023-12-31 DIAGNOSIS — M542 Cervicalgia: Secondary | ICD-10-CM | POA: Insufficient documentation

## 2023-12-31 DIAGNOSIS — M1611 Unilateral primary osteoarthritis, right hip: Secondary | ICD-10-CM | POA: Diagnosis not present

## 2024-01-01 LAB — MAGNESIUM: Magnesium: 2.3 mg/dL (ref 1.6–2.3)

## 2024-01-01 LAB — SEDIMENTATION RATE: Sed Rate: 62 mm/h — ABNORMAL HIGH (ref 0–32)

## 2024-01-01 LAB — C-REACTIVE PROTEIN: CRP: 17 mg/L — ABNORMAL HIGH (ref 0–10)

## 2024-01-01 LAB — VITAMIN B12: Vitamin B-12: 577 pg/mL (ref 232–1245)

## 2024-01-03 LAB — COMPLIANCE DRUG ANALYSIS, UR

## 2024-01-05 DIAGNOSIS — R238 Other skin changes: Secondary | ICD-10-CM | POA: Diagnosis not present

## 2024-01-07 ENCOUNTER — Ambulatory Visit
Admission: RE | Admit: 2024-01-07 | Discharge: 2024-01-07 | Disposition: A | Source: Ambulatory Visit | Attending: Pain Medicine | Admitting: Pain Medicine

## 2024-01-07 ENCOUNTER — Ambulatory Visit
Admission: RE | Admit: 2024-01-07 | Discharge: 2024-01-07 | Disposition: A | Attending: Pain Medicine | Admitting: Pain Medicine

## 2024-01-07 ENCOUNTER — Encounter: Payer: Self-pay | Admitting: Family Medicine

## 2024-01-07 DIAGNOSIS — M25519 Pain in unspecified shoulder: Secondary | ICD-10-CM | POA: Diagnosis present

## 2024-01-07 DIAGNOSIS — M542 Cervicalgia: Secondary | ICD-10-CM | POA: Insufficient documentation

## 2024-01-07 DIAGNOSIS — M79601 Pain in right arm: Secondary | ICD-10-CM | POA: Diagnosis present

## 2024-01-07 DIAGNOSIS — G8929 Other chronic pain: Secondary | ICD-10-CM | POA: Diagnosis present

## 2024-01-08 ENCOUNTER — Ambulatory Visit: Payer: Self-pay

## 2024-01-08 NOTE — Telephone Encounter (Signed)
 Patient call note and symptoms reviewed. Agree with scheduled appt.

## 2024-01-08 NOTE — Telephone Encounter (Signed)
 FYI Only or Action Required?: FYI only for provider: appointment scheduled on 01/09/24.  Patient was last seen in primary care on 09/10/2023 by Sharma Coyer, MD.  Called Nurse Triage reporting Wound Infection.  Symptoms began several days ago.  Interventions attempted: Nothing.  Symptoms are: unchanged.  Triage Disposition: No disposition on file.  Patient/caregiver understands and will follow disposition?:    Copied from CRM [id number]. Topic: Clinical - Red Word Triage >> Jan 08, 2024 11:44 AM Tiffini S wrote: [facility] that prompted transfer to Nurse Triage: burn on left hand pinky finger one week ago,   Blistered- asking if a tetanus shot is needed Answer Assessment - Initial Assessment Questions 1. REASON FOR CALL: What is the main reason for your call? or How can I best help you?    Patient reports not infected or draining, but just hurts off and on.  This RN read Dr. Higinio recommendation:  I would leave the skin and keep the area clean and covered as recommended in the chat. You are welcome to schedule a vaccine visit with us  to get an updated tdap.    I know it can be rough waiting for these to heal but you can see us  if it's worsening (increased redness, drainage of yellow fluid/increasing bleeding or increased swelling).    Patient reports no redness, drainage or swelling.  Scheduled appt for Tdap vaccine, no available appts with pcp; 01/09/24.  Advised call back or ED if symptoms worsen. Patient verbalized understanding.  Protocols used: Information Only Call - No Triage-A-[facility]

## 2024-01-09 ENCOUNTER — Ambulatory Visit (INDEPENDENT_AMBULATORY_CARE_PROVIDER_SITE_OTHER): Admitting: Family Medicine

## 2024-01-09 ENCOUNTER — Ambulatory Visit

## 2024-01-09 ENCOUNTER — Encounter: Payer: Self-pay | Admitting: Physical Therapy

## 2024-01-09 ENCOUNTER — Encounter: Payer: Self-pay | Admitting: Family Medicine

## 2024-01-09 VITALS — BP 124/80 | HR 94 | Resp 16 | Ht 63.0 in | Wt 267.7 lb

## 2024-01-09 DIAGNOSIS — M542 Cervicalgia: Secondary | ICD-10-CM | POA: Insufficient documentation

## 2024-01-09 DIAGNOSIS — G43719 Chronic migraine without aura, intractable, without status migrainosus: Secondary | ICD-10-CM | POA: Diagnosis not present

## 2024-01-09 DIAGNOSIS — M25511 Pain in right shoulder: Secondary | ICD-10-CM | POA: Diagnosis present

## 2024-01-09 DIAGNOSIS — G501 Atypical facial pain: Secondary | ICD-10-CM | POA: Insufficient documentation

## 2024-01-09 DIAGNOSIS — T23261A Burn of second degree of back of right hand, initial encounter: Secondary | ICD-10-CM | POA: Diagnosis not present

## 2024-01-09 DIAGNOSIS — T23269A Burn of second degree of back of unspecified hand, initial encounter: Secondary | ICD-10-CM

## 2024-01-09 DIAGNOSIS — G4486 Cervicogenic headache: Secondary | ICD-10-CM | POA: Diagnosis not present

## 2024-01-09 DIAGNOSIS — G8929 Other chronic pain: Secondary | ICD-10-CM | POA: Diagnosis present

## 2024-01-09 DIAGNOSIS — Z23 Encounter for immunization: Secondary | ICD-10-CM | POA: Diagnosis not present

## 2024-01-09 NOTE — Therapy (Unsigned)
 OUTPATIENT PHYSICAL THERAPY CERVICAL EVALUATION   Patient Name: Theresa Phillips MRN: [id number] DOB:15-May-1980, 43 y.o., female Today's Date: 01/10/2024  END OF SESSION:  PT End of Session - 01/09/24 1532     Visit Number 1    Number of Visits 17    Date for Recertification  03/05/24    PT Start Time 1310    PT Stop Time 1347    PT Time Calculation (min) 37 min    Activity Tolerance Patient tolerated treatment well    Behavior During Therapy [facility] for tasks assessed/performed          Past Medical History:  Diagnosis Date   Allergy     See list   Annual physical exam 07/18/2023   Anxiety 05/2014   Chronic pain syndrome    Clotting disorder    Jan/Feb 23   GERD (gastroesophageal reflux disease)    Migraines    Neuromuscular disorder (HCC)    PCOS (polycystic ovarian syndrome)    PTSD (post-traumatic stress disorder)    Seizures (HCC)    Sinusitis    Sleep apnea    7 years ago, CPAP   Vitamin D  deficiency    Past Surgical History:  Procedure Laterality Date   EXPLORATORY LAPAROTOMY     WISDOM TOOTH EXTRACTION     Patient Active Problem List   Diagnosis Date Noted   Neck pain of over 3 months duration 12/31/2023   Neck pain (Right) 12/31/2023   Neck and shoulder pain 12/31/2023   Chronic hip pain (3ry area of Pain) (Right) 12/31/2023   Osteoarthritis of hip (Right) 12/31/2023   Greater trochanteric bursitis (Right) 12/31/2023   Hip abductor tendinitis (Right) 12/31/2023   Osteoarthritis of knees (Bilateral) 12/31/2023   Osteoarthritis involving multiple joints 12/31/2023   Chronic lower extremity pain (5th area of Pain) (Bilateral) 12/31/2023   Migraine without aura and without status migrainosus, not intractable 12/30/2023   Chronic pain syndrome 12/30/2023   Pharmacologic therapy 12/30/2023   Disorder of skeletal system 12/30/2023   Problems influencing health status 12/30/2023   Annual physical exam 07/18/2023   Stress incontinence of urine 03/29/2023    Lumbago with sciatica, right side 03/15/2023   Allergic conjunctivitis and rhinitis, bilateral 03/14/2023   Fibromyalgia 11/09/2022   Seizure (HCC) 09/29/2022   Chronic neck pain (1ry area of Pain) (Right) 04/19/2022   Primary osteoarthritis of right hip, right hip trochanteric bursitis and abductor tendinitis 04/19/2022   Patellofemoral syndrome, bilateral 04/19/2022   Bilateral ankle pain 04/19/2022   Sensory disorder of trigeminal nerve 01/19/2021   Recurrent boils 12/01/2020   Chronic nausea 05/13/2020   Acanthosis nigricans 03/05/2020   Constipation 03/05/2020   Irritable bowel syndrome 03/05/2020   Sciatica 03/05/2020   Facet arthropathy, lumbar 02/17/2020   Chronic migraine without aura, intractable, with status migrainosus 12/10/2019   Chronic daily headache 11/03/2019   Chronic upper extremity pain (2ry area of Pain) (Right) 10/30/2019   Lumbar degenerative disc disease 10/30/2019   Lumbar facet joint pain 10/30/2019   Lumbar radiculopathy 10/30/2019   Carpal tunnel syndrome of left wrist 04/04/2019   Carpal tunnel syndrome of right wrist 04/04/2019   Migraine 03/20/2019   Upper airway cough syndrome/ PNDS ? allergic rhinitis  03/01/2019   OSA on CPAP 03/01/2019   DOE (dyspnea on exertion) 02/28/2019   Tremor 02/26/2019   Bilateral carpal tunnel syndrome 02/13/2019   Radial styloid tenosynovitis of left hand 02/13/2019   Radial styloid tenosynovitis of right hand 02/13/2019   Obesity, Class  III, BMI 40-49.9 (morbid obesity) (HCC) 04/07/2018   Morbid obesity with BMI of 45.0-49.9, adult (HCC) 04/07/2018   Cognitive complaints 05/30/2017   PCOS (polycystic ovarian syndrome) 12/09/2015   Numbness and tingling in left hand 01/12/2015   Bilateral lower extremity pain 08/07/2014   Sleep related rhythmic movement disorder 07/13/2014   Acute pain of right shoulder 06/29/2014   Midline low back pain without sciatica 06/29/2014   Multiple joint pain 06/29/2014   Chronic knee  pain (4th area of Pain) (Bilateral) 06/29/2014   Orthostasis 11/27/2013   Gastritis, chronic 08/12/2013   Allergic rhinitis 01/27/2013   Vitamin D  deficiency 11/27/2012   Chest pain 09/12/2012    PCP: Sharma Coyer  REFERRING PROVIDER: Gust Molly, DO  REFERRING DIAG: [phone] (ICD-10-CM) - Chronic right shoulder pain  THERAPY DIAG:  Cervicalgia  Chronic right shoulder pain  Rationale for Evaluation and Treatment: Rehabilitation  ONSET DATE: Chronic issue, flared up the last 6 months  SUBJECTIVE:                                                                                                                                                                                                         SUBJECTIVE STATEMENT: Pt is a 43 y.o. female referred to OPPT for chronic R shoulder/neck pain.  Hand dominance: Left  PERTINENT HISTORY:  Pt reports recent flare up of neck and RUE pain in the past 6 months. R sided neck pain. Neck pain travels down the outside of the arm mostly to the elbow. Occasionally pain down into the dorsum of hand and also the palmar side. Has tingling into her fingers (pointer, middle, and ring finger) with gripping tasks. Pt reports constant pain, mostly with picking up items. Does have migraines 24/7 and when she has a severe migraine, her RUE hurts but does not have same pain quality as what she was referred here for. Pain described as dull and achey. Denies pain provocation with cervical ranges of motion. Mostly with RUE motion. Pain does radiate down lateral aspect of arm described as jabbing. Pain relieved by Ibuprofen .   PAIN:  Are you having pain? Yes: NPRS scale: 4/10 NPS Pain location: R upper trap and mid humerus Pain description: dull, achey, occasionally jabbing/sharp Aggravating factors: RUE use, picking items up overhead ROM Relieving factors: Ibuprofen , ice, Tramadol   PRECAUTIONS: None  RED FLAGS: None, Bowel or bladder  incontinence: No, and Cauda equina syndrome: No     WEIGHT BEARING RESTRICTIONS: No  FALLS:  Has patient fallen in last 6 months? No   OCCUPATION: unsure  PLOF: Independent  PATIENT GOALS: Less pain   NEXT MD VISIT: N/A  OBJECTIVE:  Note: Objective measures were completed at Evaluation unless otherwise noted.  DIAGNOSTIC FINDINGS:  Radiology Read:   Right Shoulder Xray and Right Humerus Xray on 09/10/2023 FINDINGS: There is no evidence of acute fracture or dislocation. There is no evidence of significant arthropathy. 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 Soft tissues are unremarkable.   IMPRESSION: No acute osseous abnormality.   Cervical Spine Xray on 04/19/2022 FINDINGS: There is no evidence of cervical spine fracture or prevertebral soft tissue swelling. Alignment is normal. No other significant bone abnormalities are identified.   IMPRESSION: Negative cervical spine radiographs.    PATIENT SURVEYS:  NDI:  NECK DISABILITY INDEX  Date: 01/09/24 Score  Pain intensity 2 = The pain is moderate at the moment  2. Personal care (washing, dressing, etc.) 1 =  I can look after myself normally but it causes extra pain  3. Lifting 3 = Pain prevents me from lifting heavy weights but I can manage light to medium   weights if they are conveniently positioned  4. Reading 3 = I can't read as much as I want because of moderate pain in my neck  5. Headaches 5 = I have headaches almost all the time  6. Concentration 1 =  I can concentrate fully when I want to with slight difficulty   7. Work 5 =  I can't do any work at all  8. Driving 2 =  I can drive my car as long as I want with moderate pain in my neck  9. Sleeping 1 = My sleep is slightly disturbed (less than 1 hr sleepless)  10. Recreation 5 = I can't do any recreation activities at all  Total 28/50   Minimum Detectable Change (90% confidence): 5 points or 10% points Quick Dash:  QUICK DASH  Please rate your ability do the  following activities in the last week by selecting the number below the appropriate response.   Activities Rating  Open a tight or new jar.  5 = Unable  Do heavy household chores (e.g., wash walls, floors). 4 = Severe difficulty  Carry a shopping bag or briefcase 4 = Severe difficulty  Wash your back. 3 = Moderate difficulty  Use a knife to cut food. 1 = No difficulty   Recreational activities in which you take some force or impact through your arm, shoulder or hand (e.g., golf, hammering, tennis, etc.). 5 = Unable  During the past week, to what extent has your arm, shoulder or hand problem interfered with your normal social activities with family, friends, neighbors or groups?  1 = Not at all  During the past week, were you limited in your work or other regular daily activities as a result of your arm, shoulder or hand problem? 1 = Not limited at all  Rate the severity of the following symptoms in the last week: Arm, Shoulder, or hand pain. 3 = Moderate  Rate the severity of the following symptoms in the last week: Tingling (pins and needles) in your arm, shoulder or hand. 1 = none  During the past week, how much difficulty have you had sleeping because of the pain in your arm, shoulder or hand?  2 = Mild difficulty   (A QuickDASH score may not be calculated if there is greater than 1 missing item.)  Quick Dash Disability/Symptom Score: 43.2%  Minimally Clinically Important Difference (MCID): 15-20 points  Flavio, F. et  al. (2013). Minimally clinically important difference of the disabilities of the arm, shoulder, and hand outcome measures (DASH) and its shortened version (Quick DASH). Journal of Orthopaedic & Sports Physical Therapy, 44(1), 30-39)   COGNITION: Overall cognitive status: Within functional limits for tasks assessed  SENSATION: WFL  POSTURE: rounded shoulders and forward head  PALPATION: TTP along upper trap on R side. TTP along R sided infraspinatus/teres  minor/major and lats.   CERVICAL ROM:   Active ROM A/PROM (deg) eval  Flexion 40  Extension 55  Right lateral flexion 45  Left lateral flexion 45  Right rotation 55  Left rotation 40   (Blank rows = not tested)  UPPER EXTREMITY ROM:  Active ROM Right eval Left eval  Shoulder flexion 131* 150  Shoulder extension    Shoulder abduction 118* 130  Shoulder adduction    Shoulder extension    Shoulder internal rotation WNL WNL  Shoulder external rotation WNL WNL  Elbow flexion    Elbow extension    Wrist flexion    Wrist extension    Wrist ulnar deviation    Wrist radial deviation    Wrist pronation    Wrist supination     (Blank rows = not tested)  UPPER EXTREMITY MMT:  MMT Right eval Left eval  Shoulder flexion 4* 4  Shoulder extension    Shoulder abduction 4* 4  Shoulder adduction    Shoulder extension    Shoulder internal rotation 5 5  Shoulder external rotation 5 5  Middle trapezius    Lower trapezius    Elbow flexion 5 5  Elbow extension 5 5  Wrist flexion 5 5  Wrist extension 5 5  Wrist ulnar deviation    Wrist radial deviation    Wrist pronation    Wrist supination    Grip strength 5 5   (Blank rows = not tested)  CERVICAL SPECIAL TESTS:  Upper limb tension test (ULTT): Negative and Spurling's test: Negative  Radial, median, and ulnar*  SHOULDER SPECIAL TESTS: Neers: Positive on RUE Hawkins-Kennedy: Positive RUE  FUNCTIONAL TESTS:  Box lift: Deferred to next session  JOINT MOBILITY: C2-C7: Grossly hypomobile. Pt reports tenderness throughout. Does not appear to report concordant pain with CPA's.   TREATMENT DATE: 01/09/24 Eval only                                                                                                                                 PATIENT EDUCATION:  Education details: POC Person educated: Patient Education method: Explanation Education comprehension: verbalized understanding  HOME EXERCISE  PROGRAM: Deferred to next session  ASSESSMENT:  CLINICAL IMPRESSION: Patient is a 43 y.o. female who was seen today for physical therapy evaluation and treatment for Chronic R sided neck and shoulder pain. Pt late to session thus limited in complete evaluation due to time. Pt presents with no signs/symptoms consistent with cervical radiculopathy this date. Pt does report hand weakness  bilaterally and mild changes to sensation but unable to reproduce with examination with C-spine testing. Pt presents with cervical ROM impairments, RUE shoulder ROM impairments and positive RTC testing and weakness consistent with shoulder impingement. Pt with multiple areas of musculoskeletal pain around periscapular region that hurts with most shoulder ROM. Anticipate most of R sided pain is shoulder musculature. Pt does present with many impairments in ADL's as evidenced by Quick DASH and Neck Disability Index greatly limiting patient's ability to complete ADL's without pain. Pt will benefit from skilled PT services to address pain, abnormal posture, muscle weakness, and neck/shoulder ROM deficits to maximize return to PLOF.    OBJECTIVE IMPAIRMENTS: decreased mobility, decreased ROM, decreased strength, hypomobility, impaired UE functional use, postural dysfunction, obesity, and pain.   ACTIVITY LIMITATIONS: carrying, lifting, bathing, dressing, reach over head, and hygiene/grooming  PARTICIPATION LIMITATIONS: cleaning and occupation  PERSONAL FACTORS: Age, Fitness, Past/current experiences, Time since onset of injury/illness/exacerbation, and 3+ comorbidities: Migraines, PTSD, Seizures, chronic pain syndrome, DOE are also affecting patient's functional outcome.   REHAB POTENTIAL: Fair chronicity of symptoms, complex PMH  CLINICAL DECISION MAKING: Evolving/moderate complexity  EVALUATION COMPLEXITY: Moderate   GOALS: Goals reviewed with patient? No  SHORT TERM GOALS: Target date: 02/06/23  Pt will be  independent with HEP to improve R cervical/shoulder ROM and pain for ADL completion. Baseline: 01/09/24: deferred to next session Goal status: INITIAL  LONG TERM GOALS: Target date: 03/06/23  Pt will improve cervical and R shoulder AROM in limited motions by at least 7 degrees to demonstrate clinically significant improvement in mobility for ADL completion.  Baseline: 01/09/24:    CERVICAL ROM:   Active ROM A/PROM (deg) eval  Flexion 40  Extension 55  Right lateral flexion 45  Left lateral flexion 45  Right rotation 55  Left rotation 40   (Blank rows = not tested)  UPPER EXTREMITY ROM:  Active ROM Right eval Left eval  Shoulder flexion 131* 150  Shoulder extension    Shoulder abduction 118* 130    Goal status: INITIAL  2.  Pt will improve Quick DASH by at least 15 points to demonstrate clinically significant improvement in RUE use.  Baseline: 01/09/24: 43.2% Goal status: INITIAL  3.  Pt will improve NDI by at least 5 points to demonstrate clinically significant improvement in disability due to neck pain.  Baseline: 01/09/24: 28/50 Goal status: INITIAL  4.  Pt will improve R shoulder flexion and abduction by at least 7 degrees in each plane to demonstrate clinically significant improvement in RUE use for overhead ADL completion. Baseline: 01/09/24: flexion: 131 degrees, abduction: 118 degrees Goal status: INITIAL  PLAN:  PT FREQUENCY: 1-2x/week  PT DURATION: 8 weeks  PLANNED INTERVENTIONS: [id number]- PT Re-evaluation, 97750- Physical Performance Testing, 97110-Therapeutic exercises, 97530- Therapeutic activity, [id number]- Neuromuscular re-education, 97535- Self Care, [id number]- Manual therapy, [id number]- Aquatic Therapy, [id number]- Electrical stimulation (unattended), [id number]- Electrical stimulation (manual), 20560 (1-2 muscles), 20561 (3+ muscles)- Dry Needling, Patient/Family education, Joint mobilization, Spinal mobilization, Cryotherapy, and Moist heat  PLAN FOR NEXT SESSION:  Establish HEP. Improve cervical and R shoulder mobility. Initiate periscapular strengthening.    Dorina HERO. Fairly IV, PT, DPT Physical Therapist- [place]  [facility] 01/10/2024, 8:00 AM

## 2024-01-09 NOTE — Progress Notes (Signed)
 Name: Theresa Phillips   MRN: [id number]    DOB: 24-Jan-1980   Date:01/09/2024       Progress Note  Subjective  Chief Complaint  Chief Complaint  Patient presents with   Hand Pain    L hand burned it with light bulb    Discussed the use of AI scribe software for clinical note transcription with the patient, who gave verbal consent to proceed.  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  She sustained a burn on the dorsal aspect of her hand after accidentally touching a hot light bulb while half asleep. The incident occurred after the lamp had been on for several hours, and the lamp cover was removed to increase light. She immediately washed her hand after the burn.  Initially, the burn formed a blister, which swelled and accidentally burst against her bed cover the next day. She has been applying Neosporin daily to the affected area. The burn is tender and sometimes stings, especially when showering. She has been using a Q-tip to apply Vaseline to the burn to prevent it from sticking to the Band-Aid. She also mentioned using a prescription ointment, but the name was unclear.    Patient Active Problem List   Diagnosis Date Noted   Neck pain of over 3 months duration 12/31/2023   Neck pain (Right) 12/31/2023   Neck and shoulder pain 12/31/2023   Chronic hip pain (3ry area of Pain) (Right) 12/31/2023   Osteoarthritis of hip (Right) 12/31/2023   Greater trochanteric bursitis (Right) 12/31/2023   Hip abductor tendinitis (Right) 12/31/2023   Osteoarthritis of knees (Bilateral) 12/31/2023   Osteoarthritis involving multiple joints 12/31/2023   Chronic lower extremity pain (5th area of Pain) (Bilateral) 12/31/2023   Migraine without aura and without status migrainosus, not intractable 12/30/2023   Chronic pain syndrome 12/30/2023   Pharmacologic therapy 12/30/2023   Disorder of skeletal system 12/30/2023   Problems influencing  health status 12/30/2023   Annual physical exam 07/18/2023   Stress incontinence of urine 03/29/2023   Lumbago with sciatica, right side 03/15/2023   Allergic conjunctivitis and rhinitis, bilateral 03/14/2023   Fibromyalgia 11/09/2022   Seizure (HCC) 09/29/2022   Chronic neck pain (1ry area of Pain) (Right) 04/19/2022   Primary osteoarthritis of right hip, right hip trochanteric bursitis and abductor tendinitis 04/19/2022   Patellofemoral syndrome, bilateral 04/19/2022   Bilateral ankle pain 04/19/2022   Sensory disorder of trigeminal nerve 01/19/2021   Recurrent boils 12/01/2020   Chronic nausea 05/13/2020   Acanthosis nigricans 03/05/2020   Constipation 03/05/2020   Irritable bowel syndrome 03/05/2020   Sciatica 03/05/2020   Facet arthropathy, lumbar 02/17/2020   Chronic migraine without aura, intractable, with status migrainosus 12/10/2019   Chronic daily headache 11/03/2019   Chronic upper extremity pain (2ry area of Pain) (Right) 10/30/2019   Lumbar degenerative disc disease 10/30/2019   Lumbar facet joint pain 10/30/2019   Lumbar radiculopathy 10/30/2019   Carpal tunnel syndrome of left wrist 04/04/2019   Carpal tunnel syndrome of right wrist 04/04/2019   Migraine 03/20/2019   Upper airway cough syndrome/ PNDS ? 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    Current Medications[1]  Allergies[2]  ROS  Ten systems reviewed and is negative except as mentioned in HPI    Objective  Vitals:   01/09/24 1501  BP: 124/80  Pulse: 94  Resp: 16  SpO2: 97%  Weight: 267 lb 11.2 oz (121.4 kg)  Height: 5' 3 (1.6 m)    Body mass index is 47.42 kg/m.   Physical Exam CONSTITUTIONAL: Patient appears well-developed and well-nourished. No distress. HEENT: Head atraumatic, normocephalic, neck supple. CARDIOVASCULAR: Normal rate, regular rhythm and normal heart sounds. No murmur heard. No BLE edema. PULMONARY: Effort normal and breath sounds normal. No respiratory distress. ABDOMINAL: There is no tenderness or distention. MUSCULOSKELETAL: Normal gait. Without gross motor or sensory deficit. PSYCHIATRIC: Patient has a normal mood and affect. Behavior is normal. Judgment and thought content normal. SKIN: No signs of infection on the dorsal aspect of the hand.    Recent Results (from the past 2160 hours)  Magnesium     Status: None   Collection Time: 12/31/23  4:02 PM  Result Value Ref Range   Magnesium 2.3 1.6 - 2.3 mg/dL  Vitamin B12     Status: None   Collection Time: 12/31/23  4:02 PM  Result Value Ref Range   Vitamin B-12 577 232 - 1,245 pg/mL  Sedimentation rate     Status: Abnormal   Collection Time: 12/31/23  4:02 PM  Result Value Ref Range   Sed Rate 62 (H) 0 - 32 mm/hr  C-reactive protein     Status: Abnormal    Collection Time: 12/31/23  4:02 PM  Result Value Ref Range   CRP 17 (H) 0 - 10 mg/L  Compliance Drug Analysis, Ur     Status: None   Collection Time: 12/31/23  4:04 PM  Result Value Ref Range   Summary FINAL     Comment: ==================================================================== Compliance Drug Analysis, Ur ==================================================================== Test                             Result       Flag       Units  Drug Present and Declared for Prescription Verification   Gabapentin                      PRESENT      EXPECTED   Amitriptyline                   PRESENT      EXPECTED   Nortriptyline  PRESENT      EXPECTED    Nortriptyline is an expected metabolite of amitriptyline .  Drug Present not Declared for Prescription Verification   Carbamazepine                  PRESENT      UNEXPECTED   Dextrorphan/Levorphanol        PRESENT      UNEXPECTED    Dextrorphan is an expected metabolite of dextromethorphan, an over-    the-counter or prescription cough suppressant. Levorphanol is a    scheduled prescription medication. Dextrorphan cannot be    distinguished from levorphanol by the method used for analysis.    Guaifenesin                     PRESENT      UNEXPECTED    Guaifenesin may be administered as an over-the-counter or    prescription drug; it may also be present as a breakdown product of    methocarbamol.  Drug Absent but Declared for Prescription Verification   Tramadol                        Not Detected UNEXPECTED ng/mg creat   Ibuprofen                       Not Detected UNEXPECTED    Ibuprofen , as indicated in the declared medication list, is not    always detected even when used as directed.    Promethazine                    Not Detected UNEXPECTED ==================================================================== Test                      Result    Flag   Units      Ref Range   Creatinine              276               mg/dL      >=79 ==================================================================== Declared Medications:  The flagging and interpretation on this report are based on the  following declared medications.  Unexpected results may arise from  inaccuracies in the declared medic ations.   **Note: The testing scope of this panel includes these medications:   Amitriptyline  (Elavil )  Gabapentin   Promethazine   Tramadol    **Note: The testing scope of this panel does not include small to  moderate amounts of these reported medications:   Ibuprofen  (Advil )   **Note: The testing scope of this panel does not include the  following reported medications:   Azelastine  (Astelin )  Benzonatate  (Tessalon )  Betamethasone  (Lotrisone )  Botulinum (Botox )  Clindamycin   Clobetasol  (Temovate )  Clotrimazole  (Lotrisone )  Divaleproex (Depakote)  Fluticasone  (Flonase )  Ipratropium (Atrovent )  Iron   Levocetirizine (Xyzal )  Mupirocin  (Bactroban )  Norethindrone   Nystatin  (Mycostatin )  Ondansetron  (Zofran )  Pantoprazole (Protonix)  Plecanatide  Prednisone  (Deltasone )  Rizatriptan  (Maxalt )  Triamcinolone   Vitamin D2 (Drisdol ) ==================================================================== For clinical consultation, please call [phone]. === =================================================================       Assessment & Plan Burn of right hand Burn on dorsal right hand from hot light bulb contact. Initial blistering, no sings of infection, affecting superficial dermis  . Healing with granulation tissue. Potential scarring anticipated. Patient gave verbal consent  - Debrided dead skin. - Clean wound with soap and water daily. - Apply Vaseline or similar ointment to prevent sticking to  Band-Aid. - Keep wound covered to reduce pain. - Administered tetanus shot.  General Health Maintenance Tetanus vaccination was due. - Administered tetanus shot.            [1]   Current Outpatient Medications:    amitriptyline  (ELAVIL ) 50 MG tablet, Take 1 tablet (50 mg total) by mouth at bedtime., Disp: , Rfl:    azelastine  (ASTELIN ) 0.1 % nasal spray, Place 2 sprays into both nostrils 2 (two) times daily as needed for allergies. Use in each nostril as directed, Disp: 30 mL, Rfl: 5   azelastine  (OPTIVAR ) 0.05 % ophthalmic solution, 1 drop each eye 1-2 times a day as needed for red or itchy eyes., Disp: 60 mL, Rfl: 5   benzonatate  (TESSALON ) 200 MG capsule, Take 1 capsule (200 mg total) by mouth 2 (two) times daily as needed for cough., Disp: 20 capsule, Rfl: 2   botulinum toxin Type A  (BOTOX ) 200 units injection, Inject 155 units into the head and neck muscles every 90 days., Disp: 1 each, Rfl: 1   clindamycin  (CLINDAGEL) 1 % gel, Apply topically 2 (two) times daily as needed., Disp: 30 g, Rfl: 3   clobetasol  ointment (TEMOVATE ) 0.05 %, Apply to affected area every night for 4 weeks, then every other day for 4 weeks and then twice a week for 4 weeks or until resolution., Disp: 30 g, Rfl: 5   clotrimazole -betamethasone  (LOTRISONE ) cream, Apply topically 2 (two) times daily., Disp: 30 g, Rfl: 3   divalproex (DEPAKOTE) 250 MG DR tablet, Take 250 mg by mouth 2 (two) times daily as needed., Disp: , Rfl:    fluticasone  (FLONASE ) 50 MCG/ACT nasal spray, Place 2 sprays into both nostrils daily., Disp: 16 g, Rfl: 5   gabapentin  (NEURONTIN ) 800 MG tablet, Take 1 tablet (800 mg total) by mouth 3 (three) times daily., Disp: 270 tablet, Rfl: 3   ibuprofen  (ADVIL ) 800 MG tablet, Take 1 tablet (800 mg total) by mouth every 8 (eight) hours as needed., Disp: 90 tablet, Rfl: 2   ipratropium (ATROVENT ) 0.03 % nasal spray, Place 2 sprays into both nostrils every 12 (twelve) hours., Disp: 30 mL, Rfl: 0   Iron , Ferrous Sulfate , 325 (65 Fe) MG TABS, Take 325 mg by mouth daily., Disp: 90 tablet, Rfl: 1   levocetirizine (XYZAL ) 5 MG tablet, Take 1 tablet (5 mg total) by mouth daily. Can take an  additional tablet if needed., Disp: 60 tablet, Rfl: 5   mupirocin  ointment (BACTROBAN ) 2 %, Apply 1 Application topically 3 (three) times daily., Disp: 30 g, Rfl: 3   norethindrone  (MICRONOR ) 0.35 MG tablet, Take 1 tablet (0.35 mg total) by mouth daily., Disp: 28 tablet, Rfl: 11   nystatin  ointment (MYCOSTATIN ), Apply 1 Application topically 2 (two) times daily., Disp: 30 g, Rfl: 3   nystatin -triamcinolone  ointment (MYCOLOG), Apply 1 Application topically 2 (two) times daily., Disp: 30 g, Rfl: 3   ondansetron  (ZOFRAN ) 8 MG tablet, Take by mouth., Disp: , Rfl:    pantoprazole (PROTONIX) 40 MG tablet, Take by mouth., Disp: , Rfl:    predniSONE  (DELTASONE ) 10 MG tablet, Take one tablet (10mg ) twice daily for six days days, then one tablet (10mg ) once daily for six days, then STOP., Disp: 18 tablet, Rfl: 0   promethazine  (PHENERGAN ) 25 MG tablet, Take 1 tablet (25 mg total) by mouth every 6 (six) hours as needed for nausea or vomiting., Disp: 30 tablet, Rfl: 0   promethazine  (PHENERGAN ) 25 MG tablet, Take 1 tablet (25 mg  total) by mouth every 8 (eight) hours as needed for nausea or vomiting., Disp: 90 tablet, Rfl: 1   promethazine -dextromethorphan (PROMETHAZINE -DM) 6.25-15 MG/5ML syrup, Take 5 mLs by mouth 4 (four) times daily as needed., Disp: 118 mL, Rfl: 0   rizatriptan  (MAXALT -MLT) 10 MG disintegrating tablet, Take 1 tablet (10 mg total) by mouth as needed for migraine. May repeat in 2 hours if needed. Max dose 2 pills in 24 hours, Disp: 9 tablet, Rfl: 11   traMADol  (ULTRAM ) 50 MG tablet, Take 1 tablet (50 mg total) by mouth every 8 (eight) hours as needed., Disp: 30 tablet, Rfl: 1   TRULANCE 3 MG TABS, Take by mouth., Disp: , Rfl:    Vitamin D , Ergocalciferol , (DRISDOL ) 1.25 MG (50000 UNIT) CAPS capsule, TAKE 1 CAPSULE (50,000 UNITS TOTAL) BY MOUTH EVERY 7 (SEVEN) DAYS, Disp: 12 capsule, Rfl: 1 [2]  Allergies Allergen Reactions   Lanolin Itching and Other (See Comments)   Penicillins Other (See  Comments) and Rash   Almond Oil Other (See Comments)   Ca Phosphate-Cholecalciferol Other (See Comments)   Cheese Other (See Comments)   Guaifenesin Other (See Comments)    She is allergic to the capsule not the medication     Methocarbamol Other (See Comments)   Naproxen Other (See Comments)    Precipitates migraines but other NSAIDS ok   Omeprazole Other (See Comments)    headache   Other Other (See Comments)   Peanut (Diagnostic) Other (See Comments)   Peanut Allergen Powder-Dnfp Other (See Comments)   Peanut-Containing Drug Products Other (See Comments)   Porcine (Pork) Protein-Containing Drug Products Other (See Comments)    She is allergic to all capsule    Pork Allergy  Other (See Comments)   Topiramate Other (See Comments)    Tingling in hands and feet   Carbamazepine Itching    Just the chewable tablets   Clindamycin /Lincomycin Itching   Oxycodone Itching   Tylenol [Acetaminophen] Other (See Comments)    Gives me migraines.  States she can take it mixed with hydrocodone .

## 2024-01-14 ENCOUNTER — Encounter: Payer: Self-pay | Admitting: Family Medicine

## 2024-01-21 ENCOUNTER — Ambulatory Visit

## 2024-01-21 DIAGNOSIS — G8929 Other chronic pain: Secondary | ICD-10-CM

## 2024-01-21 DIAGNOSIS — M542 Cervicalgia: Secondary | ICD-10-CM | POA: Diagnosis not present

## 2024-01-21 NOTE — Therapy (Signed)
 " OUTPATIENT PHYSICAL THERAPY CERVICAL TREATMENT   Patient Name: Theresa Phillips MRN: [id number] DOB:November 12, 1980, 43 y.o., female Today's Date: 01/21/2024  END OF SESSION:  PT End of Session - 01/21/24 1356     Visit Number 2    Number of Visits 17    Date for Recertification  03/05/24    PT Start Time 1356    PT Stop Time 1430    PT Time Calculation (min) 34 min    Activity Tolerance Patient tolerated treatment well    Behavior During Therapy [facility] for tasks assessed/performed           Past Medical History:  Diagnosis Date   Allergy     See list   Annual physical exam 07/18/2023   Anxiety 05/2014   Chronic pain syndrome    Clotting disorder    Jan/Feb 23   GERD (gastroesophageal reflux disease)    Migraines    Neuromuscular disorder (HCC)    PCOS (polycystic ovarian syndrome)    PTSD (post-traumatic stress disorder)    Seizures (HCC)    Sinusitis    Sleep apnea    7 years ago, CPAP   Vitamin D  deficiency    Past Surgical History:  Procedure Laterality Date   EXPLORATORY LAPAROTOMY     WISDOM TOOTH EXTRACTION     Patient Active Problem List   Diagnosis Date Noted   Neck pain of over 3 months duration 12/31/2023   Neck pain (Right) 12/31/2023   Neck and shoulder pain 12/31/2023   Chronic hip pain (3ry area of Pain) (Right) 12/31/2023   Osteoarthritis of hip (Right) 12/31/2023   Greater trochanteric bursitis (Right) 12/31/2023   Hip abductor tendinitis (Right) 12/31/2023   Osteoarthritis of knees (Bilateral) 12/31/2023   Osteoarthritis involving multiple joints 12/31/2023   Chronic lower extremity pain (5th area of Pain) (Bilateral) 12/31/2023   Migraine without aura and without status migrainosus, not intractable 12/30/2023   Chronic pain syndrome 12/30/2023   Pharmacologic therapy 12/30/2023   Disorder of skeletal system 12/30/2023   Problems influencing health status 12/30/2023   Annual physical exam 07/18/2023   Stress incontinence of urine 03/29/2023    Lumbago with sciatica, right side 03/15/2023   Allergic conjunctivitis and rhinitis, bilateral 03/14/2023   Fibromyalgia 11/09/2022   Seizure (HCC) 09/29/2022   Chronic neck pain (1ry area of Pain) (Right) 04/19/2022   Primary osteoarthritis of right hip, right hip trochanteric bursitis and abductor tendinitis 04/19/2022   Patellofemoral syndrome, bilateral 04/19/2022   Bilateral ankle pain 04/19/2022   Sensory disorder of trigeminal nerve 01/19/2021   Recurrent boils 12/01/2020   Chronic nausea 05/13/2020   Acanthosis nigricans 03/05/2020   Constipation 03/05/2020   Irritable bowel syndrome 03/05/2020   Sciatica 03/05/2020   Facet arthropathy, lumbar 02/17/2020   Chronic migraine without aura, intractable, with status migrainosus 12/10/2019   Chronic daily headache 11/03/2019   Chronic upper extremity pain (2ry area of Pain) (Right) 10/30/2019   Lumbar degenerative disc disease 10/30/2019   Lumbar facet joint pain 10/30/2019   Lumbar radiculopathy 10/30/2019   Carpal tunnel syndrome of left wrist 04/04/2019   Carpal tunnel syndrome of right wrist 04/04/2019   Migraine 03/20/2019   Upper airway cough syndrome/ PNDS ? allergic rhinitis  03/01/2019   OSA on CPAP 03/01/2019   DOE (dyspnea on exertion) 02/28/2019   Tremor 02/26/2019   Bilateral carpal tunnel syndrome 02/13/2019   Radial styloid tenosynovitis of left hand 02/13/2019   Radial styloid tenosynovitis of right hand 02/13/2019  Obesity, Class III, BMI 40-49.9 (morbid obesity) (HCC) 04/07/2018   Morbid obesity with BMI of 45.0-49.9, adult (HCC) 04/07/2018   Cognitive complaints 05/30/2017   PCOS (polycystic ovarian syndrome) 12/09/2015   Numbness and tingling in left hand 01/12/2015   Bilateral lower extremity pain 08/07/2014   Sleep related rhythmic movement disorder 07/13/2014   Acute pain of right shoulder 06/29/2014   Midline low back pain without sciatica 06/29/2014   Multiple joint pain 06/29/2014   Chronic knee  pain (4th area of Pain) (Bilateral) 06/29/2014   Orthostasis 11/27/2013   Gastritis, chronic 08/12/2013   Allergic rhinitis 01/27/2013   Vitamin D  deficiency 11/27/2012   Chest pain 09/12/2012    PCP: Sharma Coyer  REFERRING PROVIDER: Gust Molly, DO  REFERRING DIAG: [phone] (ICD-10-CM) - Chronic right shoulder pain  THERAPY DIAG:  No diagnosis found.  Rationale for Evaluation and Treatment: Rehabilitation  ONSET DATE: Chronic issue, flared up the last 6 months  SUBJECTIVE:                                                                                                                                                                                                         SUBJECTIVE STATEMENT: 01/21/2024: Patient reports 6/10 in the upper neck extending to the R elbow prior to the session.   Hand dominance: Left  PERTINENT HISTORY:  Pt reports recent flare up of neck and RUE pain in the past 6 months. R sided neck pain. Neck pain travels down the outside of the arm mostly to the elbow. Occasionally pain down into the dorsum of hand and also the palmar side. Has tingling into her fingers (pointer, middle, and ring finger) with gripping tasks. Pt reports constant pain, mostly with picking up items. Does have migraines 24/7 and when she has a severe migraine, her RUE hurts but does not have same pain quality as what she was referred here for. Pain described as dull and achey. Denies pain provocation with cervical ranges of motion. Mostly with RUE motion. Pain does radiate down lateral aspect of arm described as jabbing. Pain relieved by Ibuprofen .   PAIN:  Are you having pain? Yes: NPRS scale: 4/10 NPS Pain location: R upper trap and mid humerus Pain description: dull, achey, occasionally jabbing/sharp Aggravating factors: RUE use, picking items up overhead ROM Relieving factors: Ibuprofen , ice, Tramadol   PRECAUTIONS: None  RED FLAGS: None, Bowel or bladder  incontinence: No, and Cauda equina syndrome: No     WEIGHT BEARING RESTRICTIONS: No  FALLS:  Has patient fallen in last 6 months? No  OCCUPATION: unsure  PLOF: Independent  PATIENT GOALS: Less pain   NEXT MD VISIT: N/A  OBJECTIVE:  Note: Objective measures were completed at Evaluation unless otherwise noted.  DIAGNOSTIC FINDINGS:  Radiology Read:   Right Shoulder Xray and Right Humerus Xray on 09/10/2023 FINDINGS: There is no evidence of acute fracture or dislocation. There is no evidence of significant arthropathy. 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 Soft tissues are unremarkable.   IMPRESSION: No acute osseous abnormality.   Cervical Spine Xray on 04/19/2022 FINDINGS: There is no evidence of cervical spine fracture or prevertebral soft tissue swelling. Alignment is normal. No other significant bone abnormalities are identified.   IMPRESSION: Negative cervical spine radiographs.    PATIENT SURVEYS:  NDI:  NECK DISABILITY INDEX  Date: 01/09/24 Score  Pain intensity 2 = The pain is moderate at the moment  2. Personal care (washing, dressing, etc.) 1 =  I can look after myself normally but it causes extra pain  3. Lifting 3 = Pain prevents me from lifting heavy weights but I can manage light to medium   weights if they are conveniently positioned  4. Reading 3 = I can't read as much as I want because of moderate pain in my neck  5. Headaches 5 = I have headaches almost all the time  6. Concentration 1 =  I can concentrate fully when I want to with slight difficulty   7. Work 5 =  I can't do any work at all  8. Driving 2 =  I can drive my car as long as I want with moderate pain in my neck  9. Sleeping 1 = My sleep is slightly disturbed (less than 1 hr sleepless)  10. Recreation 5 = I can't do any recreation activities at all  Total 28/50   Minimum Detectable Change (90% confidence): 5 points or 10% points Quick Dash:  QUICK DASH  Please rate your ability do the  following activities in the last week by selecting the number below the appropriate response.   Activities Rating  Open a tight or new jar.  5 = Unable  Do heavy household chores (e.g., wash walls, floors). 4 = Severe difficulty  Carry a shopping bag or briefcase 4 = Severe difficulty  Wash your back. 3 = Moderate difficulty  Use a knife to cut food. 1 = No difficulty   Recreational activities in which you take some force or impact through your arm, shoulder or hand (e.g., golf, hammering, tennis, etc.). 5 = Unable  During the past week, to what extent has your arm, shoulder or hand problem interfered with your normal social activities with family, friends, neighbors or groups?  1 = Not at all  During the past week, were you limited in your work or other regular daily activities as a result of your arm, shoulder or hand problem? 1 = Not limited at all  Rate the severity of the following symptoms in the last week: Arm, Shoulder, or hand pain. 3 = Moderate  Rate the severity of the following symptoms in the last week: Tingling (pins and needles) in your arm, shoulder or hand. 1 = none  During the past week, how much difficulty have you had sleeping because of the pain in your arm, shoulder or hand?  2 = Mild difficulty   (A QuickDASH score may not be calculated if there is greater than 1 missing item.)  Quick Dash Disability/Symptom Score: 43.2%  Minimally Clinically Important Difference (MCID): 15-20 points  (  Franchignoni, F. et al. (2013). Minimally clinically important difference of the disabilities of the arm, shoulder, and hand outcome measures (DASH) and its shortened version (Quick DASH). Journal of Orthopaedic & Sports Physical Therapy, 44(1), 30-39)   COGNITION: Overall cognitive status: Within functional limits for tasks assessed  SENSATION: WFL  POSTURE: rounded shoulders and forward head  PALPATION: TTP along upper trap on R side. TTP along R sided infraspinatus/teres  minor/major and lats.   CERVICAL ROM:   Active ROM A/PROM (deg) eval  Flexion 40  Extension 55  Right lateral flexion 45  Left lateral flexion 45  Right rotation 55  Left rotation 40   (Blank rows = not tested)  UPPER EXTREMITY ROM:  Active ROM Right eval Left eval  Shoulder flexion 131* 150  Shoulder extension    Shoulder abduction 118* 130  Shoulder adduction    Shoulder extension    Shoulder internal rotation WNL WNL  Shoulder external rotation WNL WNL  Elbow flexion    Elbow extension    Wrist flexion    Wrist extension    Wrist ulnar deviation    Wrist radial deviation    Wrist pronation    Wrist supination     (Blank rows = not tested)  UPPER EXTREMITY MMT:  MMT Right eval Left eval  Shoulder flexion 4* 4  Shoulder extension    Shoulder abduction 4* 4  Shoulder adduction    Shoulder extension    Shoulder internal rotation 5 5  Shoulder external rotation 5 5  Middle trapezius    Lower trapezius    Elbow flexion 5 5  Elbow extension 5 5  Wrist flexion 5 5  Wrist extension 5 5  Wrist ulnar deviation    Wrist radial deviation    Wrist pronation    Wrist supination    Grip strength 5 5   (Blank rows = not tested)  CERVICAL SPECIAL TESTS:  Upper limb tension test (ULTT): Negative and Spurling's test: Negative  Radial, median, and ulnar*  SHOULDER SPECIAL TESTS: Neers: Positive on RUE Hawkins-Kennedy: Positive RUE  FUNCTIONAL TESTS:  Box lift: Deferred to next session  JOINT MOBILITY: C2-C7: Grossly hypomobile. Pt reports tenderness throughout. Does not appear to report concordant pain with CPA's.   TREATMENT DATE: 01/21/2024  Neuromuscular Re-education:   Seated Scapular Retraction    2 x 15 x 2s hold - Tactile cue for proper scapular retraction    Seated External Rotation with Scapular Retraction   2 x 10 - Red TB    1 x 10 - Green TB    Standing Shoulder Extension (BUE)    3 x 10 - Green TB    Shoulder Row    3 x 10 - Blue  TB  Therapeutic Exercise:   AAROM    Shoulder Flexion with Dowel: 2 x 10     - able to achieve 140 deg with     Shoulder Abduction with Dowel: 2 x 10    Wall Angels   2 x 10    Reviewed HEP packet and addressed questions/concerns.       PATIENT EDUCATION:  Education details: HEP, Exercise Technique Person educated: Patient Education method: Explanation Education comprehension: verbalized understanding  HOME EXERCISE PROGRAM: Access Code: [email or web address] URL: [email or web address] Date: 01/21/2024 Prepared by: Lonni Pall  Exercises - Seated Scapular Retraction  - 1 x daily - 7 x weekly - 3 sets - 15 reps - Standing Shoulder Row with Anchored Resistance  - 1 x  daily - 3-4 x weekly - 2-3 sets - 10 reps - Shoulder extension with resistance - Neutral  - 1 x daily - 3-4 x weekly - 2-3 sets - 10-12 reps - Wall Angels  - 1 x daily - 3-4 x weekly - 2-3 sets - 10-12 reps - Seated Upper Trapezius Stretch  - 1 x daily - 7 x weekly - 3 sets - 30s hold  ASSESSMENT:  CLINICAL IMPRESSION:  Patient returning to OPPT for management of R shoulder weakness and cervical pain. Initiated HEP for shoulder AAROM with good return demonstration. Patient tolerated initial bout of parascapular strengthening, provided TB for light resistance at home. Mod VC for proper technique and positioning throughout session. Educated patient on home set up with TB anchor. She is still has deficits in cervical and shoulder ROM limiting her full participation with ADLs at home. Pt will benefit from skilled PT services to address pain, abnormal posture, muscle weakness, and neck/shoulder ROM deficits to maximize return to PLOF.    OBJECTIVE IMPAIRMENTS: decreased mobility, decreased ROM, decreased strength, hypomobility, impaired UE functional use, postural dysfunction, obesity, and pain.   ACTIVITY LIMITATIONS: carrying, lifting, bathing, dressing, reach over head, and hygiene/grooming  PARTICIPATION  LIMITATIONS: cleaning and occupation  PERSONAL FACTORS: Age, Fitness, Past/current experiences, Time since onset of injury/illness/exacerbation, and 3+ comorbidities: Migraines, PTSD, Seizures, chronic pain syndrome, DOE are also affecting patient's functional outcome.   REHAB POTENTIAL: Fair chronicity of symptoms, complex PMH  CLINICAL DECISION MAKING: Evolving/moderate complexity  EVALUATION COMPLEXITY: Moderate   GOALS: Goals reviewed with patient? No  SHORT TERM GOALS: Target date: 02/06/23  Pt will be independent with HEP to improve R cervical/shoulder ROM and pain for ADL completion. Baseline: 01/09/24: deferred to next session Goal status: INITIAL  LONG TERM GOALS: Target date: 03/06/23  Pt will improve cervical and R shoulder AROM in limited motions by at least 7 degrees to demonstrate clinically significant improvement in mobility for ADL completion.  Baseline: 01/09/24:    CERVICAL ROM:   Active ROM A/PROM (deg) eval  Flexion 40  Extension 55  Right lateral flexion 45  Left lateral flexion 45  Right rotation 55  Left rotation 40   (Blank rows = not tested)  UPPER EXTREMITY ROM:  Active ROM Right eval Left eval  Shoulder flexion 131* 150  Shoulder extension    Shoulder abduction 118* 130    Goal status: INITIAL  2.  Pt will improve Quick DASH by at least 15 points to demonstrate clinically significant improvement in RUE use.  Baseline: 01/09/24: 43.2% Goal status: INITIAL  3.  Pt will improve NDI by at least 5 points to demonstrate clinically significant improvement in disability due to neck pain.  Baseline: 01/09/24: 28/50 Goal status: INITIAL  4.  Pt will improve R shoulder flexion and abduction by at least 7 degrees in each plane to demonstrate clinically significant improvement in RUE use for overhead ADL completion. Baseline: 01/09/24: flexion: 131 degrees, abduction: 118 degrees Goal status: INITIAL  PLAN:  PT FREQUENCY: 1-2x/week  PT  DURATION: 8 weeks  PLANNED INTERVENTIONS: [id number]- PT Re-evaluation, 97750- Physical Performance Testing, 97110-Therapeutic exercises, 97530- Therapeutic activity, [id number]- Neuromuscular re-education, 97535- Self Care, [id number]- Manual therapy, [id number]- Aquatic Therapy, [id number]- Electrical stimulation (unattended), [id number]- Electrical stimulation (manual), 20560 (1-2 muscles), [id number] (3+ muscles)- Dry Needling, Patient/Family education, Joint mobilization, Spinal mobilization, Cryotherapy, and Moist heat  PLAN FOR NEXT SESSION: Review HEP. Progress Cervical and R Shoulder mobility (AAROM). Improve cervical and R shoulder mobility. Progress  periscapular strengthening (postural muscles)  Lonni Pall PT, DPT Physical Therapist- [facility] 01/21/2024, 1:57 PM "

## 2024-01-28 ENCOUNTER — Ambulatory Visit

## 2024-01-28 ENCOUNTER — Ambulatory Visit: Admitting: Pain Medicine

## 2024-01-30 ENCOUNTER — Ambulatory Visit

## 2024-01-30 DIAGNOSIS — M25511 Pain in right shoulder: Secondary | ICD-10-CM | POA: Insufficient documentation

## 2024-01-30 DIAGNOSIS — M542 Cervicalgia: Secondary | ICD-10-CM | POA: Insufficient documentation

## 2024-01-30 DIAGNOSIS — R262 Difficulty in walking, not elsewhere classified: Secondary | ICD-10-CM | POA: Diagnosis present

## 2024-01-30 DIAGNOSIS — M25551 Pain in right hip: Secondary | ICD-10-CM | POA: Diagnosis present

## 2024-01-30 DIAGNOSIS — G8929 Other chronic pain: Secondary | ICD-10-CM | POA: Insufficient documentation

## 2024-01-30 NOTE — Therapy (Signed)
 " OUTPATIENT PHYSICAL THERAPY CERVICAL TREATMENT   Patient Name: Theresa Phillips MRN: [id number] DOB:11-Dec-1980, 44 y.o., female Today's Date: 01/30/2024  END OF SESSION:  PT End of Session - 01/30/24 1125     Visit Number 3    Number of Visits 17    Date for Recertification  03/05/24    PT Start Time 1125    PT Stop Time 1205    PT Time Calculation (min) 40 min    Activity Tolerance Patient tolerated treatment well    Behavior During Therapy [facility] for tasks assessed/performed         Past Medical History:  Diagnosis Date   Allergy     See list   Annual physical exam 07/18/2023   Anxiety 05/2014   Chronic pain syndrome    Clotting disorder    Jan/Feb 23   GERD (gastroesophageal reflux disease)    Migraines    Neuromuscular disorder (HCC)    PCOS (polycystic ovarian syndrome)    PTSD (post-traumatic stress disorder)    Seizures (HCC)    Sinusitis    Sleep apnea    7 years ago, CPAP   Vitamin D  deficiency    Past Surgical History:  Procedure Laterality Date   EXPLORATORY LAPAROTOMY     WISDOM TOOTH EXTRACTION     Patient Active Problem List   Diagnosis Date Noted   Neck pain of over 3 months duration 12/31/2023   Neck pain (Right) 12/31/2023   Neck and shoulder pain 12/31/2023   Chronic hip pain (3ry area of Pain) (Right) 12/31/2023   Osteoarthritis of hip (Right) 12/31/2023   Greater trochanteric bursitis (Right) 12/31/2023   Hip abductor tendinitis (Right) 12/31/2023   Osteoarthritis of knees (Bilateral) 12/31/2023   Osteoarthritis involving multiple joints 12/31/2023   Chronic lower extremity pain (5th area of Pain) (Bilateral) 12/31/2023   Migraine without aura and without status migrainosus, not intractable 12/30/2023   Chronic pain syndrome 12/30/2023   Pharmacologic therapy 12/30/2023   Disorder of skeletal system 12/30/2023   Problems influencing health status 12/30/2023   Annual physical exam 07/18/2023   Stress incontinence of urine 03/29/2023   Lumbago  with sciatica, right side 03/15/2023   Allergic conjunctivitis and rhinitis, bilateral 03/14/2023   Fibromyalgia 11/09/2022   Seizure (HCC) 09/29/2022   Chronic neck pain (1ry area of Pain) (Right) 04/19/2022   Primary osteoarthritis of right hip, right hip trochanteric bursitis and abductor tendinitis 04/19/2022   Patellofemoral syndrome, bilateral 04/19/2022   Bilateral ankle pain 04/19/2022   Sensory disorder of trigeminal nerve 01/19/2021   Recurrent boils 12/01/2020   Chronic nausea 05/13/2020   Acanthosis nigricans 03/05/2020   Constipation 03/05/2020   Irritable bowel syndrome 03/05/2020   Sciatica 03/05/2020   Facet arthropathy, lumbar 02/17/2020   Chronic migraine without aura, intractable, with status migrainosus 12/10/2019   Chronic daily headache 11/03/2019   Chronic upper extremity pain (2ry area of Pain) (Right) 10/30/2019   Lumbar degenerative disc disease 10/30/2019   Lumbar facet joint pain 10/30/2019   Lumbar radiculopathy 10/30/2019   Carpal tunnel syndrome of left wrist 04/04/2019   Carpal tunnel syndrome of right wrist 04/04/2019   Migraine 03/20/2019   Upper airway cough syndrome/ PNDS ? allergic rhinitis  03/01/2019   OSA on CPAP 03/01/2019   DOE (dyspnea on exertion) 02/28/2019   Tremor 02/26/2019   Bilateral carpal tunnel syndrome 02/13/2019   Radial styloid tenosynovitis of left hand 02/13/2019   Radial styloid tenosynovitis of right hand 02/13/2019   Obesity, Class  III, BMI 40-49.9 (morbid obesity) (HCC) 04/07/2018   Morbid obesity with BMI of 45.0-49.9, adult (HCC) 04/07/2018   Cognitive complaints 05/30/2017   PCOS (polycystic ovarian syndrome) 12/09/2015   Numbness and tingling in left hand 01/12/2015   Bilateral lower extremity pain 08/07/2014   Sleep related rhythmic movement disorder 07/13/2014   Acute pain of right shoulder 06/29/2014   Midline low back pain without sciatica 06/29/2014   Multiple joint pain 06/29/2014   Chronic knee pain  (4th area of Pain) (Bilateral) 06/29/2014   Orthostasis 11/27/2013   Gastritis, chronic 08/12/2013   Allergic rhinitis 01/27/2013   Vitamin D  deficiency 11/27/2012   Chest pain 09/12/2012    PCP: Sharma Coyer  REFERRING PROVIDER: Gust Molly, DO  REFERRING DIAG: [phone] (ICD-10-CM) - Chronic right shoulder pain  THERAPY DIAG:  Cervicalgia  Chronic right shoulder pain  Pain in right hip  Difficulty in walking, not elsewhere classified  Rationale for Evaluation and Treatment: Rehabilitation  ONSET DATE: Chronic issue, flared up the last 6 months  SUBJECTIVE:                                                                                                                                                                                                         SUBJECTIVE STATEMENT: Pt reports that she is fatigued this morning, noting that she was up at 2:30AM this morning with a migraine and felt like she was about to vomit.  Pt noted that she wasn't able to go back to sleep until around 8:30.  Hand dominance: Left  PERTINENT HISTORY:  Pt reports recent flare up of neck and RUE pain in the past 6 months. R sided neck pain. Neck pain travels down the outside of the arm mostly to the elbow. Occasionally pain down into the dorsum of hand and also the palmar side. Has tingling into her fingers (pointer, middle, and ring finger) with gripping tasks. Pt reports constant pain, mostly with picking up items. Does have migraines 24/7 and when she has a severe migraine, her RUE hurts but does not have same pain quality as what she was referred here for. Pain described as dull and achey. Denies pain provocation with cervical ranges of motion. Mostly with RUE motion. Pain does radiate down lateral aspect of arm described as jabbing. Pain relieved by Ibuprofen .   PAIN:  Are you having pain? Yes: NPRS scale: 4/10 NPS Pain location: R upper trap and mid humerus Pain  description: dull, achey, occasionally jabbing/sharp Aggravating factors: RUE use, picking items up overhead ROM Relieving factors: Ibuprofen , ice,  Tramadol   PRECAUTIONS: None  RED FLAGS: None, Bowel or bladder incontinence: No, and Cauda equina syndrome: No     WEIGHT BEARING RESTRICTIONS: No  FALLS:  Has patient fallen in last 6 months? No   OCCUPATION: unsure  PLOF: Independent  PATIENT GOALS: Less pain   NEXT MD VISIT: N/A  OBJECTIVE:  Note: Objective measures were completed at Evaluation unless otherwise noted.  DIAGNOSTIC FINDINGS:  Radiology Read:   Right Shoulder Xray and Right Humerus Xray on 09/10/2023 FINDINGS: There is no evidence of acute fracture or dislocation. There is no evidence of significant arthropathy. 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 Soft tissues are unremarkable.   IMPRESSION: No acute osseous abnormality.   Cervical Spine Xray on 04/19/2022 FINDINGS: There is no evidence of cervical spine fracture or prevertebral soft tissue swelling. Alignment is normal. No other significant bone abnormalities are identified.   IMPRESSION: Negative cervical spine radiographs.    PATIENT SURVEYS:  NDI:  NECK DISABILITY INDEX  Date: 01/09/24 Score  Pain intensity 2 = The pain is moderate at the moment  2. Personal care (washing, dressing, etc.) 1 =  I can look after myself normally but it causes extra pain  3. Lifting 3 = Pain prevents me from lifting heavy weights but I can manage light to medium   weights if they are conveniently positioned  4. Reading 3 = I can't read as much as I want because of moderate pain in my neck  5. Headaches 5 = I have headaches almost all the time  6. Concentration 1 =  I can concentrate fully when I want to with slight difficulty   7. Work 5 =  I can't do any work at all  8. Driving 2 =  I can drive my car as long as I want with moderate pain in my neck  9. Sleeping 1 = My sleep is slightly disturbed (less than 1 hr  sleepless)  10. Recreation 5 = I can't do any recreation activities at all  Total 28/50   Minimum Detectable Change (90% confidence): 5 points or 10% points Quick Dash:  QUICK DASH  Please rate your ability do the following activities in the last week by selecting the number below the appropriate response.   Activities Rating  Open a tight or new jar.  5 = Unable  Do heavy household chores (e.g., wash walls, floors). 4 = Severe difficulty  Carry a shopping bag or briefcase 4 = Severe difficulty  Wash your back. 3 = Moderate difficulty  Use a knife to cut food. 1 = No difficulty   Recreational activities in which you take some force or impact through your arm, shoulder or hand (e.g., golf, hammering, tennis, etc.). 5 = Unable  During the past week, to what extent has your arm, shoulder or hand problem interfered with your normal social activities with family, friends, neighbors or groups?  1 = Not at all  During the past week, were you limited in your work or other regular daily activities as a result of your arm, shoulder or hand problem? 1 = Not limited at all  Rate the severity of the following symptoms in the last week: Arm, Shoulder, or hand pain. 3 = Moderate  Rate the severity of the following symptoms in the last week: Tingling (pins and needles) in your arm, shoulder or hand. 1 = none  During the past week, how much difficulty have you had sleeping because of the pain in your arm, shoulder  or hand?  2 = Mild difficulty   (A QuickDASH score may not be calculated if there is greater than 1 missing item.)  Quick Dash Disability/Symptom Score: 43.2%  Minimally Clinically Important Difference (MCID): 15-20 points  Flavio, F. et al. (2013). Minimally clinically important difference of the disabilities of the arm, shoulder, and hand outcome measures (DASH) and its shortened version (Quick DASH). Journal of Orthopaedic & Sports Physical Therapy, 44(1), 30-39)    COGNITION: Overall cognitive status: Within functional limits for tasks assessed  SENSATION: WFL  POSTURE: rounded shoulders and forward head  PALPATION: TTP along upper trap on R side. TTP along R sided infraspinatus/teres minor/major and lats.   CERVICAL ROM:   Active ROM A/PROM (deg) eval  Flexion 40  Extension 55  Right lateral flexion 45  Left lateral flexion 45  Right rotation 55  Left rotation 40   (Blank rows = not tested)  UPPER EXTREMITY ROM:  Active ROM Right eval Left eval  Shoulder flexion 131* 150  Shoulder abduction 118* 130  Shoulder internal rotation WNL WNL  Shoulder external rotation WNL WNL   (Blank rows = not tested)  UPPER EXTREMITY MMT:  MMT Right eval Left eval  Shoulder flexion 4* 4  Shoulder abduction 4* 4  Shoulder internal rotation 5 5  Shoulder external rotation 5 5  Elbow flexion 5 5  Elbow extension 5 5  Wrist flexion 5 5  Wrist extension 5 5  Grip strength 5 5   (Blank rows = not tested)  CERVICAL SPECIAL TESTS:  Upper limb tension test (ULTT): Negative and Spurling's test: Negative  Radial, median, and ulnar*  SHOULDER SPECIAL TESTS: Neers: Positive on RUE Hawkins-Kennedy: Positive RUE  FUNCTIONAL TESTS:  Box lift: Deferred to next session  JOINT MOBILITY: C2-C7: Grossly hypomobile. Pt reports tenderness throughout. Does not appear to report concordant pain with CPA's.   TREATMENT DATE: 01/30/2024    Neuromuscular Re-education:  Seated Scapular Retraction with GTB, 2x10  Seated mid rows with 20#, 2x10   Seated lat pull downs, 20#, 2x10    Manual:   Seated STM to the extensor bundle for pain modulation and increased tissue extensibility  Seated STM to wrist flexor bundle for pain modulation and increased tissue extensibility  Passive stretch of the wrist in both flexion/extension in order to improve tissue extensibility and improve the overall pain that pt is experiencing in the elbow    Supine  radial AP mobilization, 30 sec bouts, Grade II, for reduction of medial pain of the elbow  Supine radial head PA mobilization, 30 sec bouts, Grade II, for reduction of lateral pain of the elbow  Supine elbow flexion with distraction for lateral elbow pain, 30 sec bouts   Reviewed HEP packet and addressed questions/concerns.       PATIENT EDUCATION:  Education details: HEP, Exercise Technique Person educated: Patient Education method: Explanation Education comprehension: verbalized understanding  HOME EXERCISE PROGRAM: Access Code: [email or web address] URL: [email or web address] Date: 01/21/2024 Prepared by: Lonni Pall  Exercises - Seated Scapular Retraction  - 1 x daily - 7 x weekly - 3 sets - 15 reps - Standing Shoulder Row with Anchored Resistance  - 1 x daily - 3-4 x weekly - 2-3 sets - 10 reps - Shoulder extension with resistance - Neutral  - 1 x daily - 3-4 x weekly - 2-3 sets - 10-12 reps - Wall Angels  - 1 x daily - 3-4 x weekly - 2-3 sets - 10-12 reps - Seated Upper Trapezius  Stretch  - 1 x daily - 7 x weekly - 3 sets - 30s hold  ASSESSMENT:  CLINICAL IMPRESSION:  Pt noted that she was having more pain in the elbow rather than the shoulder and neck, and noting it was the most problematic area and wants to focus on it.  Pt noted to have significant TP's in the flexor and extensor bundle of the wrist and was worked on significantly during the session.  Pt felt good relief at the end of the session, noting a 2/10 pain level compared to 4/10 upon arrival.  Pt advised to expect some soreness that over the next 24 hours, but should resolve soon.  Pt encouraged to let therapist know how it felt at the subsequent visit.   Pt will continue to benefit from skilled therapy to address remaining deficits in order to improve overall QoL and return to PLOF.     OBJECTIVE IMPAIRMENTS: decreased mobility, decreased ROM, decreased strength, hypomobility, impaired UE functional use,  postural dysfunction, obesity, and pain.   ACTIVITY LIMITATIONS: carrying, lifting, bathing, dressing, reach over head, and hygiene/grooming  PARTICIPATION LIMITATIONS: cleaning and occupation  PERSONAL FACTORS: Age, Fitness, Past/current experiences, Time since onset of injury/illness/exacerbation, and 3+ comorbidities: Migraines, PTSD, Seizures, chronic pain syndrome, DOE are also affecting patient's functional outcome.   REHAB POTENTIAL: Fair chronicity of symptoms, complex PMH  CLINICAL DECISION MAKING: Evolving/moderate complexity  EVALUATION COMPLEXITY: Moderate   GOALS: Goals reviewed with patient? No  SHORT TERM GOALS: Target date: 02/06/23  Pt will be independent with HEP to improve R cervical/shoulder ROM and pain for ADL completion. Baseline: 01/09/24: deferred to next session Goal status: INITIAL  LONG TERM GOALS: Target date: 03/06/23  Pt will improve cervical and R shoulder AROM in limited motions by at least 7 degrees to demonstrate clinically significant improvement in mobility for ADL completion.  Baseline: 01/09/24:    CERVICAL ROM:   Active ROM A/PROM (deg) eval  Flexion 40  Extension 55  Right lateral flexion 45  Left lateral flexion 45  Right rotation 55  Left rotation 40   (Blank rows = not tested)  UPPER EXTREMITY ROM:  Active ROM Right eval Left eval  Shoulder flexion 131* 150  Shoulder extension    Shoulder abduction 118* 130    Goal status: INITIAL  2.  Pt will improve Quick DASH by at least 15 points to demonstrate clinically significant improvement in RUE use.  Baseline: 01/09/24: 43.2% Goal status: INITIAL  3.  Pt will improve NDI by at least 5 points to demonstrate clinically significant improvement in disability due to neck pain.  Baseline: 01/09/24: 28/50 Goal status: INITIAL  4.  Pt will improve R shoulder flexion and abduction by at least 7 degrees in each plane to demonstrate clinically significant improvement in RUE use  for overhead ADL completion. Baseline: 01/09/24: flexion: 131 degrees, abduction: 118 degrees Goal status: INITIAL  PLAN:  PT FREQUENCY: 1-2x/week  PT DURATION: 8 weeks  PLANNED INTERVENTIONS: [id number]- PT Re-evaluation, 97750- Physical Performance Testing, 97110-Therapeutic exercises, 97530- Therapeutic activity, [id number]- Neuromuscular re-education, 97535- Self Care, [id number]- Manual therapy, [id number]- Aquatic Therapy, [id number]- Electrical stimulation (unattended), [id number]- Electrical stimulation (manual), 20560 (1-2 muscles), 20561 (3+ muscles)- Dry Needling, Patient/Family education, Joint mobilization, Spinal mobilization, Cryotherapy, and Moist heat  PLAN FOR NEXT SESSION:  Review HEP. Progress Cervical and R Shoulder mobility (AAROM). Improve cervical and R shoulder mobility. Progress periscapular strengthening (postural muscles)   Fonda Simpers, PT, DPT Physical Therapist - [place]  [facility]  01/30/2024, 11:57 AM  "

## 2024-02-04 ENCOUNTER — Telehealth: Payer: Self-pay

## 2024-02-04 ENCOUNTER — Ambulatory Visit

## 2024-02-04 DIAGNOSIS — G8929 Other chronic pain: Secondary | ICD-10-CM

## 2024-02-04 DIAGNOSIS — R262 Difficulty in walking, not elsewhere classified: Secondary | ICD-10-CM

## 2024-02-04 DIAGNOSIS — M25551 Pain in right hip: Secondary | ICD-10-CM

## 2024-02-04 DIAGNOSIS — M542 Cervicalgia: Secondary | ICD-10-CM | POA: Diagnosis not present

## 2024-02-04 NOTE — Therapy (Signed)
 " OUTPATIENT PHYSICAL THERAPY CERVICAL TREATMENT   Patient Name: Theresa Phillips MRN: [id number] DOB:1980/02/02, 44 y.o., female Today's Date: 02/05/2024  END OF SESSION:  PT End of Session - 02/04/24 1439     Visit Number 4    Number of Visits 17    Date for Recertification  03/05/24    PT Start Time 1439    PT Stop Time 1520    PT Time Calculation (min) 41 min    Activity Tolerance Patient tolerated treatment well    Behavior During Therapy [facility] for tasks assessed/performed          Past Medical History:  Diagnosis Date   Allergy     See list   Annual physical exam 07/18/2023   Anxiety 05/2014   Chronic pain syndrome    Clotting disorder    Jan/Feb 23   GERD (gastroesophageal reflux disease)    Migraines    Neuromuscular disorder (HCC)    PCOS (polycystic ovarian syndrome)    PTSD (post-traumatic stress disorder)    Seizures (HCC)    Sinusitis    Sleep apnea    7 years ago, CPAP   Vitamin D  deficiency    Past Surgical History:  Procedure Laterality Date   EXPLORATORY LAPAROTOMY     WISDOM TOOTH EXTRACTION     Patient Active Problem List   Diagnosis Date Noted   Neck pain of over 3 months duration 12/31/2023   Neck pain (Right) 12/31/2023   Neck and shoulder pain 12/31/2023   Chronic hip pain (3ry area of Pain) (Right) 12/31/2023   Osteoarthritis of hip (Right) 12/31/2023   Greater trochanteric bursitis (Right) 12/31/2023   Hip abductor tendinitis (Right) 12/31/2023   Osteoarthritis of knees (Bilateral) 12/31/2023   Osteoarthritis involving multiple joints 12/31/2023   Chronic lower extremity pain (5th area of Pain) (Bilateral) 12/31/2023   Migraine without aura and without status migrainosus, not intractable 12/30/2023   Chronic pain syndrome 12/30/2023   Pharmacologic therapy 12/30/2023   Disorder of skeletal system 12/30/2023   Problems influencing health status 12/30/2023   Annual physical exam 07/18/2023   Stress incontinence of urine 03/29/2023    Lumbago with sciatica, right side 03/15/2023   Allergic conjunctivitis and rhinitis, bilateral 03/14/2023   Fibromyalgia 11/09/2022   Seizure (HCC) 09/29/2022   Chronic neck pain (1ry area of Pain) (Right) 04/19/2022   Primary osteoarthritis of right hip, right hip trochanteric bursitis and abductor tendinitis 04/19/2022   Patellofemoral syndrome, bilateral 04/19/2022   Bilateral ankle pain 04/19/2022   Sensory disorder of trigeminal nerve 01/19/2021   Recurrent boils 12/01/2020   Chronic nausea 05/13/2020   Acanthosis nigricans 03/05/2020   Constipation 03/05/2020   Irritable bowel syndrome 03/05/2020   Sciatica 03/05/2020   Facet arthropathy, lumbar 02/17/2020   Chronic migraine without aura, intractable, with status migrainosus 12/10/2019   Chronic daily headache 11/03/2019   Chronic upper extremity pain (2ry area of Pain) (Right) 10/30/2019   Lumbar degenerative disc disease 10/30/2019   Lumbar facet joint pain 10/30/2019   Lumbar radiculopathy 10/30/2019   Carpal tunnel syndrome of left wrist 04/04/2019   Carpal tunnel syndrome of right wrist 04/04/2019   Migraine 03/20/2019   Upper airway cough syndrome/ PNDS ? allergic rhinitis  03/01/2019   OSA on CPAP 03/01/2019   DOE (dyspnea on exertion) 02/28/2019   Tremor 02/26/2019   Bilateral carpal tunnel syndrome 02/13/2019   Radial styloid tenosynovitis of left hand 02/13/2019   Radial styloid tenosynovitis of right hand 02/13/2019   Obesity,  Class III, BMI 40-49.9 (morbid obesity) (HCC) 04/07/2018   Morbid obesity with BMI of 45.0-49.9, adult (HCC) 04/07/2018   Cognitive complaints 05/30/2017   PCOS (polycystic ovarian syndrome) 12/09/2015   Numbness and tingling in left hand 01/12/2015   Bilateral lower extremity pain 08/07/2014   Sleep related rhythmic movement disorder 07/13/2014   Acute pain of right shoulder 06/29/2014   Midline low back pain without sciatica 06/29/2014   Multiple joint pain 06/29/2014   Chronic knee  pain (4th area of Pain) (Bilateral) 06/29/2014   Orthostasis 11/27/2013   Gastritis, chronic 08/12/2013   Allergic rhinitis 01/27/2013   Vitamin D  deficiency 11/27/2012   Chest pain 09/12/2012    PCP: Sharma Coyer  REFERRING PROVIDER: Gust Molly, DO  REFERRING DIAG: [phone] (ICD-10-CM) - Chronic right shoulder pain  THERAPY DIAG:  Cervicalgia  Chronic right shoulder pain  Pain in right hip  Difficulty in walking, not elsewhere classified  Rationale for Evaluation and Treatment: Rehabilitation  ONSET DATE: Chronic issue, flared up the last 6 months  SUBJECTIVE:                                                                                                                                                                                                         SUBJECTIVE STATEMENT:  Pt reports that the elbow pain is at a 1/10, but the neck/R UT region is at a strong 5/10 upon arrival.     Hand dominance: Left  PERTINENT HISTORY:  Pt reports recent flare up of neck and RUE pain in the past 6 months. R sided neck pain. Neck pain travels down the outside of the arm mostly to the elbow. Occasionally pain down into the dorsum of hand and also the palmar side. Has tingling into her fingers (pointer, middle, and ring finger) with gripping tasks. Pt reports constant pain, mostly with picking up items. Does have migraines 24/7 and when she has a severe migraine, her RUE hurts but does not have same pain quality as what she was referred here for. Pain described as dull and achey. Denies pain provocation with cervical ranges of motion. Mostly with RUE motion. Pain does radiate down lateral aspect of arm described as jabbing. Pain relieved by Ibuprofen .   PAIN:  Are you having pain? Yes: NPRS scale: 4/10 NPS Pain location: R upper trap and mid humerus Pain description: dull, achey, occasionally jabbing/sharp Aggravating factors: RUE use, picking items up overhead  ROM Relieving factors: Ibuprofen , ice, Tramadol   PRECAUTIONS: None  RED FLAGS: None, Bowel or bladder incontinence: No, and Cauda equina  syndrome: No     WEIGHT BEARING RESTRICTIONS: No  FALLS:  Has patient fallen in last 6 months? No   OCCUPATION: unsure  PLOF: Independent  PATIENT GOALS: Less pain   NEXT MD VISIT: N/A  OBJECTIVE:  Note: Objective measures were completed at Evaluation unless otherwise noted.  DIAGNOSTIC FINDINGS:  Radiology Read:   Right Shoulder Xray and Right Humerus Xray on 09/10/2023 FINDINGS: There is no evidence of acute fracture or dislocation. There is no evidence of significant arthropathy. 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 Soft tissues are unremarkable.   IMPRESSION: No acute osseous abnormality.   Cervical Spine Xray on 04/19/2022 FINDINGS: There is no evidence of cervical spine fracture or prevertebral soft tissue swelling. Alignment is normal. No other significant bone abnormalities are identified.   IMPRESSION: Negative cervical spine radiographs.    PATIENT SURVEYS:  NDI:  NECK DISABILITY INDEX  Date: 01/09/24 Score  Pain intensity 2 = The pain is moderate at the moment  2. Personal care (washing, dressing, etc.) 1 =  I can look after myself normally but it causes extra pain  3. Lifting 3 = Pain prevents me from lifting heavy weights but I can manage light to medium   weights if they are conveniently positioned  4. Reading 3 = I can't read as much as I want because of moderate pain in my neck  5. Headaches 5 = I have headaches almost all the time  6. Concentration 1 =  I can concentrate fully when I want to with slight difficulty   7. Work 5 =  I can't do any work at all  8. Driving 2 =  I can drive my car as long as I want with moderate pain in my neck  9. Sleeping 1 = My sleep is slightly disturbed (less than 1 hr sleepless)  10. Recreation 5 = I can't do any recreation activities at all  Total 28/50   Minimum Detectable  Change (90% confidence): 5 points or 10% points Quick Dash:  QUICK DASH  Please rate your ability do the following activities in the last week by selecting the number below the appropriate response.   Activities Rating  Open a tight or new jar.  5 = Unable  Do heavy household chores (e.g., wash walls, floors). 4 = Severe difficulty  Carry a shopping bag or briefcase 4 = Severe difficulty  Wash your back. 3 = Moderate difficulty  Use a knife to cut food. 1 = No difficulty   Recreational activities in which you take some force or impact through your arm, shoulder or hand (e.g., golf, hammering, tennis, etc.). 5 = Unable  During the past week, to what extent has your arm, shoulder or hand problem interfered with your normal social activities with family, friends, neighbors or groups?  1 = Not at all  During the past week, were you limited in your work or other regular daily activities as a result of your arm, shoulder or hand problem? 1 = Not limited at all  Rate the severity of the following symptoms in the last week: Arm, Shoulder, or hand pain. 3 = Moderate  Rate the severity of the following symptoms in the last week: Tingling (pins and needles) in your arm, shoulder or hand. 1 = none  During the past week, how much difficulty have you had sleeping because of the pain in your arm, shoulder or hand?  2 = Mild difficulty   (A QuickDASH score may not be calculated  if there is greater than 1 missing item.)  Quick Dash Disability/Symptom Score: 43.2%  Minimally Clinically Important Difference (MCID): 15-20 points  Flavio, F. et al. (2013). Minimally clinically important difference of the disabilities of the arm, shoulder, and hand outcome measures (DASH) and its shortened version (Quick DASH). Journal of Orthopaedic & Sports Physical Therapy, 44(1), 30-39)   COGNITION: Overall cognitive status: Within functional limits for tasks assessed  SENSATION: WFL  POSTURE: rounded  shoulders and forward head  PALPATION: TTP along upper trap on R side. TTP along R sided infraspinatus/teres minor/major and lats.   CERVICAL ROM:   Active ROM A/PROM (deg) eval  Flexion 40  Extension 55  Right lateral flexion 45  Left lateral flexion 45  Right rotation 55  Left rotation 40   (Blank rows = not tested)  UPPER EXTREMITY ROM:  Active ROM Right eval Left eval  Shoulder flexion 131* 150  Shoulder abduction 118* 130  Shoulder internal rotation WNL WNL  Shoulder external rotation WNL WNL   (Blank rows = not tested)  UPPER EXTREMITY MMT:  MMT Right eval Left eval  Shoulder flexion 4* 4  Shoulder abduction 4* 4  Shoulder internal rotation 5 5  Shoulder external rotation 5 5  Elbow flexion 5 5  Elbow extension 5 5  Wrist flexion 5 5  Wrist extension 5 5  Grip strength 5 5   (Blank rows = not tested)  CERVICAL SPECIAL TESTS:  Upper limb tension test (ULTT): Negative and Spurling's test: Negative  Radial, median, and ulnar*  SHOULDER SPECIAL TESTS: Neers: Positive on RUE Hawkins-Kennedy: Positive RUE  FUNCTIONAL TESTS:  Box lift: Deferred to next session  JOINT MOBILITY: C2-C7: Grossly hypomobile. Pt reports tenderness throughout. Does not appear to report concordant pain with CPA's.   TREATMENT DATE: 02/05/2024   Neuromuscular Re-education:  Standing wall angels with tactile feedback and visual demonstration, 2x10  Standing serratus rolls into forward flexion with foam roller, 2x10  Standing serratus lift offs, 2x10  Seated mid rows with 20#, 2x10   Seated lat pull downs, 20#, 2x10    Manual:  Supine STM to cervical region to increase extensibility of the paraspinals Supine suboccipital release technique to decrease cervicalgia Supine UT/Levator stretch, 30 sec bouts to increase tissue extensibility of the cervical region          PATIENT EDUCATION:  Education details: HEP, Exercise Technique Person educated:  Patient Education method: Explanation Education comprehension: verbalized understanding  HOME EXERCISE PROGRAM: Access Code: [email or web address] URL: [email or web address] Date: 01/21/2024 Prepared by: Lonni Pall  Exercises - Seated Scapular Retraction  - 1 x daily - 7 x weekly - 3 sets - 15 reps - Standing Shoulder Row with Anchored Resistance  - 1 x daily - 3-4 x weekly - 2-3 sets - 10 reps - Shoulder extension with resistance - Neutral  - 1 x daily - 3-4 x weekly - 2-3 sets - 10-12 reps - Wall Angels  - 1 x daily - 3-4 x weekly - 2-3 sets - 10-12 reps - Seated Upper Trapezius Stretch  - 1 x daily - 7 x weekly - 3 sets - 30s hold  ASSESSMENT:  CLINICAL IMPRESSION:  Pt responded favorably to the exercises and noted a significant reduction in overall elbow pian, so pt underwent exercises to introduce more scapular stabilizing exercises and stability in the shoulders.  Pt noted to feel less tense in the UT, however she was noting increased pain in the R UT, and focus was placed  on it during the session.   Pt will continue to benefit from skilled therapy to address remaining deficits in order to improve overall QoL and return to PLOF.      OBJECTIVE IMPAIRMENTS: decreased mobility, decreased ROM, decreased strength, hypomobility, impaired UE functional use, postural dysfunction, obesity, and pain.   ACTIVITY LIMITATIONS: carrying, lifting, bathing, dressing, reach over head, and hygiene/grooming  PARTICIPATION LIMITATIONS: cleaning and occupation  PERSONAL FACTORS: Age, Fitness, Past/current experiences, Time since onset of injury/illness/exacerbation, and 3+ comorbidities: Migraines, PTSD, Seizures, chronic pain syndrome, DOE are also affecting patient's functional outcome.   REHAB POTENTIAL: Fair chronicity of symptoms, complex PMH  CLINICAL DECISION MAKING: Evolving/moderate complexity  EVALUATION COMPLEXITY: Moderate   GOALS: Goals reviewed with patient? No  SHORT  TERM GOALS: Target date: 02/06/23  Pt will be independent with HEP to improve R cervical/shoulder ROM and pain for ADL completion. Baseline: 01/09/24: deferred to next session Goal status: INITIAL  LONG TERM GOALS: Target date: 03/06/23  Pt will improve cervical and R shoulder AROM in limited motions by at least 7 degrees to demonstrate clinically significant improvement in mobility for ADL completion.  Baseline: 01/09/24:    CERVICAL ROM:   Active ROM A/PROM (deg) eval  Flexion 40  Extension 55  Right lateral flexion 45  Left lateral flexion 45  Right rotation 55  Left rotation 40   (Blank rows = not tested)  UPPER EXTREMITY ROM:  Active ROM Right eval Left eval  Shoulder flexion 131* 150  Shoulder extension    Shoulder abduction 118* 130    Goal status: INITIAL  2.  Pt will improve Quick DASH by at least 15 points to demonstrate clinically significant improvement in RUE use.  Baseline: 01/09/24: 43.2% Goal status: INITIAL  3.  Pt will improve NDI by at least 5 points to demonstrate clinically significant improvement in disability due to neck pain.  Baseline: 01/09/24: 28/50 Goal status: INITIAL  4.  Pt will improve R shoulder flexion and abduction by at least 7 degrees in each plane to demonstrate clinically significant improvement in RUE use for overhead ADL completion. Baseline: 01/09/24: flexion: 131 degrees, abduction: 118 degrees Goal status: INITIAL  PLAN:  PT FREQUENCY: 1-2x/week  PT DURATION: 8 weeks  PLANNED INTERVENTIONS: [id number]- PT Re-evaluation, 97750- Physical Performance Testing, 97110-Therapeutic exercises, 97530- Therapeutic activity, [id number]- Neuromuscular re-education, 97535- Self Care, [id number]- Manual therapy, [id number]- Aquatic Therapy, H9716- Electrical stimulation (unattended), [id number]- Electrical stimulation (manual), 20560 (1-2 muscles), 20561 (3+ muscles)- Dry Needling, Patient/Family education, Joint mobilization, Spinal mobilization, Cryotherapy,  and Moist heat  PLAN FOR NEXT SESSION:   Review HEP. Progress Cervical and R Shoulder mobility (AAROM). Improve cervical and R shoulder mobility. Progress periscapular strengthening (postural muscles)   Fonda Simpers, PT, DPT Physical Therapist - [facility]  02/05/2024, 8:15 AM  "

## 2024-02-04 NOTE — Telephone Encounter (Signed)
 I called and talked to the pt. I advised pt that sam will be around to also see her on 1/30. She was ok with this plan

## 2024-02-04 NOTE — Telephone Encounter (Signed)
 Pt called needed to r/s with [facility]. Explained to pt that Gust is retiring early and need to get PA [place] or [place]. Pt asked to see Lucie. Was unable to put pt on [facility] [place] schedule due to block out. Please call pt about this matter pt prefers to see [facility]. Pt number is [phone].

## 2024-02-05 ENCOUNTER — Telehealth: Payer: Self-pay

## 2024-02-05 ENCOUNTER — Telehealth: Payer: Self-pay | Admitting: Family Medicine

## 2024-02-05 NOTE — Telephone Encounter (Signed)
 Copied from CRM #[id number]. Topic: Clinical - Lab/Test Results >> Feb 05, 2024 12:27 PM Nathanel BROCKS wrote: Reason for CRM: pt called and said that she had received a call about labs, I dont see anything in the system. Can you please call pt back and advise.

## 2024-02-05 NOTE — Telephone Encounter (Signed)
 Copied from CRM #[id number]. Topic: Clinical - Lab/Test Results >> Feb 05, 2024 12:27 PM Nathanel BROCKS wrote: Reason for CRM: pt called and said that she had received a call about labs, I dont see anything in the system. Can you please call pt back and advise. >> Feb 05, 2024  4:15 PM Hadassah PARAS wrote: Pt is calling returning missed call from [place], [place]. Please call pt back on #[phone]

## 2024-02-06 ENCOUNTER — Ambulatory Visit

## 2024-02-06 ENCOUNTER — Other Ambulatory Visit: Payer: Self-pay

## 2024-02-06 ENCOUNTER — Ambulatory Visit: Admitting: Pain Medicine

## 2024-02-06 DIAGNOSIS — G8929 Other chronic pain: Secondary | ICD-10-CM

## 2024-02-06 DIAGNOSIS — R058 Other specified cough: Secondary | ICD-10-CM

## 2024-02-06 DIAGNOSIS — R262 Difficulty in walking, not elsewhere classified: Secondary | ICD-10-CM

## 2024-02-06 DIAGNOSIS — M25551 Pain in right hip: Secondary | ICD-10-CM

## 2024-02-06 DIAGNOSIS — M542 Cervicalgia: Secondary | ICD-10-CM | POA: Diagnosis not present

## 2024-02-06 NOTE — Therapy (Signed)
 " OUTPATIENT PHYSICAL THERAPY CERVICAL TREATMENT   Patient Name: Markeeta Scalf MRN: [id number] DOB:[birth year], 44 y.o., female Today's Date: 02/06/2024  END OF SESSION:  PT End of Session - 02/06/24 1439     Visit Number 5    Number of Visits 17    Date for Recertification  03/05/24    PT Start Time 1439    PT Stop Time 1515    PT Time Calculation (min) 36 min    Activity Tolerance Patient tolerated treatment well    Behavior During Therapy [facility] for tasks assessed/performed           Past Medical History:  Diagnosis Date   Allergy     See list   Annual physical exam 07/18/2023   Anxiety 05/2014   Chronic pain syndrome    Clotting disorder    Jan/Feb 23   GERD (gastroesophageal reflux disease)    Migraines    Neuromuscular disorder (HCC)    PCOS (polycystic ovarian syndrome)    PTSD (post-traumatic stress disorder)    Seizures (HCC)    Sinusitis    Sleep apnea    7 years ago, CPAP   Vitamin D  deficiency    Past Surgical History:  Procedure Laterality Date   EXPLORATORY LAPAROTOMY     WISDOM TOOTH EXTRACTION     Patient Active Problem List   Diagnosis Date Noted   Neck pain of over 3 months duration 12/31/2023   Neck pain (Right) 12/31/2023   Neck and shoulder pain 12/31/2023   Chronic hip pain (3ry area of Pain) (Right) 12/31/2023   Osteoarthritis of hip (Right) 12/31/2023   Greater trochanteric bursitis (Right) 12/31/2023   Hip abductor tendinitis (Right) 12/31/2023   Osteoarthritis of knees (Bilateral) 12/31/2023   Osteoarthritis involving multiple joints 12/31/2023   Chronic lower extremity pain (5th area of Pain) (Bilateral) 12/31/2023   Migraine without aura and without status migrainosus, not intractable 12/30/2023   Chronic pain syndrome 12/30/2023   Pharmacologic therapy 12/30/2023   Disorder of skeletal system 12/30/2023   Problems influencing health status 12/30/2023   Annual physical exam 07/18/2023   Stress incontinence of urine 03/29/2023    Lumbago with sciatica, right side 03/15/2023   Allergic conjunctivitis and rhinitis, bilateral 03/14/2023   Fibromyalgia 11/09/2022   Seizure (HCC) 09/29/2022   Chronic neck pain (1ry area of Pain) (Right) 04/19/2022   Primary osteoarthritis of right hip, right hip trochanteric bursitis and abductor tendinitis 04/19/2022   Patellofemoral syndrome, bilateral 04/19/2022   Bilateral ankle pain 04/19/2022   Sensory disorder of trigeminal nerve 01/19/2021   Recurrent boils 12/01/2020   Chronic nausea 05/13/2020   Acanthosis nigricans 03/05/2020   Constipation 03/05/2020   Irritable bowel syndrome 03/05/2020   Sciatica 03/05/2020   Facet arthropathy, lumbar 02/17/2020   Chronic migraine without aura, intractable, with status migrainosus 12/10/2019   Chronic daily headache 11/03/2019   Chronic upper extremity pain (2ry area of Pain) (Right) 10/30/2019   Lumbar degenerative disc disease 10/30/2019   Lumbar facet joint pain 10/30/2019   Lumbar radiculopathy 10/30/2019   Carpal tunnel syndrome of left wrist 04/04/2019   Carpal tunnel syndrome of right wrist 04/04/2019   Migraine 03/20/2019   Upper airway cough syndrome/ PNDS ? allergic rhinitis  03/01/2019   OSA on CPAP 03/01/2019   DOE (dyspnea on exertion) 02/28/2019   Tremor 02/26/2019   Bilateral carpal tunnel syndrome 02/13/2019   Radial styloid tenosynovitis of left hand 02/13/2019   Radial styloid tenosynovitis of right hand 02/13/2019  Obesity, Class III, BMI 40-49.9 (morbid obesity) (HCC) 04/07/2018   Morbid obesity with BMI of 45.0-49.9, adult (HCC) 04/07/2018   Cognitive complaints 05/30/2017   PCOS (polycystic ovarian syndrome) 12/09/2015   Numbness and tingling in left hand 01/12/2015   Bilateral lower extremity pain 08/07/2014   Sleep related rhythmic movement disorder 07/13/2014   Acute pain of right shoulder 06/29/2014   Midline low back pain without sciatica 06/29/2014   Multiple joint pain 06/29/2014   Chronic knee  pain (4th area of Pain) (Bilateral) 06/29/2014   Orthostasis 11/27/2013   Gastritis, chronic 08/12/2013   Allergic rhinitis 01/27/2013   Vitamin D  deficiency 11/27/2012   Chest pain 09/12/2012    PCP: Sharma Coyer  REFERRING PROVIDER: Gust Molly, DO  REFERRING DIAG: [phone] (ICD-10-CM) - Chronic right shoulder pain  THERAPY DIAG:  Cervicalgia  Chronic right shoulder pain  Pain in right hip  Difficulty in walking, not elsewhere classified  Rationale for Evaluation and Treatment: Rehabilitation  ONSET DATE: Chronic issue, flared up the last 6 months  SUBJECTIVE:                                                                                                                                                                                                         SUBJECTIVE STATEMENT:  Pt reports she's doing ok, noting the shoulder is 3/10 today.  Pt notes that her exercises are going ok, but hasn't done them since being seen by therapist last.   Hand dominance: Left  PERTINENT HISTORY:  Pt reports recent flare up of neck and RUE pain in the past 6 months. R sided neck pain. Neck pain travels down the outside of the arm mostly to the elbow. Occasionally pain down into the dorsum of hand and also the palmar side. Has tingling into her fingers (pointer, middle, and ring finger) with gripping tasks. Pt reports constant pain, mostly with picking up items. Does have migraines 24/7 and when she has a severe migraine, her RUE hurts but does not have same pain quality as what she was referred here for. Pain described as dull and achey. Denies pain provocation with cervical ranges of motion. Mostly with RUE motion. Pain does radiate down lateral aspect of arm described as jabbing. Pain relieved by Ibuprofen .   PAIN:  Are you having pain? Yes: NPRS scale: 4/10 NPS Pain location: R upper trap and mid humerus Pain description: dull, achey, occasionally  jabbing/sharp Aggravating factors: RUE use, picking items up overhead ROM Relieving factors: Ibuprofen , ice, Tramadol   PRECAUTIONS: None  RED FLAGS: None, Bowel  or bladder incontinence: No, and Cauda equina syndrome: No     WEIGHT BEARING RESTRICTIONS: No  FALLS:  Has patient fallen in last 6 months? No   OCCUPATION: unsure  PLOF: Independent  PATIENT GOALS: Less pain   NEXT MD VISIT: N/A  OBJECTIVE:  Note: Objective measures were completed at Evaluation unless otherwise noted.  DIAGNOSTIC FINDINGS:  Radiology Read:   Right Shoulder Xray and Right Humerus Xray on 09/10/2023 FINDINGS: There is no evidence of acute fracture or dislocation. There is no evidence of significant arthropathy. 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 Soft tissues are unremarkable.   IMPRESSION: No acute osseous abnormality.   Cervical Spine Xray on 04/19/2022 FINDINGS: There is no evidence of cervical spine fracture or prevertebral soft tissue swelling. Alignment is normal. No other significant bone abnormalities are identified.   IMPRESSION: Negative cervical spine radiographs.    PATIENT SURVEYS:  NDI:  NECK DISABILITY INDEX  Date: 01/09/24 Score  Pain intensity 2 = The pain is moderate at the moment  2. Personal care (washing, dressing, etc.) 1 =  I can look after myself normally but it causes extra pain  3. Lifting 3 = Pain prevents me from lifting heavy weights but I can manage light to medium   weights if they are conveniently positioned  4. Reading 3 = I can't read as much as I want because of moderate pain in my neck  5. Headaches 5 = I have headaches almost all the time  6. Concentration 1 =  I can concentrate fully when I want to with slight difficulty   7. Work 5 =  I can't do any work at all  8. Driving 2 =  I can drive my car as long as I want with moderate pain in my neck  9. Sleeping 1 = My sleep is slightly disturbed (less than 1 hr sleepless)  10. Recreation 5 = I can't do any  recreation activities at all  Total 28/50   Minimum Detectable Change (90% confidence): 5 points or 10% points Quick Dash:  QUICK DASH  Please rate your ability do the following activities in the last week by selecting the number below the appropriate response.   Activities Rating  Open a tight or new jar.  5 = Unable  Do heavy household chores (e.g., wash walls, floors). 4 = Severe difficulty  Carry a shopping bag or briefcase 4 = Severe difficulty  Wash your back. 3 = Moderate difficulty  Use a knife to cut food. 1 = No difficulty   Recreational activities in which you take some force or impact through your arm, shoulder or hand (e.g., golf, hammering, tennis, etc.). 5 = Unable  During the past week, to what extent has your arm, shoulder or hand problem interfered with your normal social activities with family, friends, neighbors or groups?  1 = Not at all  During the past week, were you limited in your work or other regular daily activities as a result of your arm, shoulder or hand problem? 1 = Not limited at all  Rate the severity of the following symptoms in the last week: Arm, Shoulder, or hand pain. 3 = Moderate  Rate the severity of the following symptoms in the last week: Tingling (pins and needles) in your arm, shoulder or hand. 1 = none  During the past week, how much difficulty have you had sleeping because of the pain in your arm, shoulder or hand?  2 = Mild difficulty   (  A QuickDASH score may not be calculated if there is greater than 1 missing item.)  Quick Dash Disability/Symptom Score: 43.2%  Minimally Clinically Important Difference (MCID): 15-20 points  Flavio, F. et al. (2013). Minimally clinically important difference of the disabilities of the arm, shoulder, and hand outcome measures (DASH) and its shortened version (Quick DASH). Journal of Orthopaedic & Sports Physical Therapy, 44(1), 30-39)   COGNITION: Overall cognitive status: Within functional limits  for tasks assessed  SENSATION: WFL  POSTURE: rounded shoulders and forward head  PALPATION: TTP along upper trap on R side. TTP along R sided infraspinatus/teres minor/major and lats.   CERVICAL ROM:   Active ROM A/PROM (deg) eval  Flexion 40  Extension 55  Right lateral flexion 45  Left lateral flexion 45  Right rotation 55  Left rotation 40   (Blank rows = not tested)  UPPER EXTREMITY ROM:  Active ROM Right eval Left eval  Shoulder flexion 131* 150  Shoulder abduction 118* 130  Shoulder internal rotation WNL WNL  Shoulder external rotation WNL WNL   (Blank rows = not tested)  UPPER EXTREMITY MMT:  MMT Right eval Left eval  Shoulder flexion 4* 4  Shoulder abduction 4* 4  Shoulder internal rotation 5 5  Shoulder external rotation 5 5  Elbow flexion 5 5  Elbow extension 5 5  Wrist flexion 5 5  Wrist extension 5 5  Grip strength 5 5   (Blank rows = not tested)  CERVICAL SPECIAL TESTS:  Upper limb tension test (ULTT): Negative and Spurling's test: Negative  Radial, median, and ulnar*  SHOULDER SPECIAL TESTS: Neers: Positive on RUE Hawkins-Kennedy: Positive RUE  FUNCTIONAL TESTS:  Box lift: Deferred to next session  JOINT MOBILITY: C2-C7: Grossly hypomobile. Pt reports tenderness throughout. Does not appear to report concordant pain with CPA's.   TREATMENT DATE: 02/06/2024   Neuromuscular Re-education:  Standing wall angels with tactile feedback and visual demonstration, 2x10  Standing serratus rolls into forward flexion with foam roller, 2x10  Standing monster walks at wall with RTB, x10  Standing serratus lift offs, 2x10  Seated mid rows with 25#, 2x10   Seated lat pull downs, 25#, 2x10  Standing wall circles with green physioball, 30 sec bouts each UE, each direction CW/CCW   Manual:  Supine STM to cervical region, UT, to increase extensibility of the paraspinals Supine UT/Levator stretch, 30 sec bouts to increase tissue  extensibility of the cervical region          PATIENT EDUCATION:  Education details: HEP, Exercise Technique Person educated: Patient Education method: Explanation Education comprehension: verbalized understanding  HOME EXERCISE PROGRAM: Access Code: [email or web address] URL: [email or web address] Date: 01/21/2024 Prepared by: Lonni Pall  Exercises - Seated Scapular Retraction  - 1 x daily - 7 x weekly - 3 sets - 15 reps - Standing Shoulder Row with Anchored Resistance  - 1 x daily - 3-4 x weekly - 2-3 sets - 10 reps - Shoulder extension with resistance - Neutral  - 1 x daily - 3-4 x weekly - 2-3 sets - 10-12 reps - Wall Angels  - 1 x daily - 3-4 x weekly - 2-3 sets - 10-12 reps - Seated Upper Trapezius Stretch  - 1 x daily - 7 x weekly - 3 sets - 30s hold  ASSESSMENT:  CLINICAL IMPRESSION:  Pt arrived late to the scheduled appointment which limited therapy session.  Regardless, pt is making improvements in her overall pain levels, reporting a reduction in painful symptoms upon arrival.  Pt also was able to perform newly introduced exercises without any complications.  Pt advised to add monster walks to her current HEP with the use of the red band.   Pt will continue to benefit from skilled therapy to address remaining deficits in order to improve overall QoL and return to PLOF.       OBJECTIVE IMPAIRMENTS: decreased mobility, decreased ROM, decreased strength, hypomobility, impaired UE functional use, postural dysfunction, obesity, and pain.   ACTIVITY LIMITATIONS: carrying, lifting, bathing, dressing, reach over head, and hygiene/grooming  PARTICIPATION LIMITATIONS: cleaning and occupation  PERSONAL FACTORS: Age, Fitness, Past/current experiences, Time since onset of injury/illness/exacerbation, and 3+ comorbidities: Migraines, PTSD, Seizures, chronic pain syndrome, DOE are also affecting patient's functional outcome.   REHAB POTENTIAL: Fair chronicity of symptoms,  complex PMH  CLINICAL DECISION MAKING: Evolving/moderate complexity  EVALUATION COMPLEXITY: Moderate   GOALS: Goals reviewed with patient? No  SHORT TERM GOALS: Target date: 02/06/23  Pt will be independent with HEP to improve R cervical/shoulder ROM and pain for ADL completion. Baseline: 01/09/24: deferred to next session Goal status: INITIAL  LONG TERM GOALS: Target date: 03/06/23  Pt will improve cervical and R shoulder AROM in limited motions by at least 7 degrees to demonstrate clinically significant improvement in mobility for ADL completion.  Baseline: 01/09/24:    CERVICAL ROM:   Active ROM A/PROM (deg) eval  Flexion 40  Extension 55  Right lateral flexion 45  Left lateral flexion 45  Right rotation 55  Left rotation 40   (Blank rows = not tested)  UPPER EXTREMITY ROM:  Active ROM Right eval Left eval  Shoulder flexion 131* 150  Shoulder extension    Shoulder abduction 118* 130    Goal status: INITIAL  2.  Pt will improve Quick DASH by at least 15 points to demonstrate clinically significant improvement in RUE use.  Baseline: 01/09/24: 43.2% Goal status: INITIAL  3.  Pt will improve NDI by at least 5 points to demonstrate clinically significant improvement in disability due to neck pain.  Baseline: 01/09/24: 28/50 Goal status: INITIAL  4.  Pt will improve R shoulder flexion and abduction by at least 7 degrees in each plane to demonstrate clinically significant improvement in RUE use for overhead ADL completion. Baseline: 01/09/24: flexion: 131 degrees, abduction: 118 degrees Goal status: INITIAL  PLAN:  PT FREQUENCY: 1-2x/week  PT DURATION: 8 weeks  PLANNED INTERVENTIONS: [id number]- PT Re-evaluation, 97750- Physical Performance Testing, 97110-Therapeutic exercises, 97530- Therapeutic activity, [id number]- Neuromuscular re-education, 97535- Self Care, [id number]- Manual therapy, [id number]- Aquatic Therapy, [id number]- Electrical stimulation (unattended), [id number]-  Electrical stimulation (manual), 20560 (1-2 muscles), 20561 (3+ muscles)- Dry Needling, Patient/Family education, Joint mobilization, Spinal mobilization, Cryotherapy, and Moist heat  PLAN FOR NEXT SESSION:   Review HEP. Progress Cervical and R Shoulder mobility (AAROM). Improve cervical and R shoulder mobility. Progress periscapular strengthening (postural muscles)   Fonda Simpers, PT, DPT Physical Therapist - [facility]  02/06/2024, 3:17 PM  "

## 2024-02-06 NOTE — Telephone Encounter (Signed)
 Copied from CRM [id number]. Topic: Clinical - Medical Advice >> Feb 06, 2024  8:32 AM Tobias CROME wrote: Reason for CRM: Patient states she has lingering cough from getting sick in November. Patient denies SOB, or fevers. Patient states she is coughing daily.   Patient states there are no more refills on cough syrup,promethazine -dextromethorphan (PROMETHAZINE -DM) 6.25-15 MG/5ML syrup and needing refill.   Patient also states if a prior authorization is sent for cough syrup and tessalon  pearls, she would be able to get the medication for $4.00.   Best callback number: [phone]

## 2024-02-11 ENCOUNTER — Ambulatory Visit

## 2024-02-11 NOTE — Therapy (Incomplete)
 " OUTPATIENT PHYSICAL THERAPY CERVICAL TREATMENT   Patient Name: Theresa Phillips MRN: [id number] DOB:[birth year], 44 y.o., female Today's Date: 02/11/2024  END OF SESSION:     Past Medical History:  Diagnosis Date   Allergy     See list   Annual physical exam 07/18/2023   Anxiety 05/2014   Chronic pain syndrome    Clotting disorder    Jan/Feb 23   GERD (gastroesophageal reflux disease)    Migraines    Neuromuscular disorder (HCC)    PCOS (polycystic ovarian syndrome)    PTSD (post-traumatic stress disorder)    Seizures (HCC)    Sinusitis    Sleep apnea    7 years ago, CPAP   Vitamin D  deficiency    Past Surgical History:  Procedure Laterality Date   EXPLORATORY LAPAROTOMY     WISDOM TOOTH EXTRACTION     Patient Active Problem List   Diagnosis Date Noted   Neck pain of over 3 months duration 12/31/2023   Neck pain (Right) 12/31/2023   Neck and shoulder pain 12/31/2023   Chronic hip pain (3ry area of Pain) (Right) 12/31/2023   Osteoarthritis of hip (Right) 12/31/2023   Greater trochanteric bursitis (Right) 12/31/2023   Hip abductor tendinitis (Right) 12/31/2023   Osteoarthritis of knees (Bilateral) 12/31/2023   Osteoarthritis involving multiple joints 12/31/2023   Chronic lower extremity pain (5th area of Pain) (Bilateral) 12/31/2023   Migraine without aura and without status migrainosus, not intractable 12/30/2023   Chronic pain syndrome 12/30/2023   Pharmacologic therapy 12/30/2023   Disorder of skeletal system 12/30/2023   Problems influencing health status 12/30/2023   Annual physical exam 07/18/2023   Stress incontinence of urine 03/29/2023   Lumbago with sciatica, right side 03/15/2023   Allergic conjunctivitis and rhinitis, bilateral 03/14/2023   Fibromyalgia 11/09/2022   Seizure (HCC) 09/29/2022   Chronic neck pain (1ry area of Pain) (Right) 04/19/2022   Primary osteoarthritis of right hip, right hip trochanteric bursitis and abductor tendinitis 04/19/2022    Patellofemoral syndrome, bilateral 04/19/2022   Bilateral ankle pain 04/19/2022   Sensory disorder of trigeminal nerve 01/19/2021   Recurrent boils 12/01/2020   Chronic nausea 05/13/2020   Acanthosis nigricans 03/05/2020   Constipation 03/05/2020   Irritable bowel syndrome 03/05/2020   Sciatica 03/05/2020   Facet arthropathy, lumbar 02/17/2020   Chronic migraine without aura, intractable, with status migrainosus 12/10/2019   Chronic daily headache 11/03/2019   Chronic upper extremity pain (2ry area of Pain) (Right) 10/30/2019   Lumbar degenerative disc disease 10/30/2019   Lumbar facet joint pain 10/30/2019   Lumbar radiculopathy 10/30/2019   Carpal tunnel syndrome of left wrist 04/04/2019   Carpal tunnel syndrome of right wrist 04/04/2019   Migraine 03/20/2019   Upper airway cough syndrome/ PNDS ? allergic rhinitis  03/01/2019   OSA on CPAP 03/01/2019   DOE (dyspnea on exertion) 02/28/2019   Tremor 02/26/2019   Bilateral carpal tunnel syndrome 02/13/2019   Radial styloid tenosynovitis of left hand 02/13/2019   Radial styloid tenosynovitis of right hand 02/13/2019   Obesity, Class III, BMI 40-49.9 (morbid obesity) (HCC) 04/07/2018   Morbid obesity with BMI of 45.0-49.9, adult (HCC) 04/07/2018   Cognitive complaints 05/30/2017   PCOS (polycystic ovarian syndrome) 12/09/2015   Numbness and tingling in left hand 01/12/2015   Bilateral lower extremity pain 08/07/2014   Sleep related rhythmic movement disorder 07/13/2014   Acute pain of right shoulder 06/29/2014   Midline low back pain without sciatica 06/29/2014   Multiple joint pain  06/29/2014   Chronic knee pain (4th area of Pain) (Bilateral) 06/29/2014   Orthostasis 11/27/2013   Gastritis, chronic 08/12/2013   Allergic rhinitis 01/27/2013   Vitamin D  deficiency 11/27/2012   Chest pain 09/12/2012    PCP: Sharma Coyer  REFERRING PROVIDER: Gust Molly, DO  REFERRING DIAG: [phone] (ICD-10-CM) - Chronic  right shoulder pain  THERAPY DIAG:  No diagnosis found.  Rationale for Evaluation and Treatment: Rehabilitation  ONSET DATE: Chronic issue, flared up the last 6 months  SUBJECTIVE:                                                                                                                                                                                                         SUBJECTIVE STATEMENT:  ***  Hand dominance: Left  PERTINENT HISTORY:  Pt reports recent flare up of neck and RUE pain in the past 6 months. R sided neck pain. Neck pain travels down the outside of the arm mostly to the elbow. Occasionally pain down into the dorsum of hand and also the palmar side. Has tingling into her fingers (pointer, middle, and ring finger) with gripping tasks. Pt reports constant pain, mostly with picking up items. Does have migraines 24/7 and when she has a severe migraine, her RUE hurts but does not have same pain quality as what she was referred here for. Pain described as dull and achey. Denies pain provocation with cervical ranges of motion. Mostly with RUE motion. Pain does radiate down lateral aspect of arm described as jabbing. Pain relieved by Ibuprofen .   PAIN:  Are you having pain? Yes: NPRS scale: 4/10 NPS Pain location: R upper trap and mid humerus Pain description: dull, achey, occasionally jabbing/sharp Aggravating factors: RUE use, picking items up overhead ROM Relieving factors: Ibuprofen , ice, Tramadol   PRECAUTIONS: None  RED FLAGS: None, Bowel or bladder incontinence: No, and Cauda equina syndrome: No     WEIGHT BEARING RESTRICTIONS: No  FALLS:  Has patient fallen in last 6 months? No   OCCUPATION: unsure  PLOF: Independent  PATIENT GOALS: Less pain   NEXT MD VISIT: N/A  OBJECTIVE:  Note: Objective measures were completed at Evaluation unless otherwise noted.  DIAGNOSTIC FINDINGS:  Radiology Read:   Right Shoulder Xray and Right Humerus Xray on  09/10/2023 FINDINGS: There is no evidence of acute fracture or dislocation. There is no evidence of significant arthropathy. 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 Soft tissues are unremarkable.   IMPRESSION: No acute osseous abnormality.   Cervical Spine Xray on 04/19/2022 FINDINGS: There is no evidence of cervical  spine fracture or prevertebral soft tissue swelling. Alignment is normal. No other significant bone abnormalities are identified.   IMPRESSION: Negative cervical spine radiographs.    PATIENT SURVEYS:  NDI:  NECK DISABILITY INDEX  Date: 01/09/24 Score  Pain intensity 2 = The pain is moderate at the moment  2. Personal care (washing, dressing, etc.) 1 =  I can look after myself normally but it causes extra pain  3. Lifting 3 = Pain prevents me from lifting heavy weights but I can manage light to medium   weights if they are conveniently positioned  4. Reading 3 = I can't read as much as I want because of moderate pain in my neck  5. Headaches 5 = I have headaches almost all the time  6. Concentration 1 =  I can concentrate fully when I want to with slight difficulty   7. Work 5 =  I can't do any work at all  8. Driving 2 =  I can drive my car as long as I want with moderate pain in my neck  9. Sleeping 1 = My sleep is slightly disturbed (less than 1 hr sleepless)  10. Recreation 5 = I can't do any recreation activities at all  Total 28/50   Minimum Detectable Change (90% confidence): 5 points or 10% points Quick Dash:  QUICK DASH  Please rate your ability do the following activities in the last week by selecting the number below the appropriate response.   Activities Rating  Open a tight or new jar.  5 = Unable  Do heavy household chores (e.g., wash walls, floors). 4 = Severe difficulty  Carry a shopping bag or briefcase 4 = Severe difficulty  Wash your back. 3 = Moderate difficulty  Use a knife to cut food. 1 = No difficulty   Recreational activities in which you take  some force or impact through your arm, shoulder or hand (e.g., golf, hammering, tennis, etc.). 5 = Unable  During the past week, to what extent has your arm, shoulder or hand problem interfered with your normal social activities with family, friends, neighbors or groups?  1 = Not at all  During the past week, were you limited in your work or other regular daily activities as a result of your arm, shoulder or hand problem? 1 = Not limited at all  Rate the severity of the following symptoms in the last week: Arm, Shoulder, or hand pain. 3 = Moderate  Rate the severity of the following symptoms in the last week: Tingling (pins and needles) in your arm, shoulder or hand. 1 = none  During the past week, how much difficulty have you had sleeping because of the pain in your arm, shoulder or hand?  2 = Mild difficulty   (A QuickDASH score may not be calculated if there is greater than 1 missing item.)  Quick Dash Disability/Symptom Score: 43.2%  Minimally Clinically Important Difference (MCID): 15-20 points  Flavio, F. et al. (2013). Minimally clinically important difference of the disabilities of the arm, shoulder, and hand outcome measures (DASH) and its shortened version (Quick DASH). Journal of Orthopaedic & Sports Physical Therapy, 44(1), 30-39)   COGNITION: Overall cognitive status: Within functional limits for tasks assessed  SENSATION: WFL  POSTURE: rounded shoulders and forward head  PALPATION: TTP along upper trap on R side. TTP along R sided infraspinatus/teres minor/major and lats.   CERVICAL ROM:   Active ROM A/PROM (deg) eval  Flexion 40  Extension 55  Right lateral flexion 45  Left lateral flexion 45  Right rotation 55  Left rotation 40   (Blank rows = not tested)  UPPER EXTREMITY ROM:  Active ROM Right eval Left eval  Shoulder flexion 131* 150  Shoulder abduction 118* 130  Shoulder internal rotation WNL WNL  Shoulder external rotation WNL WNL   (Blank  rows = not tested)  UPPER EXTREMITY MMT:  MMT Right eval Left eval  Shoulder flexion 4* 4  Shoulder abduction 4* 4  Shoulder internal rotation 5 5  Shoulder external rotation 5 5  Elbow flexion 5 5  Elbow extension 5 5  Wrist flexion 5 5  Wrist extension 5 5  Grip strength 5 5   (Blank rows = not tested)  CERVICAL SPECIAL TESTS:  Upper limb tension test (ULTT): Negative and Spurling's test: Negative  Radial, median, and ulnar*  SHOULDER SPECIAL TESTS: Neers: Positive on RUE Hawkins-Kennedy: Positive RUE  FUNCTIONAL TESTS:  Box lift: Deferred to next session  JOINT MOBILITY: C2-C7: Grossly hypomobile. Pt reports tenderness throughout. Does not appear to report concordant pain with CPA's.   TREATMENT DATE: 02/11/24  ***  Neuromuscular Re-education:  Standing wall angels with tactile feedback and visual demonstration, 2x10  Standing serratus rolls into forward flexion with foam roller, 2x10  Standing monster walks at wall with RTB, x10  Standing serratus lift offs, 2x10  Seated mid rows with 25#, 2x10   Seated lat pull downs, 25#, 2x10  Standing wall circles with green physioball, 30 sec bouts each UE, each direction CW/CCW   Manual:  Supine STM to cervical region, UT, to increase extensibility of the paraspinals Supine UT/Levator stretch, 30 sec bouts to increase tissue extensibility of the cervical region          PATIENT EDUCATION:  Education details: HEP, Exercise Technique Person educated: Patient Education method: Explanation Education comprehension: verbalized understanding  HOME EXERCISE PROGRAM: Access Code: [email or web address] URL: [email or web address] Date: 01/21/2024 Prepared by: Lonni Pall  Exercises - Seated Scapular Retraction  - 1 x daily - 7 x weekly - 3 sets - 15 reps - Standing Shoulder Row with Anchored Resistance  - 1 x daily - 3-4 x weekly - 2-3 sets - 10 reps - Shoulder extension with resistance - Neutral  - 1 x  daily - 3-4 x weekly - 2-3 sets - 10-12 reps - Wall Angels  - 1 x daily - 3-4 x weekly - 2-3 sets - 10-12 reps - Seated Upper Trapezius Stretch  - 1 x daily - 7 x weekly - 3 sets - 30s hold  ASSESSMENT:  CLINICAL IMPRESSION:  ***     OBJECTIVE IMPAIRMENTS: decreased mobility, decreased ROM, decreased strength, hypomobility, impaired UE functional use, postural dysfunction, obesity, and pain.   ACTIVITY LIMITATIONS: carrying, lifting, bathing, dressing, reach over head, and hygiene/grooming  PARTICIPATION LIMITATIONS: cleaning and occupation  PERSONAL FACTORS: Age, Fitness, Past/current experiences, Time since onset of injury/illness/exacerbation, and 3+ comorbidities: Migraines, PTSD, Seizures, chronic pain syndrome, DOE are also affecting patient's functional outcome.   REHAB POTENTIAL: Fair chronicity of symptoms, complex PMH  CLINICAL DECISION MAKING: Evolving/moderate complexity  EVALUATION COMPLEXITY: Moderate   GOALS: Goals reviewed with patient? No  SHORT TERM GOALS: Target date: 02/06/23  Pt will be independent with HEP to improve R cervical/shoulder ROM and pain for ADL completion. Baseline: 01/09/24: deferred to next session Goal status: INITIAL  LONG TERM GOALS: Target date: 03/06/23  Pt will improve cervical and R shoulder AROM in limited motions by at  least 7 degrees to demonstrate clinically significant improvement in mobility for ADL completion.  Baseline: 01/09/24:    CERVICAL ROM:   Active ROM A/PROM (deg) eval  Flexion 40  Extension 55  Right lateral flexion 45  Left lateral flexion 45  Right rotation 55  Left rotation 40   (Blank rows = not tested)  UPPER EXTREMITY ROM:  Active ROM Right eval Left eval  Shoulder flexion 131* 150  Shoulder extension    Shoulder abduction 118* 130    Goal status: INITIAL  2.  Pt will improve Quick DASH by at least 15 points to demonstrate clinically significant improvement in RUE use.  Baseline:  01/09/24: 43.2% Goal status: INITIAL  3.  Pt will improve NDI by at least 5 points to demonstrate clinically significant improvement in disability due to neck pain.  Baseline: 01/09/24: 28/50 Goal status: INITIAL  4.  Pt will improve R shoulder flexion and abduction by at least 7 degrees in each plane to demonstrate clinically significant improvement in RUE use for overhead ADL completion. Baseline: 01/09/24: flexion: 131 degrees, abduction: 118 degrees Goal status: INITIAL  PLAN:  PT FREQUENCY: 1-2x/week  PT DURATION: 8 weeks  PLANNED INTERVENTIONS: 97164- PT Re-evaluation, 97750- Physical Performance Testing, 97110-Therapeutic exercises, 97530- Therapeutic activity, [id number]- Neuromuscular re-education, 97535- Self Care, [id number]- Manual therapy, [id number]- Aquatic Therapy, H9716- Electrical stimulation (unattended), [id number]- Electrical stimulation (manual), 20560 (1-2 muscles), 20561 (3+ muscles)- Dry Needling, Patient/Family education, Joint mobilization, Spinal mobilization, Cryotherapy, and Moist heat  PLAN FOR NEXT SESSION:   ***  Review HEP. Progress Cervical and R Shoulder mobility (AAROM). Improve cervical and R shoulder mobility. Progress periscapular strengthening (postural muscles)   Fonda Simpers, PT, DPT Physical Therapist - [facility]  02/11/24, 12:32 PM  "

## 2024-02-12 ENCOUNTER — Ambulatory Visit: Payer: Self-pay

## 2024-02-12 NOTE — Telephone Encounter (Signed)
 FYI Only or Action Required?: FYI only for provider: appointment scheduled on 02/13/24.  Patient was last seen in primary care on 01/09/2024 by Glenard Mire, MD.  Called Nurse Triage reporting Hand Injury.  Symptoms began several days ago.  Interventions attempted: OTC medications: Ibuprofen .  Symptoms are: gradually worsening.  Triage Disposition: See PCP When Office is Open (Within 3 Days)  Patient/caregiver understands and will follow disposition?: Yes  Reason for Disposition  [1] After 3 days AND [2] pain not improved  Answer Assessment - Initial Assessment Questions Patient states that she smashed the 3 middle fingers of her left hand in a glass shower door on Saturday. She has been experiencing swelling, pain, and tenderness since, but does feel that the pain has worsened.She has taken Ibuprofen  with mild relief. Office visit advised. Patient is also inquiring about refills on Promethazine  DM and benzonatate  (TESSALON ). She states she has contacted the office 2 times previously in regards to these refills and has not heard anything back. Would also like to inform PCP that she has a lingering cough from illness in November.   1. MECHANISM: How did the injury happen?      Closed shower door on 3 middle fingers  2. ONSET: When did the injury happen? (e.g., minutes, hours ago)      Saturday  3. LOCATION: What part of the finger is injured? Is the nail damaged?      Around knuckles  4. APPEARANCE of the INJURY: What does the injury look like?      Mild swelling, redness, one small cut on 1 finger   5. SEVERITY: Can you use the hand normally?  Can you bend your fingers into a ball and then fully open them?     Able to but it's extremely painful  6. SIZE: For cuts, bruises, or swelling, ask: How large is it? (e.g., inches or centimeters;  entire finger)      Small cut, mild swelling  7. PAIN: Is there pain? If Yes, ask: How bad is the pain?  (Scale 0-10;  or none, mild, moderate, severe)     Currently 3/10, but very tender and can get up to 12/10 with movement or if hand brushes up against an object  8. TETANUS: For any breaks in the skin, ask: When was your last tetanus booster?     Last month  9. OTHER SYMPTOMS: Do you have any other symptoms?     tenderness  10. PREGNANCY: Is there any chance you are pregnant? When was your last menstrual period?       No  Protocols used: Finger Injury-A-AH  Reason for Triage: Lt hand- pain

## 2024-02-13 ENCOUNTER — Ambulatory Visit: Admitting: Nurse Practitioner

## 2024-02-13 ENCOUNTER — Encounter: Payer: Self-pay | Admitting: Nurse Practitioner

## 2024-02-13 ENCOUNTER — Other Ambulatory Visit (HOSPITAL_COMMUNITY): Payer: Self-pay

## 2024-02-13 VITALS — BP 138/86 | HR 89 | Temp 98.0°F | Ht 63.0 in | Wt 267.0 lb

## 2024-02-13 DIAGNOSIS — S60222A Contusion of left hand, initial encounter: Secondary | ICD-10-CM | POA: Insufficient documentation

## 2024-02-13 DIAGNOSIS — M79642 Pain in left hand: Secondary | ICD-10-CM | POA: Diagnosis not present

## 2024-02-13 MED ORDER — BENZONATATE 200 MG PO CAPS: 200.0000 mg | ORAL_CAPSULE | Freq: Two times a day (BID) | ORAL | 2 refills | Status: DC | PRN

## 2024-02-13 MED ORDER — PROMETHAZINE-DM 6.25-15 MG/5ML PO SYRP: 5.0000 mL | ORAL_SOLUTION | Freq: Four times a day (QID) | ORAL | 0 refills | Status: DC | PRN

## 2024-02-13 NOTE — Addendum Note (Signed)
 Addended by: CHERRY CHIQUITA HERO on: 02/13/2024 04:50 PM   Modules accepted: Orders

## 2024-02-13 NOTE — Telephone Encounter (Signed)
 Insurance will not cover the cough meds, she would be responsible for these costs out of pocket

## 2024-02-13 NOTE — Telephone Encounter (Signed)
 She needs to follow up with Dr. Lang regarding chronic cough.

## 2024-02-13 NOTE — Progress Notes (Signed)
 "  BP 138/86   Pulse 89   Temp 98 F (36.7 C)   Ht 5' 3 (1.6 m)   Wt 267 lb (121.1 kg)   SpO2 96%   BMI 47.30 kg/m    Subjective:    Patient ID: Theresa Phillips, female    DOB: 12-04-1980, 44 y.o.   MRN: [id number]  HPI: Theresa Phillips is a 44 y.o. female  Chief Complaint  Patient presents with   Hand Pain    Pt c/o finger pain on left hand after window was shut on it.    Discussed the use of AI scribe software for clinical note transcription with the patient, who gave verbal consent to proceed.  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  Left hand trauma - Injury occurred Saturday morning when left hand was caught in a sliding glass door at a hotel - Door slammed quickly and bounced back, causing injury to fingers - No prior diagnostic imaging or orthopedic evaluation since injury  Pain characteristics - Severe pain in left hand, particularly affecting three fingers - Pain radiates towards the wrist - Pain is tender and exacerbated by touch or slight contact with objects - Initially no wrist pain, but later pain radiated towards the wrist - Pain interferes with daily activities and impedes ability to perform tasks - Described as 'extremely painful' and 'frustrating'  Laceration - Laceration present on one finger - Bleeding from laceration took approximately five minutes to stop despite application of ice  Symptom management - Pain managed with ice and unspecified medications         01/09/2024    2:55 PM 12/31/2023    2:32 PM 08/08/2023    1:58 PM  Depression screen PHQ 2/9  Decreased Interest 0 0 0  Down, Depressed, Hopeless 0 0 0  PHQ - 2 Score 0 0 0  Altered sleeping   0  Tired, decreased energy   0  Change in appetite   0  Feeling bad or failure about yourself    0  Trouble concentrating   0  Moving slowly or fidgety/restless   0  Suicidal thoughts   0   PHQ-9 Score   0   Difficult doing work/chores   Not difficult at all     Data saved with a previous flowsheet row definition    Relevant past medical, surgical, family and social history reviewed and updated as indicated. Interim medical history since our last visit reviewed. Allergies and medications reviewed and updated.  Review of Systems  Ten systems reviewed and is negative except as mentioned in HPI      Objective:      BP 138/86   Pulse 89   Temp 98 F (36.7 C)   Ht 5' 3 (1.6 m)   Wt 267 lb (121.1 kg)   SpO2 96%   BMI 47.30 kg/m    Wt Readings from Last 3 Encounters:  02/13/24 267 lb (121.1 kg)  01/09/24 267 lb 11.2 oz (121.4 kg)  12/31/23 274 lb (124.3 kg)    Physical Exam GENERAL: Alert, cooperative, well developed, no acute distress. HEENT: Normocephalic, normal oropharynx, moist mucous membranes. CHEST: Clear to auscultation bilaterally, no wheezes, rhonchi, or crackles. CARDIOVASCULAR: Normal heart rate and rhythm, S1 and S2 normal without murmurs. ABDOMEN: Soft, non-tender, non-distended, without organomegaly, normal bowel sounds. EXTREMITIES: Tenderness in left hand and wrist.  Swelling noted,  No cyanosis NEUROLOGICAL: Cranial nerves grossly intact, moves all extremities without gross motor or sensory deficit.  Results for orders placed or performed in visit on 12/31/23  Magnesium   Collection Time: 12/31/23  4:02 PM  Result Value Ref Range   Magnesium 2.3 1.6 - 2.3 mg/dL  Vitamin B12   Collection Time: 12/31/23  4:02 PM  Result Value Ref Range   Vitamin B-12 577 232 - 1,245 pg/mL  Sedimentation rate   Collection Time: 12/31/23  4:02 PM  Result Value Ref Range   Sed Rate 62 (H) 0 - 32 mm/hr  C-reactive protein   Collection Time: 12/31/23  4:02 PM  Result Value Ref Range   CRP 17 (H) 0 - 10 mg/L  Compliance Drug Analysis, Ur   Collection Time: 12/31/23  4:04 PM  Result Value Ref Range   Summary FINAL           Assessment & Plan:    Problem List Items Addressed This Visit   None Visit Diagnoses       Pain of left hand    -  Primary        Assessment and Plan Assessment & Plan Acute left hand injury Sustained from a fast slam in a sliding glass door on Saturday morning. Pain is radiating and tender, with difficulty in finger rotation. A skin break on one finger took approximately five minutes to stop bleeding. The injury is impeding daily activities and causing significant pain. Differential diagnosis includes possible fracture, though movement is possible. Swelling may obscure fractures on x-ray. - Recommended visiting [facility] for immediate evaluation and x-rays. - Instructed to send a message via MyChart with the outcome of the evaluation.        Follow up plan: Return if symptoms worsen or fail to improve. "

## 2024-02-13 NOTE — Addendum Note (Signed)
 Addended by: CHERRY CHIQUITA HERO on: 02/13/2024 04:48 PM   Modules accepted: Orders

## 2024-02-14 ENCOUNTER — Ambulatory Visit

## 2024-02-14 ENCOUNTER — Other Ambulatory Visit: Payer: Self-pay | Admitting: Family Medicine

## 2024-02-14 DIAGNOSIS — R058 Other specified cough: Secondary | ICD-10-CM

## 2024-02-14 MED ORDER — PROMETHAZINE-DM 6.25-15 MG/5ML PO SYRP: 5.0000 mL | ORAL_SOLUTION | Freq: Four times a day (QID) | ORAL | 4 refills | Status: AC | PRN

## 2024-02-14 MED ORDER — BENZONATATE 200 MG PO CAPS: 200.0000 mg | ORAL_CAPSULE | Freq: Two times a day (BID) | ORAL | 5 refills | Status: AC | PRN

## 2024-02-14 NOTE — Telephone Encounter (Signed)
 Prescriptions will be resubmitted via separate encounter.   Attempted to request PA for medications, PA team advised that these medications would not be covered by insurance and pt would need to pay out of pocket costs

## 2024-02-18 ENCOUNTER — Ambulatory Visit: Admitting: Pain Medicine

## 2024-02-19 ENCOUNTER — Ambulatory Visit

## 2024-02-21 ENCOUNTER — Ambulatory Visit

## 2024-02-21 ENCOUNTER — Ambulatory Visit (HOSPITAL_BASED_OUTPATIENT_CLINIC_OR_DEPARTMENT_OTHER): Admitting: Pain Medicine

## 2024-02-21 DIAGNOSIS — R7982 Elevated C-reactive protein (CRP): Secondary | ICD-10-CM | POA: Insufficient documentation

## 2024-02-21 DIAGNOSIS — G8929 Other chronic pain: Secondary | ICD-10-CM

## 2024-02-21 DIAGNOSIS — R700 Elevated erythrocyte sedimentation rate: Secondary | ICD-10-CM | POA: Insufficient documentation

## 2024-02-21 DIAGNOSIS — M47817 Spondylosis without myelopathy or radiculopathy, lumbosacral region: Secondary | ICD-10-CM | POA: Insufficient documentation

## 2024-02-21 DIAGNOSIS — Z91199 Patient's noncompliance with other medical treatment and regimen due to unspecified reason: Secondary | ICD-10-CM

## 2024-02-21 DIAGNOSIS — M47812 Spondylosis without myelopathy or radiculopathy, cervical region: Secondary | ICD-10-CM | POA: Insufficient documentation

## 2024-02-21 NOTE — Progress Notes (Addendum)
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 MG tablet, Take 1 tablet (25 mg total) by mouth every 6 (six) hours as needed for nausea or vomiting., Disp: 30 tablet, Rfl: 0   promethazine  (PHENERGAN ) 25 MG tablet, Take 1 tablet (25 mg total) by mouth every 8 (eight) hours as needed for nausea or vomiting., Disp: 90 tablet, Rfl: 1   promethazine -dextromethorphan (PROMETHAZINE -DM) 6.25-15  MG/5ML syrup, Take 5 mLs by mouth 4 (four) times daily as needed., Disp: 118 mL, Rfl: 4   rizatriptan  (MAXALT -MLT) 10 MG disintegrating tablet, Take 1 tablet (10 mg total) by mouth as needed for migraine. May repeat in 2 hours if needed. Max dose 2 pills in 24 hours, Disp: 9 tablet, Rfl: 11   traMADol  (ULTRAM ) 50 MG tablet, Take 1 tablet (50 mg total) by mouth every 8 (eight) hours as needed., Disp: 30 tablet, Rfl: 1   TRULANCE 3 MG TABS, Take by mouth., Disp: , Rfl:    Vitamin D , Ergocalciferol , (DRISDOL ) 1.25 MG (50000 UNIT) CAPS capsule, TAKE 1 CAPSULE (50,000 UNITS TOTAL) BY MOUTH EVERY 7 (SEVEN) DAYS, Disp: 12 capsule, Rfl: 1

## 2024-02-21 NOTE — Patient Instructions (Addendum)
 Theresa Phillips

## 2024-02-21 NOTE — Therapy (Incomplete)
 " OUTPATIENT PHYSICAL THERAPY CERVICAL TREATMENT   Patient Name: Theresa Phillips MRN: [id number] DOB:29-Jun-1980, 44 y.o., female Today's Date: 02/21/2024  END OF SESSION:     Past Medical History:  Diagnosis Date   Allergy     See list   Annual physical exam 07/18/2023   Anxiety 05/2014   Chronic pain syndrome    Clotting disorder    Jan/Feb 23   GERD (gastroesophageal reflux disease)    Migraines    Neuromuscular disorder (HCC)    PCOS (polycystic ovarian syndrome)    PTSD (post-traumatic stress disorder)    Seizures (HCC)    Sinusitis    Sleep apnea    7 years ago, CPAP   Vitamin D  deficiency    Past Surgical History:  Procedure Laterality Date   EXPLORATORY LAPAROTOMY     WISDOM TOOTH EXTRACTION     Patient Active Problem List   Diagnosis Date Noted   Spondylosis without myelopathy or radiculopathy, cervical region 02/21/2024   Spondylosis without myelopathy or radiculopathy, lumbosacral region 02/21/2024   Elevated sed rate 02/21/2024   Elevated C-reactive protein (CRP) 02/21/2024   Contusion of left hand 02/13/2024   Pain of left hand 02/13/2024   Atypical facial pain 01/09/2024   Neck pain of over 3 months duration 12/31/2023   Neck pain (Right) 12/31/2023   Neck and shoulder pain 12/31/2023   Chronic hip pain (3ry area of Pain) (Right) 12/31/2023   Osteoarthritis of hip (Right) 12/31/2023   Greater trochanteric bursitis (Right) 12/31/2023   Hip abductor tendinitis (Right) 12/31/2023   Osteoarthritis of knees (Bilateral) 12/31/2023   Osteoarthritis involving multiple joints 12/31/2023   Chronic lower extremity pain (5th area of Pain) (Bilateral) 12/31/2023   Migraine without aura and without status migrainosus, not intractable 12/30/2023   Chronic pain syndrome 12/30/2023   Pharmacologic therapy 12/30/2023   Disorder of skeletal system 12/30/2023   Problems influencing health status 12/30/2023   Annual physical exam 07/18/2023   Stress incontinence of urine  03/29/2023   Lumbago with sciatica, right side 03/15/2023   Allergic conjunctivitis and rhinitis, bilateral 03/14/2023   Fibromyalgia 11/09/2022   Seizure (HCC) 09/29/2022   Chronic neck pain (1ry area of Pain) (Right) 04/19/2022   Primary osteoarthritis of right hip, right hip trochanteric bursitis and abductor tendinitis 04/19/2022   Patellofemoral syndrome, bilateral 04/19/2022   Bilateral ankle pain 04/19/2022   Sensory disorder of trigeminal nerve 01/19/2021   Recurrent boils 12/01/2020   Chronic nausea 05/13/2020   Acanthosis nigricans 03/05/2020   Constipation 03/05/2020   Irritable bowel syndrome 03/05/2020   Sciatica 03/05/2020   Facet arthropathy, lumbar 02/17/2020   Chronic migraine without aura, intractable, with status migrainosus 12/10/2019   Chronic daily headache 11/03/2019   Chronic upper extremity pain (2ry area of Pain) (Right) 10/30/2019   Lumbar degenerative disc disease 10/30/2019   Lumbar facet joint pain 10/30/2019   Lumbar radiculopathy 10/30/2019   Carpal tunnel syndrome of left wrist 04/04/2019   Carpal tunnel syndrome of right wrist 04/04/2019   Migraine 03/20/2019   Upper airway cough syndrome/ PNDS ? allergic rhinitis  03/01/2019   OSA on CPAP 03/01/2019   DOE (dyspnea on exertion) 02/28/2019   Tremor 02/26/2019   Bilateral carpal tunnel syndrome 02/13/2019   Radial styloid tenosynovitis of left hand 02/13/2019   Radial styloid tenosynovitis of right hand 02/13/2019   Obesity, Class III, BMI 40-49.9 (morbid obesity) (HCC) 04/07/2018   Morbid obesity with BMI of 45.0-49.9, adult (HCC) 04/07/2018   Cognitive complaints 05/30/2017  PCOS (polycystic ovarian syndrome) 12/09/2015   Numbness and tingling in left hand 01/12/2015   Bilateral lower extremity pain 08/07/2014   Sleep related rhythmic movement disorder 07/13/2014   Acute pain of right shoulder 06/29/2014   Midline low back pain without sciatica 06/29/2014   Multiple joint pain 06/29/2014    Chronic knee pain (4th area of Pain) (Bilateral) 06/29/2014   Orthostasis 11/27/2013   Gastritis, chronic 08/12/2013   Allergic rhinitis 01/27/2013   Vitamin D  deficiency 11/27/2012   Chest pain 09/12/2012    PCP: Sharma Coyer  REFERRING PROVIDER: Gust Molly, DO  REFERRING DIAG: [phone] (ICD-10-CM) - Chronic right shoulder pain  THERAPY DIAG:  No diagnosis found.  Rationale for Evaluation and Treatment: Rehabilitation  ONSET DATE: Chronic issue, flared up the last 6 months  SUBJECTIVE:                                                                                                                                                                                                         SUBJECTIVE STATEMENT:  ***  Hand dominance: Left  PERTINENT HISTORY:  Pt reports recent flare up of neck and RUE pain in the past 6 months. R sided neck pain. Neck pain travels down the outside of the arm mostly to the elbow. Occasionally pain down into the dorsum of hand and also the palmar side. Has tingling into her fingers (pointer, middle, and ring finger) with gripping tasks. Pt reports constant pain, mostly with picking up items. Does have migraines 24/7 and when she has a severe migraine, her RUE hurts but does not have same pain quality as what she was referred here for. Pain described as dull and achey. Denies pain provocation with cervical ranges of motion. Mostly with RUE motion. Pain does radiate down lateral aspect of arm described as jabbing. Pain relieved by Ibuprofen .   PAIN:  Are you having pain? Yes: NPRS scale: 4/10 NPS Pain location: R upper trap and mid humerus Pain description: dull, achey, occasionally jabbing/sharp Aggravating factors: RUE use, picking items up overhead ROM Relieving factors: Ibuprofen , ice, Tramadol   PRECAUTIONS: None  RED FLAGS: None, Bowel or bladder incontinence: No, and Cauda equina syndrome: No     WEIGHT BEARING  RESTRICTIONS: No  FALLS:  Has patient fallen in last 6 months? No   OCCUPATION: unsure  PLOF: Independent  PATIENT GOALS: Less pain   NEXT MD VISIT: N/A  OBJECTIVE:  Note: Objective measures were completed at Evaluation unless otherwise noted.  DIAGNOSTIC FINDINGS:  Radiology Read:   Right Shoulder Xray and Right  Humerus Xray on 09/10/2023 FINDINGS: There is no evidence of acute fracture or dislocation. There is no evidence of significant arthropathy. 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 Soft tissues are unremarkable.   IMPRESSION: No acute osseous abnormality.   Cervical Spine Xray on 04/19/2022 FINDINGS: There is no evidence of cervical spine fracture or prevertebral soft tissue swelling. Alignment is normal. No other significant bone abnormalities are identified.   IMPRESSION: Negative cervical spine radiographs.    PATIENT SURVEYS:  NDI:  NECK DISABILITY INDEX  Date: 01/09/24 Score  Pain intensity 2 = The pain is moderate at the moment  2. Personal care (washing, dressing, etc.) 1 =  I can look after myself normally but it causes extra pain  3. Lifting 3 = Pain prevents me from lifting heavy weights but I can manage light to medium   weights if they are conveniently positioned  4. Reading 3 = I can't read as much as I want because of moderate pain in my neck  5. Headaches 5 = I have headaches almost all the time  6. Concentration 1 =  I can concentrate fully when I want to with slight difficulty   7. Work 5 =  I can't do any work at all  8. Driving 2 =  I can drive my car as long as I want with moderate pain in my neck  9. Sleeping 1 = My sleep is slightly disturbed (less than 1 hr sleepless)  10. Recreation 5 = I can't do any recreation activities at all  Total 28/50   Minimum Detectable Change (90% confidence): 5 points or 10% points Quick Dash:  QUICK DASH  Please rate your ability do the following activities in the last week by selecting the number below the  appropriate response.   Activities Rating  Open a tight or new jar.  5 = Unable  Do heavy household chores (e.g., wash walls, floors). 4 = Severe difficulty  Carry a shopping bag or briefcase 4 = Severe difficulty  Wash your back. 3 = Moderate difficulty  Use a knife to cut food. 1 = No difficulty   Recreational activities in which you take some force or impact through your arm, shoulder or hand (e.g., golf, hammering, tennis, etc.). 5 = Unable  During the past week, to what extent has your arm, shoulder or hand problem interfered with your normal social activities with family, friends, neighbors or groups?  1 = Not at all  During the past week, were you limited in your work or other regular daily activities as a result of your arm, shoulder or hand problem? 1 = Not limited at all  Rate the severity of the following symptoms in the last week: Arm, Shoulder, or hand pain. 3 = Moderate  Rate the severity of the following symptoms in the last week: Tingling (pins and needles) in your arm, shoulder or hand. 1 = none  During the past week, how much difficulty have you had sleeping because of the pain in your arm, shoulder or hand?  2 = Mild difficulty   (A QuickDASH score may not be calculated if there is greater than 1 missing item.)  Quick Dash Disability/Symptom Score: 43.2%  Minimally Clinically Important Difference (MCID): 15-20 points  Flavio, F. et al. (2013). Minimally clinically important difference of the disabilities of the arm, shoulder, and hand outcome measures (DASH) and its shortened version (Quick DASH). Journal of Orthopaedic & Sports Physical Therapy, 44(1), 30-39)   COGNITION: Overall cognitive status:  Within functional limits for tasks assessed  SENSATION: WFL  POSTURE: rounded shoulders and forward head  PALPATION: TTP along upper trap on R side. TTP along R sided infraspinatus/teres minor/major and lats.   CERVICAL ROM:   Active ROM A/PROM (deg) eval   Flexion 40  Extension 55  Right lateral flexion 45  Left lateral flexion 45  Right rotation 55  Left rotation 40   (Blank rows = not tested)  UPPER EXTREMITY ROM:  Active ROM Right eval Left eval  Shoulder flexion 131* 150  Shoulder abduction 118* 130  Shoulder internal rotation WNL WNL  Shoulder external rotation WNL WNL   (Blank rows = not tested)  UPPER EXTREMITY MMT:  MMT Right eval Left eval  Shoulder flexion 4* 4  Shoulder abduction 4* 4  Shoulder internal rotation 5 5  Shoulder external rotation 5 5  Elbow flexion 5 5  Elbow extension 5 5  Wrist flexion 5 5  Wrist extension 5 5  Grip strength 5 5   (Blank rows = not tested)  CERVICAL SPECIAL TESTS:  Upper limb tension test (ULTT): Negative and Spurling's test: Negative  Radial, median, and ulnar*  SHOULDER SPECIAL TESTS: Neers: Positive on RUE Hawkins-Kennedy: Positive RUE  FUNCTIONAL TESTS:  Box lift: Deferred to next session  JOINT MOBILITY: C2-C7: Grossly hypomobile. Pt reports tenderness throughout. Does not appear to report concordant pain with CPA's.   TREATMENT DATE: 02/21/24  ***  Neuromuscular Re-education:  Standing wall angels with tactile feedback and visual demonstration, 2x10  Standing serratus rolls into forward flexion with foam roller, 2x10  Standing monster walks at wall with RTB, x10  Standing serratus lift offs, 2x10  Seated mid rows with 25#, 2x10   Seated lat pull downs, 25#, 2x10  Standing wall circles with green physioball, 30 sec bouts each UE, each direction CW/CCW   Manual:  Supine STM to cervical region, UT, to increase extensibility of the paraspinals Supine UT/Levator stretch, 30 sec bouts to increase tissue extensibility of the cervical region          PATIENT EDUCATION:  Education details: HEP, Exercise Technique Person educated: Patient Education method: Explanation Education comprehension: verbalized understanding  HOME EXERCISE  PROGRAM: Access Code: [email or web address] URL: [email or web address] Date: 01/21/2024 Prepared by: Lonni Pall  Exercises - Seated Scapular Retraction  - 1 x daily - 7 x weekly - 3 sets - 15 reps - Standing Shoulder Row with Anchored Resistance  - 1 x daily - 3-4 x weekly - 2-3 sets - 10 reps - Shoulder extension with resistance - Neutral  - 1 x daily - 3-4 x weekly - 2-3 sets - 10-12 reps - Wall Angels  - 1 x daily - 3-4 x weekly - 2-3 sets - 10-12 reps - Seated Upper Trapezius Stretch  - 1 x daily - 7 x weekly - 3 sets - 30s hold  ASSESSMENT:  CLINICAL IMPRESSION:  ***     OBJECTIVE IMPAIRMENTS: decreased mobility, decreased ROM, decreased strength, hypomobility, impaired UE functional use, postural dysfunction, obesity, and pain.   ACTIVITY LIMITATIONS: carrying, lifting, bathing, dressing, reach over head, and hygiene/grooming  PARTICIPATION LIMITATIONS: cleaning and occupation  PERSONAL FACTORS: Age, Fitness, Past/current experiences, Time since onset of injury/illness/exacerbation, and 3+ comorbidities: Migraines, PTSD, Seizures, chronic pain syndrome, DOE are also affecting patient's functional outcome.   REHAB POTENTIAL: Fair chronicity of symptoms, complex PMH  CLINICAL DECISION MAKING: Evolving/moderate complexity  EVALUATION COMPLEXITY: Moderate   GOALS: Goals reviewed with patient? No  SHORT TERM  GOALS: Target date: 02/06/23  Pt will be independent with HEP to improve R cervical/shoulder ROM and pain for ADL completion. Baseline: 01/09/24: deferred to next session Goal status: INITIAL  LONG TERM GOALS: Target date: 03/06/23  Pt will improve cervical and R shoulder AROM in limited motions by at least 7 degrees to demonstrate clinically significant improvement in mobility for ADL completion.  Baseline: 01/09/24:    CERVICAL ROM:   Active ROM A/PROM (deg) eval  Flexion 40  Extension 55  Right lateral flexion 45  Left lateral flexion 45  Right  rotation 55  Left rotation 40   (Blank rows = not tested)  UPPER EXTREMITY ROM:  Active ROM Right eval Left eval  Shoulder flexion 131* 150  Shoulder extension    Shoulder abduction 118* 130    Goal status: INITIAL  2.  Pt will improve Quick DASH by at least 15 points to demonstrate clinically significant improvement in RUE use.  Baseline: 01/09/24: 43.2% Goal status: INITIAL  3.  Pt will improve NDI by at least 5 points to demonstrate clinically significant improvement in disability due to neck pain.  Baseline: 01/09/24: 28/50 Goal status: INITIAL  4.  Pt will improve R shoulder flexion and abduction by at least 7 degrees in each plane to demonstrate clinically significant improvement in RUE use for overhead ADL completion. Baseline: 01/09/24: flexion: 131 degrees, abduction: 118 degrees Goal status: INITIAL  PLAN:  PT FREQUENCY: 1-2x/week  PT DURATION: 8 weeks  PLANNED INTERVENTIONS: [id number]- PT Re-evaluation, 97750- Physical Performance Testing, 97110-Therapeutic exercises, 97530- Therapeutic activity, [id number]- Neuromuscular re-education, 97535- Self Care, [id number]- Manual therapy, [id number]- Aquatic Therapy, [id number]- Electrical stimulation (unattended), [id number]- Electrical stimulation (manual), 20560 (1-2 muscles), [id number] (3+ muscles)- Dry Needling, Patient/Family education, Joint mobilization, Spinal mobilization, Cryotherapy, and Moist heat  PLAN FOR NEXT SESSION:   ***  Review HEP. Progress Cervical and R Shoulder mobility (AAROM). Improve cervical and R shoulder mobility. Progress periscapular strengthening (postural muscles)   Fonda Simpers, PT, DPT Physical Therapist - [facility]  02/21/24, 1:35 PM  "

## 2024-02-22 ENCOUNTER — Ambulatory Visit

## 2024-03-12 ENCOUNTER — Ambulatory Visit: Admitting: Pain Medicine

## 2024-06-30 ENCOUNTER — Ambulatory Visit: Admitting: Family Medicine

## 2024-08-07 ENCOUNTER — Ambulatory Visit: Admitting: Family Medicine
